# Patient Record
Sex: Male | Born: 1955 | Race: White | Hispanic: No | Marital: Single | Smoking: Never smoker
Health system: Southern US, Community
[De-identification: ages and names within clinical notes are randomized; demographics above are authoritative.]

## PROBLEM LIST (undated history)

## (undated) DIAGNOSIS — I255 Ischemic cardiomyopathy: Secondary | ICD-10-CM

## (undated) DIAGNOSIS — R319 Hematuria, unspecified: Secondary | ICD-10-CM

## (undated) DIAGNOSIS — I5022 Chronic systolic (congestive) heart failure: Secondary | ICD-10-CM

## (undated) DIAGNOSIS — R059 Cough, unspecified: Secondary | ICD-10-CM

## (undated) DIAGNOSIS — C801 Malignant (primary) neoplasm, unspecified: Secondary | ICD-10-CM

## (undated) DIAGNOSIS — I2510 Atherosclerotic heart disease of native coronary artery without angina pectoris: Secondary | ICD-10-CM

## (undated) DIAGNOSIS — E785 Hyperlipidemia, unspecified: Secondary | ICD-10-CM

## (undated) DIAGNOSIS — R05 Cough: Secondary | ICD-10-CM

## (undated) DIAGNOSIS — R911 Solitary pulmonary nodule: Secondary | ICD-10-CM

## (undated) HISTORY — PX: PORTACATH PLACEMENT: SHX2246

## (undated) HISTORY — PX: COLOSTOMY: SHX63

## (undated) HISTORY — PX: COLON SURGERY: SHX602

## (undated) HISTORY — DX: Malignant (primary) neoplasm, unspecified: C80.1

## (undated) HISTORY — DX: Cough: R05

## (undated) HISTORY — DX: Atherosclerotic heart disease of native coronary artery without angina pectoris: I25.10

## (undated) HISTORY — PX: OTHER SURGICAL HISTORY: SHX169

## (undated) HISTORY — DX: Hematuria, unspecified: R31.9

## (undated) HISTORY — PX: PROCTOSCOPY: SHX2266

## (undated) HISTORY — DX: Cough, unspecified: R05.9

## (undated) HISTORY — DX: Hyperlipidemia, unspecified: E78.5

---

## 2005-08-23 ENCOUNTER — Ambulatory Visit: Payer: Self-pay | Admitting: Cardiology

## 2005-09-06 ENCOUNTER — Ambulatory Visit: Payer: Self-pay

## 2005-10-03 ENCOUNTER — Encounter: Payer: Self-pay | Admitting: Cardiology

## 2005-10-03 ENCOUNTER — Ambulatory Visit: Payer: Self-pay

## 2005-10-10 ENCOUNTER — Ambulatory Visit: Payer: Self-pay | Admitting: Cardiology

## 2005-10-17 ENCOUNTER — Ambulatory Visit: Payer: Self-pay | Admitting: Cardiology

## 2010-02-15 ENCOUNTER — Inpatient Hospital Stay (HOSPITAL_COMMUNITY)
Admission: EM | Admit: 2010-02-15 | Discharge: 2010-03-01 | Payer: Self-pay | Source: Home / Self Care | Attending: Internal Medicine | Admitting: Internal Medicine

## 2010-02-15 ENCOUNTER — Ambulatory Visit: Payer: Self-pay | Admitting: Internal Medicine

## 2010-02-15 ENCOUNTER — Ambulatory Visit: Payer: Self-pay | Admitting: Cardiology

## 2010-02-17 ENCOUNTER — Encounter: Payer: Self-pay | Admitting: Internal Medicine

## 2010-02-18 ENCOUNTER — Ambulatory Visit: Payer: Self-pay | Admitting: Hematology & Oncology

## 2010-02-25 ENCOUNTER — Inpatient Hospital Stay (HOSPITAL_COMMUNITY)
Admission: EM | Admit: 2010-02-25 | Discharge: 2010-03-01 | Payer: Self-pay | Source: Home / Self Care | Attending: Internal Medicine | Admitting: Internal Medicine

## 2010-03-07 ENCOUNTER — Ambulatory Visit
Admission: RE | Admit: 2010-03-07 | Discharge: 2010-03-31 | Payer: Self-pay | Source: Home / Self Care | Attending: Radiation Oncology | Admitting: Radiation Oncology

## 2010-03-10 ENCOUNTER — Encounter: Payer: Self-pay | Admitting: Internal Medicine

## 2010-04-11 ENCOUNTER — Ambulatory Visit
Admission: RE | Admit: 2010-04-11 | Discharge: 2010-05-03 | Payer: Self-pay | Source: Home / Self Care | Attending: Radiation Oncology | Admitting: Radiation Oncology

## 2010-04-15 ENCOUNTER — Encounter: Payer: Self-pay | Admitting: Cardiology

## 2010-04-23 ENCOUNTER — Encounter: Payer: Self-pay | Admitting: Hematology & Oncology

## 2010-04-23 ENCOUNTER — Encounter: Payer: Self-pay | Admitting: Internal Medicine

## 2010-05-03 NOTE — Procedures (Signed)
Summary: Colonoscopy  Patient: Adam Henderson Note: All result statuses are Final unless otherwise noted.  Tests: (1) Colonoscopy (COL)   COL Colonoscopy           DONE     [facility]     [address]     [place], [place]  [address]           COLONOSCOPY PROCEDURE REPORT           PATIENT:  Adam Henderson, Adam Henderson  MR#:  [id number]     BIRTHDATE:  [birth year], 54 yrs. old  GENDER:  male     ENDOSCOPIST:  [name]. [name], MD     REF. BY:  [name], M.D.     PROCEDURE DATE:  02/17/2010     PROCEDURE:  Colonoscopy 27253     ASA CLASS:  Class III     INDICATIONS:  Iron deficiency anemia severe Iron deficieny, Hgb     3.0, heme positive stool     MEDICATIONS:   Versed 5 mg, Fentanyl 50 mcg           DESCRIPTION OF PROCEDURE:   After the risks benefits and     alternatives of the procedure were thoroughly explained, informed     consent was obtained.  Digital rectal exam was performed and     revealed rectal mass.   The EC-[id number] ([id number]) endoscope was     introduced through the anus and advanced to the cecum, which was     identified by both the appendix and ileocecal valve, without     limitations.  The quality of the prep was good, using MiraLax.     The instrument was then slowly withdrawn as the colon was fully     examined.     <<PROCEDUREIMAGES>>           FINDINGS:  A mass was found. large ulcerated circumferential mass,     covered with foul smelling exudate, extending from the rectum to     10 cm, no obstruction Multiple biopsies were obtained and sent to     pathology (see image004, image005, image007, image006, and     image008).  This was otherwise a normal examination of the colon.     normal ileocecal valve and colon mucosa Multiple biopsies were     obtained and sent to pathology (see image003, image002, and     image001). random colon biopsies, also biopsy ileocecal valve     Retroflexed views in the rectum revealed no other findings other     than  those already described.    The scope was then withdrawn from     the patient and the procedure completed.           COMPLICATIONS:  None     ENDOSCOPIC IMPRESSION:     1) Mass     2) Otherwise normal examination     3) No other findings other than those already described.     large ulcerated rectal mass c/w carcinoma, no obstruction     RECOMMENDATIONS:     1) Await biopsy results     see chatr for plan of care, CT scan of the abd/pelvis,     Onc/Radiation consult     REPEAT EXAM:  In 1 year(s) for.           ______________________________     [name]. [name], MD           CC:  n.     eSIGNED:   [name]. Brodie at 02/17/2010 06:47 PM           Shearon Balo, [id number]  Note: An exclamation mark (!) indicates a result that was not dispersed into the flowsheet. Document Creation Date: 02/17/2010 6:48 PM _______________________________________________________________________  (1) Order result status: Final Collection or observation date-time: 02/17/2010 18:41 Requested date-time:  Receipt date-time:  Reported date-time:  Referring Physician:   Ordering Physician: Lina Sar [phone]) Specimen Source:  Source: Launa Grill Order Number: [id number] Lab site:

## 2010-05-03 NOTE — Procedures (Signed)
Summary: Upper Endoscopy  Patient: Adam Henderson Note: All result statuses are Final unless otherwise noted.  Tests: (1) Upper Endoscopy (EGD)   EGD Upper Endoscopy       DONE     [facility]     [address]     [place], [place]  [address]           ENDOSCOPY PROCEDURE REPORT           PATIENT:  Adam Henderson, Adam Henderson  MR#:  [id number]     BIRTHDATE:  11-03-1955, 54 yrs. old  GENDER:  male           ENDOSCOPIST:  [name]. [name], MD     Referred by:  [name], M.D.           PROCEDURE DATE:  02/17/2010     PROCEDURE:  EGD with biopsy, 43239     ASA CLASS:  Class III     INDICATIONS:  iron deficiency anemia, hemoccult positive stool Hgb     3.0, low serum Iron,           MEDICATIONS:   Versed 5 mg, Fentanyl 50 mcg     TOPICAL ANESTHETIC:  Cetacaine Spray           DESCRIPTION OF PROCEDURE:   After the risks benefits and     alternatives of the procedure were thoroughly explained, informed     consent was obtained.  The EG-[id number] ([id number]) and EC-3490Li     ([id number]) endoscope was introduced through the mouth and advanced     to the second portion of the duodenum, without limitations.  The     instrument was slowly withdrawn as the mucosa was fully examined.     <<PROCEDUREIMAGES>>           The upper, middle, and distal third of the esophagus were     carefully inspected and no abnormalities were noted. The z-line     was well seen at the GEJ. The endoscope was pushed into the fundus     which was normal including a retroflexed view. The antrum,gastric     body, first and second part of the duodenum were unremarkable (see     image002, image004, image005, and image006). small 1 cm sliding     hiatal hernia duodenal biopsies to r/o sprue  Esophagitis was     found in the distal esophagus (see image001). Candida esophagitis     Retroflexed views revealed no abnormalities.    The scope was then     withdrawn from the patient and the procedure completed.         COMPLICATIONS:  None           ENDOSCOPIC IMPRESSION:     1) Normal EGD     2) Esophagitis in the distal esophagus     Candida esophagitis, s/p small bowl biopsies,     RECOMMENDATIONS:     1) Await biopsy results     colonoscopy           REPEAT EXAM:  In 0 year(s) for.           ______________________________     [name]. [name], MD           CC:           n.     eSIGNED:   [name]. Brodie at 02/17/2010 06:40 PM  Shearon Balo, [id number]  Note: An exclamation mark (!) indicates a result that was not dispersed into the flowsheet. Document Creation Date: 02/17/2010 6:41 PM _______________________________________________________________________  (1) Order result status: Final Collection or observation date-time: 02/17/2010 18:02 Requested date-time:  Receipt date-time:  Reported date-time:  Referring Physician:   Ordering Physician: Lina Sar [phone]) Specimen Source:  Source: Launa Grill Order Number: [id number] Lab site:

## 2010-05-05 NOTE — Letter (Signed)
Summary: [facility]  [facility]   Imported By: Sherian Rein 04/21/2010 10:02:46  _____________________________________________________________________  External Attachment:    Type:   Image     Comment:   External Document

## 2010-05-06 DIAGNOSIS — D509 Iron deficiency anemia, unspecified: Secondary | ICD-10-CM | POA: Insufficient documentation

## 2010-05-06 DIAGNOSIS — E8809 Other disorders of plasma-protein metabolism, not elsewhere classified: Secondary | ICD-10-CM | POA: Insufficient documentation

## 2010-05-06 DIAGNOSIS — K900 Celiac disease: Secondary | ICD-10-CM | POA: Insufficient documentation

## 2010-05-06 DIAGNOSIS — I2510 Atherosclerotic heart disease of native coronary artery without angina pectoris: Secondary | ICD-10-CM | POA: Insufficient documentation

## 2010-05-09 ENCOUNTER — Encounter: Payer: Self-pay | Admitting: Cardiology

## 2010-05-09 ENCOUNTER — Encounter (INDEPENDENT_AMBULATORY_CARE_PROVIDER_SITE_OTHER): Payer: BC Managed Care – PPO | Admitting: Cardiology

## 2010-05-09 DIAGNOSIS — I2510 Atherosclerotic heart disease of native coronary artery without angina pectoris: Secondary | ICD-10-CM

## 2010-05-09 DIAGNOSIS — I428 Other cardiomyopathies: Secondary | ICD-10-CM

## 2010-05-19 NOTE — Assessment & Plan Note (Signed)
Summary: [facility]   Visit Type:  follow-up-from 2007  CC:  palpitations, headaeches, and some swelling in both legs.  History of Present Illness: 55 year old male I initially saw in 2007 for atypical chest pain. An echocardiogram revealed an ejection fraction of 25-35%. A myoview revealed an ejection fraction of 33%. Cardiac catheterization was recommended but the patient refused. The patient was admitted in November of 2011 with severe anemia. He was ultimately diagnosed with  probable stage III rectal cancer. He apparently had chest pain and underwent cardiac catheterization. This revealed a 95% marginal and the patient had a bare-metal stent. There was no other coronary disease noted. His ejection fraction was 20-25%. Echocardiogram in November of 2011 revealed an ejection fraction of 20-25%, grade 2 diastolic dysfunction, mild left atrial enlargement and mild mitral regurgitation. Since discharge, he denies dyspnea on exertion, orthopnea, PND, exertional chest pain or syncope. He occasionally has mild pedal edema. Apparently his systolic blood pressure has been in the 70s and 80s at times. He has hematochezia has resolved after completing radiation therapy. Apparently his aspirin was discontinued as he felt cold a lot.  Current Medications (verified): 1)  Xeloda 500 Mg Tabs (Capecitabine) .... 1500mg  Two Times A Day Mon - Friday 2)  Plavix 75 Mg Tabs (Clopidogrel Bisulfate) .... Take One Tablet By Mouth Daily 3)  Furosemide 20 Mg Tabs (Furosemide) .... Take One Tablet By Mouth Daily. 4)  Hydrocodone-Acetaminophen 5-325 Mg Tabs (Hydrocodone-Acetaminophen) .Marland Kitchen.. 1 Tab Every As Needed For Pain 5)  Lisinopril 2.5 Mg Tabs (Lisinopril) .... Take One Tablet By Mouth Daily 6)  Protonix 40 Mg Tbec (Pantoprazole Sodium) .... Once Daily 7)  Potassium Chloride Crys Cr 20 Meq Cr-Tabs (Potassium Chloride Crys Cr) .... Take One Tablet By Mouth Daily 8)  Vitamin B-12 100 Mcg Tabs (Cyanocobalamin) ....  Once Daily 9)  Prochlorperazine Maleate 10 Mg Tabs (Prochlorperazine Maleate) .... 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  Rectal ADENOCARCINOMA  HYPOALBUMINEMIA  CELIAC SPRUE  ANEMIA, IRON DEFICIENCY   Past Surgical History: No previous surgeries.        Social History: Reviewed history from 05/06/2010 and no changes required.  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  He denied   tobacco, alcohol or drug use.  He is not married and has no children..   Review of Systems       Complains of fatigue and cold intolerance at times but no fevers or chills, productive cough, hemoptysis, dysphasia, odynophagia, melena, hematochezia, dysuria, hematuria, rash, seizure activity, orthopnea, PND,  claudication. Remaining systems are negative.   Vital Signs:  Patient profile:   55 year old male Height:      69.5 inches Weight:      171.75 pounds BMI:     25.09 Pulse rate:   102 / minute BP sitting:   90 / 56  (right arm) Cuff size:   regular  Vitals Entered By: Caralee Ates CMA (May 09, 2010 4:28 PM)  Serial Vital Signs/Assessments:  Time      Position  BP       Pulse  Resp  Temp     By                     88/50  Deliah Goody, RN  Comments: left arm By: Deliah Goody, RN    Physical Exam  General:  Well-developed well-nourished in no acute distress.  Skin is warm and dry.  HEENT is normal.  Neck is supple. No thyromegaly.  Chest is clear to auscultation with normal expansion.  Cardiovascular exam is regular rate and rhythm.  Abdominal exam nontender or distended. No masses palpated. Extremities show no edema. neuro grossly intact    EKG  Procedure date:  05/09/2010  Findings:      Sinus  tachycardia at a rate of 101. Left axis deviation. No significant ST changes.  Impression & Recommendations:  Problem # 1:  OTHER PRIMARY CARDIOMYOPATHIES (ICD-425.4) Pt has a nonischemic cardiomyopathy out of proportion to coronary disease. Etiology is unclear. Previous TSH normal. However cardiomyopathy was documented in 2007 and is chronic. Plan continue Lasix. His blood pressure is low and apparently in the 70s and 80s at times. Discontinue lisinopril. We will add ACE inhibitor and beta blocker in the future if his blood pressure improves. Not a candidate for ICD with stage 3b rectal cancer. His updated medication list for this problem includes:    Furosemide 20 Mg Tabs (Furosemide) .Marland Kitchen... Take one tablet by mouth daily.    Aspirin 81 Mg Tbec (Aspirin) .Marland Kitchen... Take one tablet by mouth daily  Problem # 2:  CAD (ICD-414.00) Patient previously had a bare-metal stent. Plan resume aspirin at 81 mg p.o. daily. In 10 days discontinue Plavix. Add statin. We'll begin Pravachol 40 mg p.o. daily and check lipids and liver in 6 weeks. His updated medication list for this problem includes:    Aspirin 81 Mg Tbec (Aspirin) .Marland Kitchen... Take one tablet by mouth daily  Problem # 3:  ADENOCARCINOMA (ICD-199.1) Management per oncology.  Problem # 4:  CELIAC SPRUE (ICD-579.0) Management per GI.  Patient Instructions: 1)  Your physician recommends that you return for lab work in:6 WEEKS 2)  Your physician has recommended you make the following change in your medication: START ASPIRIN 81MG  ONCE DAILY=AFTER TAKING ASPIRIN FOR 10DAYS MAY STOP PLAVIX 3)  STOP LISINOPRIL 4)  START PRAVASTATIN 40MG  ONCE DAILY AT BEDTIME 5)  Your physician wants you to follow-up in:3 MONTHS   You will receive a reminder letter in the mail two months in advance. If you don't receive a letter, please call our office to schedule the follow-up appointment. Prescriptions: PRAVASTATIN SODIUM 40 MG TABS (PRAVASTATIN SODIUM) Take one tablet by  mouth daily at bedtime  #30 x 12   Entered by:   Deliah Goody, RN   Authorized by:   Ferman Hamming, MD, [facility]   Signed by:   Deliah Goody, RN on 05/09/2010   Method used:   Electronically to        [facility]  [address] (retail)       [address]       [place], [place]  [address]       Ph: [phone]       Fax: [phone]   RxID:   [id number]

## 2010-05-25 NOTE — Consult Note (Signed)
Summary: [facility]: Consultation Report  [facility]: Consultation Report   Imported By: Earl Many 05/20/2010 15:59:21  _____________________________________________________________________  External Attachment:    Type:   Image     Comment:   External Document

## 2010-06-03 DIAGNOSIS — I5022 Chronic systolic (congestive) heart failure: Secondary | ICD-10-CM

## 2010-06-14 LAB — TYPE AND SCREEN
Antibody Screen: NEGATIVE
Unit division: 0

## 2010-06-14 LAB — DIFFERENTIAL
Basophils Absolute: 0.1 10*3/uL (ref 0.0–0.1)
Basophils Relative: 1 % (ref 0–1)
Eosinophils Absolute: 0.1 10*3/uL (ref 0.0–0.7)
Monocytes Absolute: 1.2 10*3/uL — ABNORMAL HIGH (ref 0.1–1.0)
Neutro Abs: 7.6 10*3/uL (ref 1.7–7.7)
Neutrophils Relative %: 72 % (ref 43–77)

## 2010-06-14 LAB — URINALYSIS, ROUTINE W REFLEX MICROSCOPIC
Bilirubin Urine: NEGATIVE
Glucose, UA: NEGATIVE mg/dL
Hgb urine dipstick: NEGATIVE
Ketones, ur: NEGATIVE mg/dL
Nitrite: NEGATIVE
Nitrite: NEGATIVE
Protein, ur: NEGATIVE mg/dL
Specific Gravity, Urine: 1.017 (ref 1.005–1.030)
Urobilinogen, UA: 1 mg/dL (ref 0.0–1.0)
pH: 5.5 (ref 5.0–8.0)

## 2010-06-14 LAB — CBC
HCT: 15.4 % — ABNORMAL LOW (ref 39.0–52.0)
HCT: 24.2 % — ABNORMAL LOW (ref 39.0–52.0)
HCT: 24.3 % — ABNORMAL LOW (ref 39.0–52.0)
HCT: 25.6 % — ABNORMAL LOW (ref 39.0–52.0)
HCT: 27.3 % — ABNORMAL LOW (ref 39.0–52.0)
HCT: 27.4 % — ABNORMAL LOW (ref 39.0–52.0)
HCT: 29.1 % — ABNORMAL LOW (ref 39.0–52.0)
HCT: 30.5 % — ABNORMAL LOW (ref 39.0–52.0)
Hemoglobin: 3.4 g/dL — CL (ref 13.0–17.0)
Hemoglobin: 6.9 g/dL — CL (ref 13.0–17.0)
Hemoglobin: 7.5 g/dL — ABNORMAL LOW (ref 13.0–17.0)
Hemoglobin: 7.7 g/dL — ABNORMAL LOW (ref 13.0–17.0)
Hemoglobin: 8.5 g/dL — ABNORMAL LOW (ref 13.0–17.0)
Hemoglobin: 8.5 g/dL — ABNORMAL LOW (ref 13.0–17.0)
MCH: 12.3 pg — ABNORMAL LOW (ref 26.0–34.0)
MCH: 18.6 pg — ABNORMAL LOW (ref 26.0–34.0)
MCH: 18.9 pg — ABNORMAL LOW (ref 26.0–34.0)
MCH: 20.1 pg — ABNORMAL LOW (ref 26.0–34.0)
MCH: 20.2 pg — ABNORMAL LOW (ref 26.0–34.0)
MCH: 20.3 pg — ABNORMAL LOW (ref 26.0–34.0)
MCH: 23 pg — ABNORMAL LOW (ref 26.0–34.0)
MCH: 23.8 pg — ABNORMAL LOW (ref 26.0–34.0)
MCH: 25 pg — ABNORMAL LOW (ref 26.0–34.0)
MCHC: 27.4 g/dL — ABNORMAL LOW (ref 30.0–36.0)
MCHC: 27.7 g/dL — ABNORMAL LOW (ref 30.0–36.0)
MCHC: 28.7 g/dL — ABNORMAL LOW (ref 30.0–36.0)
MCHC: 31 g/dL (ref 30.0–36.0)
MCHC: 31.8 g/dL (ref 30.0–36.0)
MCV: 55.6 fL — ABNORMAL LOW (ref 78.0–100.0)
MCV: 65.7 fL — ABNORMAL LOW (ref 78.0–100.0)
MCV: 67 fL — ABNORMAL LOW (ref 78.0–100.0)
MCV: 67.5 fL — ABNORMAL LOW (ref 78.0–100.0)
MCV: 70.3 fL — ABNORMAL LOW (ref 78.0–100.0)
MCV: 72.3 fL — ABNORMAL LOW (ref 78.0–100.0)
MCV: 74 fL — ABNORMAL LOW (ref 78.0–100.0)
MCV: 74.2 fL — ABNORMAL LOW (ref 78.0–100.0)
Platelets: 300 10*3/uL (ref 150–400)
Platelets: 334 10*3/uL (ref 150–400)
Platelets: 345 10*3/uL (ref 150–400)
Platelets: 365 10*3/uL (ref 150–400)
Platelets: 366 10*3/uL (ref 150–400)
Platelets: 369 10*3/uL (ref 150–400)
Platelets: 390 10*3/uL (ref 150–400)
Platelets: 413 10*3/uL — ABNORMAL HIGH (ref 150–400)
Platelets: 520 10*3/uL — ABNORMAL HIGH (ref 150–400)
RBC: 2.77 MIL/uL — ABNORMAL LOW (ref 4.22–5.81)
RBC: 3.22 MIL/uL — ABNORMAL LOW (ref 4.22–5.81)
RBC: 3.68 MIL/uL — ABNORMAL LOW (ref 4.22–5.81)
RBC: 3.69 MIL/uL — ABNORMAL LOW (ref 4.22–5.81)
RBC: 3.7 MIL/uL — ABNORMAL LOW (ref 4.22–5.81)
RBC: 3.84 MIL/uL — ABNORMAL LOW (ref 4.22–5.81)
RBC: 3.96 MIL/uL — ABNORMAL LOW (ref 4.22–5.81)
RBC: 4.11 MIL/uL — ABNORMAL LOW (ref 4.22–5.81)
RBC: 4.31 MIL/uL (ref 4.22–5.81)
RDW: 31.7 % — ABNORMAL HIGH (ref 11.5–15.5)
RDW: 33.7 % — ABNORMAL HIGH (ref 11.5–15.5)
WBC: 10.4 10*3/uL (ref 4.0–10.5)
WBC: 10.6 10*3/uL — ABNORMAL HIGH (ref 4.0–10.5)
WBC: 6.7 10*3/uL (ref 4.0–10.5)
WBC: 6.9 10*3/uL (ref 4.0–10.5)
WBC: 7 10*3/uL (ref 4.0–10.5)
WBC: 7 10*3/uL (ref 4.0–10.5)
WBC: 8.1 10*3/uL (ref 4.0–10.5)
WBC: 8.5 10*3/uL (ref 4.0–10.5)

## 2010-06-14 LAB — BASIC METABOLIC PANEL
BUN: 4 mg/dL — ABNORMAL LOW (ref 6–23)
BUN: 8 mg/dL (ref 6–23)
BUN: 9 mg/dL (ref 6–23)
CO2: 26 mEq/L (ref 19–32)
CO2: 27 mEq/L (ref 19–32)
CO2: 29 mEq/L (ref 19–32)
Calcium: 7.7 mg/dL — ABNORMAL LOW (ref 8.4–10.5)
Calcium: 7.8 mg/dL — ABNORMAL LOW (ref 8.4–10.5)
Calcium: 7.8 mg/dL — ABNORMAL LOW (ref 8.4–10.5)
Calcium: 8.1 mg/dL — ABNORMAL LOW (ref 8.4–10.5)
Calcium: 8.4 mg/dL (ref 8.4–10.5)
Calcium: 8.5 mg/dL (ref 8.4–10.5)
Chloride: 104 mEq/L (ref 96–112)
Chloride: 106 mEq/L (ref 96–112)
Chloride: 107 mEq/L (ref 96–112)
Creatinine, Ser: 0.62 mg/dL (ref 0.4–1.5)
Creatinine, Ser: 0.77 mg/dL (ref 0.4–1.5)
Creatinine, Ser: 0.85 mg/dL (ref 0.4–1.5)
Creatinine, Ser: 0.86 mg/dL (ref 0.4–1.5)
GFR calc Af Amer: >60 mL/min (ref >60)
GFR calc Af Amer: >60 mL/min (ref >60)
GFR calc Af Amer: >60 mL/min (ref >60)
GFR calc Af Amer: >60 mL/min (ref >60)
GFR calc Af Amer: >60 mL/min (ref >60)
GFR calc non Af Amer: >60 mL/min (ref >60)
GFR calc non Af Amer: >60 mL/min (ref >60)
GFR calc non Af Amer: >60 mL/min (ref >60)
Glucose, Bld: 111 mg/dL — ABNORMAL HIGH (ref 70–99)
Potassium: 4.1 mEq/L (ref 3.5–5.1)
Potassium: 4.7 mEq/L (ref 3.5–5.1)
Sodium: 138 mEq/L (ref 135–145)
Sodium: 139 mEq/L (ref 135–145)
Sodium: 140 mEq/L (ref 135–145)

## 2010-06-14 LAB — COMPREHENSIVE METABOLIC PANEL
ALT: 10 U/L (ref 0–53)
ALT: 9 U/L (ref 0–53)
AST: 19 U/L (ref 0–37)
AST: 20 U/L (ref 0–37)
Albumin: 2.3 g/dL — ABNORMAL LOW (ref 3.5–5.2)
Albumin: 2.6 g/dL — ABNORMAL LOW (ref 3.5–5.2)
Alkaline Phosphatase: 70 U/L (ref 39–117)
BUN: 17 mg/dL (ref 6–23)
BUN: 9 mg/dL (ref 6–23)
CO2: 24 mEq/L (ref 19–32)
CO2: 25 mEq/L (ref 19–32)
CO2: 28 mEq/L (ref 19–32)
Calcium: 7.9 mg/dL — ABNORMAL LOW (ref 8.4–10.5)
Calcium: 8.1 mg/dL — ABNORMAL LOW (ref 8.4–10.5)
Calcium: 8.3 mg/dL — ABNORMAL LOW (ref 8.4–10.5)
Calcium: 8.4 mg/dL (ref 8.4–10.5)
Chloride: 102 mEq/L (ref 96–112)
Creatinine, Ser: 0.78 mg/dL (ref 0.4–1.5)
Creatinine, Ser: 0.8 mg/dL (ref 0.4–1.5)
Creatinine, Ser: 0.87 mg/dL (ref 0.4–1.5)
GFR calc Af Amer: >60 mL/min (ref >60)
GFR calc Af Amer: >60 mL/min (ref >60)
GFR calc Af Amer: >60 mL/min (ref >60)
GFR calc non Af Amer: >60 mL/min (ref >60)
GFR calc non Af Amer: >60 mL/min (ref >60)
GFR calc non Af Amer: >60 mL/min (ref >60)
Glucose, Bld: 134 mg/dL — ABNORMAL HIGH (ref 70–99)
Glucose, Bld: 95 mg/dL (ref 70–99)
Glucose, Bld: 95 mg/dL (ref 70–99)
Potassium: 5.1 mEq/L (ref 3.5–5.1)
Sodium: 133 mEq/L — ABNORMAL LOW (ref 135–145)
Sodium: 139 mEq/L (ref 135–145)
Total Bilirubin: 0.8 mg/dL (ref 0.3–1.2)
Total Protein: 6 g/dL (ref 6.0–8.3)
Total Protein: 6 g/dL (ref 6.0–8.3)
Total Protein: 6.5 g/dL (ref 6.0–8.3)
Total Protein: 7.2 g/dL (ref 6.0–8.3)

## 2010-06-14 LAB — CROSSMATCH
ABO/RH(D): O POS
ABO/RH(D): O POS
Antibody Screen: NEGATIVE
Unit division: 0
Unit division: 0
Unit division: 0
Unit division: 0

## 2010-06-14 LAB — RENAL FUNCTION PANEL
Albumin: 2.1 g/dL — ABNORMAL LOW (ref 3.5–5.2)
BUN: 5 mg/dL — ABNORMAL LOW (ref 6–23)
Chloride: 103 mEq/L (ref 96–112)
GFR calc non Af Amer: >60 mL/min (ref >60)
Phosphorus: 3.8 mg/dL (ref 2.3–4.6)
Potassium: 3.8 mEq/L (ref 3.5–5.1)
Sodium: 137 mEq/L (ref 135–145)

## 2010-06-14 LAB — HEMOCCULT GUIAC POC 1CARD (OFFICE): Fecal Occult Bld: NEGATIVE

## 2010-06-14 LAB — PROTIME-INR
INR: 1.24 (ref 0.00–1.49)
Prothrombin Time: 15.8 seconds — ABNORMAL HIGH (ref 11.6–15.2)
Prothrombin Time: 17.8 seconds — ABNORMAL HIGH (ref 11.6–15.2)

## 2010-06-14 LAB — ABO/RH: ABO/RH(D): O POS

## 2010-06-14 LAB — CARDIAC PANEL(CRET KIN+CKTOT+MB+TROPI)
CK, MB: 1.2 ng/mL (ref 0.3–4.0)
Relative Index: INVALID (ref 0.0–2.5)
Troponin I: 0.02 ng/mL (ref 0.00–0.06)
Troponin I: 0.05 ng/mL (ref 0.00–0.06)
Troponin I: 0.05 ng/mL (ref 0.00–0.06)

## 2010-06-14 LAB — MAGNESIUM: Magnesium: 2.2 mg/dL (ref 1.5–2.5)

## 2010-06-14 LAB — PREPARE RBC (CROSSMATCH)

## 2010-06-14 LAB — IRON AND TIBC: UIBC: 342 ug/dL

## 2010-06-14 LAB — HEMOGLOBIN AND HEMATOCRIT, BLOOD: Hemoglobin: 5.1 g/dL — CL (ref 13.0–17.0)

## 2010-06-14 LAB — CEA: CEA: 22.4 ng/mL — ABNORMAL HIGH (ref 0.0–5.0)

## 2010-06-14 LAB — RETICULOCYTES
RBC.: 3.15 MIL/uL — ABNORMAL LOW (ref 4.22–5.81)
Retic Ct Pct: 1.1 % (ref 0.4–3.1)

## 2010-06-14 LAB — GLUCOSE, CAPILLARY: Glucose-Capillary: 105 mg/dL — ABNORMAL HIGH (ref 70–99)

## 2010-06-14 LAB — POTASSIUM: Potassium: 3.6 mEq/L (ref 3.5–5.1)

## 2010-06-15 ENCOUNTER — Telehealth: Payer: Self-pay | Admitting: Cardiology

## 2010-06-16 ENCOUNTER — Encounter: Payer: Self-pay | Admitting: Cardiology

## 2010-06-17 ENCOUNTER — Other Ambulatory Visit (HOSPITAL_COMMUNITY): Payer: Self-pay | Admitting: General Surgery

## 2010-06-17 DIAGNOSIS — C20 Malignant neoplasm of rectum: Secondary | ICD-10-CM

## 2010-06-21 NOTE — Progress Notes (Signed)
  Phone Note Outgoing Call   Summary of Call: I received a call from oncology concerning Adam Henderson and his risk of surgery for rectal cancer. Adam Henderson had a bare-metal stent placed on February 23, 2010. He had no chest pain when I saw him last. There was no other disease noted. He did have a cardiomyopathy as well and is being treated medically. I think he can proceed with surgery. Given that we are greater than 3 months status post stent his Plavix could be held. I would like to continue aspirin 81 mg daily if possible. He would also continue his remaining cardiac medications. He would need to be observed closely for volume excess postoperatively. I relayed this message to Dr. Bertha Stakes office today. Ferman Hamming, MD, [facility]  June 15, 2010 8:33 AM      Appended Document:  faxed to cindy at dr ingram's office-[phone].

## 2010-06-22 ENCOUNTER — Other Ambulatory Visit (HOSPITAL_COMMUNITY): Payer: BC Managed Care – PPO

## 2010-06-22 ENCOUNTER — Ambulatory Visit (HOSPITAL_COMMUNITY)
Admission: RE | Admit: 2010-06-22 | Discharge: 2010-06-22 | Disposition: A | Discharge status: Home / Self Care | Payer: BC Managed Care – PPO | Source: Ambulatory Visit | Attending: General Surgery | Admitting: General Surgery

## 2010-06-22 DIAGNOSIS — C20 Malignant neoplasm of rectum: Secondary | ICD-10-CM | POA: Insufficient documentation

## 2010-06-22 DIAGNOSIS — R599 Enlarged lymph nodes, unspecified: Secondary | ICD-10-CM | POA: Insufficient documentation

## 2010-06-22 MED ORDER — GADOBENATE DIMEGLUMINE 529 MG/ML IV SOLN
15.0000 mL | Freq: Once | INTRAVENOUS | Status: AC | PRN
  Administered 2010-06-22: 15 mL via INTRAVENOUS

## 2010-07-07 ENCOUNTER — Other Ambulatory Visit (HOSPITAL_COMMUNITY): Payer: Self-pay | Admitting: General Surgery

## 2010-07-07 ENCOUNTER — Ambulatory Visit (HOSPITAL_COMMUNITY)
Admission: RE | Admit: 2010-07-07 | Discharge: 2010-07-07 | Disposition: A | Discharge status: Home / Self Care | Payer: BC Managed Care – PPO | Source: Ambulatory Visit | Attending: General Surgery | Admitting: General Surgery

## 2010-07-07 ENCOUNTER — Encounter (HOSPITAL_COMMUNITY)
Admission: RE | Admit: 2010-07-07 | Discharge: 2010-07-07 | Disposition: A | Discharge status: Home / Self Care | Payer: BC Managed Care – PPO | Source: Ambulatory Visit | Attending: General Surgery | Admitting: General Surgery

## 2010-07-07 DIAGNOSIS — Z01818 Encounter for other preprocedural examination: Secondary | ICD-10-CM | POA: Insufficient documentation

## 2010-07-07 DIAGNOSIS — Z01810 Encounter for preprocedural cardiovascular examination: Secondary | ICD-10-CM | POA: Insufficient documentation

## 2010-07-07 DIAGNOSIS — C20 Malignant neoplasm of rectum: Secondary | ICD-10-CM

## 2010-07-07 DIAGNOSIS — Z01812 Encounter for preprocedural laboratory examination: Secondary | ICD-10-CM | POA: Insufficient documentation

## 2010-07-07 LAB — URINALYSIS, ROUTINE W REFLEX MICROSCOPIC
Bilirubin Urine: NEGATIVE
Glucose, UA: NEGATIVE mg/dL
Ketones, ur: NEGATIVE mg/dL
Nitrite: POSITIVE — AB
Protein, ur: NEGATIVE mg/dL
Specific Gravity, Urine: 1.011 (ref 1.005–1.030)
Urobilinogen, UA: 0.2 mg/dL (ref 0.0–1.0)
pH: 5.5 (ref 5.0–8.0)

## 2010-07-07 LAB — COMPREHENSIVE METABOLIC PANEL WITH GFR
ALT: 11 U/L (ref 0–53)
AST: 14 U/L (ref 0–37)
Albumin: 3.1 g/dL — ABNORMAL LOW (ref 3.5–5.2)
Alkaline Phosphatase: 73 U/L (ref 39–117)
BUN: 8 mg/dL (ref 6–23)
CO2: 28 meq/L (ref 19–32)
Calcium: 9 mg/dL (ref 8.4–10.5)
Chloride: 99 meq/L (ref 96–112)
Creatinine, Ser: 0.72 mg/dL (ref 0.4–1.5)
GFR calc Af Amer: >60 mL/min (ref >60)
GFR calc non Af Amer: >60 mL/min (ref >60)
Glucose, Bld: 116 mg/dL — ABNORMAL HIGH (ref 70–99)
Potassium: 4.6 meq/L (ref 3.5–5.1)
Sodium: 136 meq/L (ref 135–145)
Total Bilirubin: 0.3 mg/dL (ref 0.3–1.2)
Total Protein: 7.2 g/dL (ref 6.0–8.3)

## 2010-07-07 LAB — DIFFERENTIAL
Eosinophils Absolute: 0.1 10*3/uL (ref 0.0–0.7)
Eosinophils Relative: 2 % (ref 0–5)
Lymphs Abs: 0.7 10*3/uL (ref 0.7–4.0)
Monocytes Absolute: 0.7 10*3/uL (ref 0.1–1.0)
Monocytes Relative: 11 % (ref 3–12)

## 2010-07-07 LAB — SURGICAL PCR SCREEN
MRSA, PCR: NEGATIVE
Staphylococcus aureus: POSITIVE — AB

## 2010-07-07 LAB — CBC
MCH: 29.6 pg (ref 26.0–34.0)
MCV: 91.4 fL (ref 78.0–100.0)
Platelets: 342 10*3/uL (ref 150–400)
RBC: 3.72 MIL/uL — ABNORMAL LOW (ref 4.22–5.81)

## 2010-07-07 LAB — PROTIME-INR
INR: 1.15 (ref 0.00–1.49)
Prothrombin Time: 14.9 s (ref 11.6–15.2)

## 2010-07-07 LAB — URINE MICROSCOPIC-ADD ON

## 2010-07-13 ENCOUNTER — Inpatient Hospital Stay (HOSPITAL_COMMUNITY): Payer: BC Managed Care – PPO

## 2010-07-13 ENCOUNTER — Other Ambulatory Visit: Payer: Self-pay | Admitting: General Surgery

## 2010-07-13 ENCOUNTER — Inpatient Hospital Stay (HOSPITAL_COMMUNITY)
Admission: RE | Admit: 2010-07-13 | Discharge: 2010-07-22 | DRG: 146 | Disposition: A | Discharge status: Home / Self Care | Payer: BC Managed Care – PPO | Source: Ambulatory Visit | Attending: General Surgery | Admitting: General Surgery

## 2010-07-13 DIAGNOSIS — IMO0002 Reserved for concepts with insufficient information to code with codable children: Secondary | ICD-10-CM | POA: Diagnosis not present

## 2010-07-13 DIAGNOSIS — I10 Essential (primary) hypertension: Secondary | ICD-10-CM | POA: Diagnosis present

## 2010-07-13 DIAGNOSIS — I2510 Atherosclerotic heart disease of native coronary artery without angina pectoris: Secondary | ICD-10-CM | POA: Diagnosis present

## 2010-07-13 DIAGNOSIS — E785 Hyperlipidemia, unspecified: Secondary | ICD-10-CM | POA: Diagnosis present

## 2010-07-13 DIAGNOSIS — N400 Enlarged prostate without lower urinary tract symptoms: Secondary | ICD-10-CM | POA: Diagnosis present

## 2010-07-13 DIAGNOSIS — I428 Other cardiomyopathies: Secondary | ICD-10-CM

## 2010-07-13 DIAGNOSIS — C20 Malignant neoplasm of rectum: Principal | ICD-10-CM | POA: Diagnosis present

## 2010-07-13 DIAGNOSIS — K5641 Fecal impaction: Secondary | ICD-10-CM | POA: Diagnosis present

## 2010-07-13 DIAGNOSIS — D62 Acute posthemorrhagic anemia: Secondary | ICD-10-CM | POA: Diagnosis not present

## 2010-07-13 DIAGNOSIS — I252 Old myocardial infarction: Secondary | ICD-10-CM

## 2010-07-13 LAB — BASIC METABOLIC PANEL
Calcium: 8.1 mg/dL — ABNORMAL LOW (ref 8.4–10.5)
Creatinine, Ser: 0.82 mg/dL (ref 0.4–1.5)
GFR calc Af Amer: >60 mL/min (ref >60)
GFR calc non Af Amer: >60 mL/min (ref >60)
Glucose, Bld: 181 mg/dL — ABNORMAL HIGH (ref 70–99)
Sodium: 135 mEq/L (ref 135–145)

## 2010-07-13 LAB — CARDIAC PANEL(CRET KIN+CKTOT+MB+TROPI)
Relative Index: INVALID (ref 0.0–2.5)
Total CK: 82 U/L (ref 7–232)

## 2010-07-13 LAB — CBC
MCHC: 33.4 g/dL (ref 30.0–36.0)
RDW: 14.4 % (ref 11.5–15.5)

## 2010-07-14 LAB — CROSSMATCH
Antibody Screen: NEGATIVE
Unit division: 0

## 2010-07-14 LAB — BASIC METABOLIC PANEL
BUN: 14 mg/dL (ref 6–23)
CO2: 24 mEq/L (ref 19–32)
Calcium: 7.9 mg/dL — ABNORMAL LOW (ref 8.4–10.5)
Creatinine, Ser: 1.36 mg/dL (ref 0.4–1.5)
GFR calc Af Amer: >60 mL/min (ref >60)
Glucose, Bld: 184 mg/dL — ABNORMAL HIGH (ref 70–99)

## 2010-07-14 LAB — BRAIN NATRIURETIC PEPTIDE: Pro B Natriuretic peptide (BNP): 70 pg/mL (ref 0.0–100.0)

## 2010-07-14 LAB — URINE CULTURE: Culture: NO GROWTH

## 2010-07-14 LAB — CBC
Hemoglobin: 11.7 g/dL — ABNORMAL LOW (ref 13.0–17.0)
MCH: 29.5 pg (ref 26.0–34.0)
MCHC: 33.5 g/dL (ref 30.0–36.0)
MCV: 87.9 fL (ref 78.0–100.0)

## 2010-07-15 LAB — BASIC METABOLIC PANEL
CO2: 26 mEq/L (ref 19–32)
Chloride: 101 mEq/L (ref 96–112)
GFR calc Af Amer: >60 mL/min (ref >60)
Potassium: 4.1 mEq/L (ref 3.5–5.1)
Sodium: 132 mEq/L — ABNORMAL LOW (ref 135–145)

## 2010-07-15 LAB — CBC
Hemoglobin: 9.2 g/dL — ABNORMAL LOW (ref 13.0–17.0)
Platelets: 353 10*3/uL (ref 150–400)
RBC: 3.12 MIL/uL — ABNORMAL LOW (ref 4.22–5.81)
WBC: 24.4 10*3/uL — ABNORMAL HIGH (ref 4.0–10.5)

## 2010-07-15 NOTE — Op Note (Signed)
Adam Henderson, Adam Henderson                 ACCOUNT NO.:  [id number]  MEDICAL RECORD NO.:  [id number]           PATIENT TYPE:  I  LOCATION:  2307                         FACILITY:  [facility]  PHYSICIAN:  [name]. [name], M.D.DATE OF BIRTH:  [birth year]  DATE OF PROCEDURE:  07/13/2010 DATE OF DISCHARGE:                              OPERATIVE REPORT   PREOPERATIVE DIAGNOSES: 1. 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 2. Ischemic cardiomyopathy and coronary artery disease.  POSTOPERATIVE DIAGNOSES: 1. 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 2. Ischemic cardiomyopathy and coronary artery disease. 3. Rectosigmoid fecal impaction.  OPERATION PERFORMED: 1. Rigid proctoscopy with disimpaction. 2. Abdominal and perineal resection of rectal cancer. 3. Insertion of bilateral ureteral stents. 4. Closure of urethra (Dr. Brunilda Payor).  SURGEON:  [name]. [name], MD  FIRST ASSISTANT:  Anselm Pancoast. Zachery Dakins, MD  UROLOGY CONSULTLindaann Slough, MD  OPERATIVE INDICATIONS:  This is a 55 year old Caucasian man who apparently has had change in his bowel habits for over a year but denied seeing blood.  He presented to [facility] in November 2011 with a hemoglobin of 3.2 and an acute myocardial infarction.  Following transfusion and cardiac catheterization and coronary artery stenting, he underwent colonoscopy which showed a large ulcerated mass extending from the distal rectum to about 10 cm.  Biopsy showed adenocarcinoma. Recent echocardiogram shows an ejection fraction of 20-25%.  He underwent neoadjuvant chemotherapy and neoadjuvant radiation therapy under the guidance of [name] as well as Dr. Antony Blackbird up in [place].  The patient was referred to me on June 16, 2010, which was about 7 weeks following completion of his radiation therapy.  We completed his cardiac clearance and postop staging with MRI.  The patient is  felt to have a locally advanced tumor being at least stage III, T3, N2b. He is not a candidate for a sphincter sparing procedure due to the low level of the tumor distal margin and the size and stage of the tumor  .He was counseled as an outpatient.  He took a bowel prep at home yesterday and apparently did not completely evacuate everything. He did not have any vomiting.  He is brought to the operating room electively.  OPERATIVE FINDINGS:  First, the patient had a fecal impaction and I had to spend about 15 minutes proctoscopically evacuating all of the of solid stool in his rectum.  During the case, he also had hard stool balls up in his sigmoid colon.  The tumor was large.  The post radiation fibrosis was fairly intense and had obliterated most of the tissue planes.  The tumor appeared to have invaded or was at least very densely adherent to the posterior wall of the prostate.  There were palpably enlarged nodes in the mesorectum.  The liver felt normal.  The proximal small bowel and colon felt normal.  OPERATIVE TECHNIQUE:  Following induction of general endotracheal anesthesia, Dr. Brunilda Payor performed cystoscopy, placement of bilateral ureteral stents, and a Foley catheter.  He noticed that there was edema of the bladder wall  in the trigone.  I then performed a rigid proctoscopy and could see the tumor as was outlined preoperatively, but he had a huge amount of solid stool in the rectum and distal sigmoid which we washed out repeatedly and suctioned out as best as possible. It looked like we got all we could get out and I was able to get the proctoscope up to about 22 cm.  We then prepped the abdomen and perineum in a sterile fashion. Intravenous antibiotics had been given immediately preop.  Surgical time- out was held identifying correct patient and correct procedure.  A lower midline incision was made.  The abdomen was entered and explored with findings as described above.  The  most notable finding was no evidence of any metastatic disease, but the tumor appeared to be densely adherent to the posterior aspect of the bladder and prostate and there was a lot of hard stool in the sigmoid colon.  We milked some of the stool proximally and divided the distal sigmoid with a GIA stapling device.  We took down the mesenteric vessels as well as the superior hemorrhoidal vessels by using the LigaSure.  Larger vessels were suture ligated with 2-0 silk suture ligatures and doubly ligated.  We incised the peritoneum on the right and the left of the rectal mesentery all the way down into the deep pelvis.  We could palpate the ureters on each side.  We entered the presacral space in the avascular plane and lifted the rectum up off the sacrum and dissected all the way down to the level of the coccyx.  Lateral vascular pedicles were divided with the LigaSure device or sometimes electrocautery.  As we got down behind the bladder, the dissection became very difficult. We actually made a hole in the anterior wall of the rectum and there was a lot of stool there that we had to suction out because of the poor prep.  We continued the peritoneal incision up and around the anterior wall, but the dissection became very very difficult and it was very very difficult to see behind the bladder and prostate.  We got as far as we could and I then went below.  Prior to prepping and draping, I had placed a pursestring suture of 0 silk in the anorectal junction.  I made a sagittally oriented elliptical incision in the skin around the anus and rectum.  Dissection was carried down through subcutaneous tissue.  I then went through the superficial and deep layers of the pelvic floor muscles.  This became very difficult laterally and anteriorly.  We slowly took the dissection up and around.  I was able to eventually dissect posteriorly and communicate with the preperitoneal space.  That allowed me  to go on the left side and the right side dividing the pelvic floor.  It still became very difficult anteriorly because of the intense fibrosis.  We took the dissection up anteriorly with Dr. Zachery Dakins feeling above and me feeling below and continued to slowly dissect very carefully, although the tissue planes were very obscure.  As the process of dissecting the anterior rectum, we made entry into the urethra between the prostate and penile urethra.  We could visualize the Foley and the 2 stents this point.  At this point, we called Dr. Su Grand to come back to the operating room.  We discussed the anatomical situation.  We then came back down and dissected the prostate off the anterior wall of the rectum, although there really  was not a good dissection plane.  The tumor had involved the prostate and we had to simply incise the rectum off of the posterior wall of the prostate.  It is possible that we left tumor behind on the posterior wall of the prostate.  We then pulled the proximal rectum down through the perineal wound and completed the dissection and removed the specimen.  At this point, Dr. Brunilda Payor repaired the urethra between the prostate and the penile urethra with Vicryl sutures.  He will dictate that separately.  Hemostasis in the perineal wound was good.  We had lost a moderate amount of blood, but we had good hemostasis.  We irrigated out the wound.  I placed two 19-French Blake drains into the presacral space and brought one out on the right and one on the left gluteal areas and sutured to the skin with nylon sutures and connected to suction bulbs. I closed the perineal wound in about 4 layers, three layers of 0 Vicryl closing the pelvic floor in the subcutaneous tissue and then a few skin sutures of 3-0 nylon.  This completed the perineal dissection.  I then changed my gloves and gown and came back up to the abdominal wound.  There was a large amount of hard stool in the  sigmoid and so I chose to milk all of the hard stool down distally into the distal sigmoid stump and I resected about 6 more inches of sigmoid with a GIA stapling device and removed that from the field.  This left plenty of length proximal sigmoid and descending colon which would easily come up for a colostomy.  We then irrigated the abdomen and pelvis with several liters of saline.  We closed the perineum and the pelvis partially with a running suture of 2-0 Vicryl.  It really would not close all the way. We returned the small bowel and omentum to their anatomic positions.  I selected an appropriate place on the left abdominal wall about at the level or slightly above the umbilicus.  I then cut out a circular button of skin.  I debrided subcutaneous fat.  The anterior rectus sheath was incised in a cruciate fashion.  We had one arterial bleeder in the rectus muscle which was controlled with electrocautery.  We then incised the posterior rectus sheath and dilated the colostomy aperture to where it would easily admit 2 fingers.  I very carefully positioned the proximal sigmoid colon and brought it through the abdominal wound.  I made sure there was no twisting.  I sutured the colon to the peritoneum with 3 interrupted sutures of 3-0 silk.  We irrigated the abdomen and pelvis out one more time.  We checked the positions of the drains and the omentum.  The midline fascia was closed with a running suture of #1 double-stranded PDS and several interrupted sutures of #1 Novafil.  The skin was closed loosely with skin staples. Clean bandages were placed.  A colostomy bag was placed.  The ureteral stents were removed, but the Foley was left in place.  The patient tolerated the procedure well and was taken to recovery room in stable condition.  Estimated blood loss was about 550 mL.  Complications none. Sponge, needle, and instrument counts were correct.     [name]. [name],  M.D.     HMI/MEDQ  D:  07/13/2010  T:  07/14/2010  Job:  [id number]  cc:   Lebron Conners. Darovsky, M.D. Billie Lade, Ph.D., M.D. Ernestina Penna,  M.D. [name]. [name], MD, [facility] [name]. [name], MD  Electronically Signed by Claud Kelp M.D. on 07/15/2010 10:28:09 AM

## 2010-07-15 NOTE — Consult Note (Signed)
NAMEGIULIANO, Henderson                 ACCOUNT NO.:  [id number]  MEDICAL RECORD NO.:  [id number]           PATIENT TYPE:  I  LOCATION:  2307                         FACILITY:  [facility]  PHYSICIAN:  Adam Pick. Eden Emms, MD, FACCDATE OF BIRTH:  [birth year]  DATE OF CONSULTATION: DATE OF DISCHARGE:                                CONSULTATION   Adam Henderson is an unfortunate 55 year old patient with stage IIIB rectal cancer.  We are asked to see in the PACU postoperatively.  He has a history of coronary artery disease with mixed cardiomyopathy.  He had a stent placed to the obtuse marginal branch February 23, 2010, it was a bare-metal stent since he presented to the hospital with a hemoglobin of 3 and a subendocardial MI.  His LV dysfunction is disproportionate to coronary artery disease in the 25% range.  He had adjuvant therapy and a Port-A-Cath placed.  He is status post resection of rectal cancer with insertion of bilateral ureteral catheters.  Apparently, there was some blood loss during the case and the patient received 2 units of blood. His preop hemoglobin had improved to 11.  He has abdominal pain.  He is not complaining of dyspnea.  Chest x-ray is pending.  He has no chest pain.  Hemodynamics are stable.  He has been off his aspirin and Plavix.  Initially, a bare-metal stent was placed in November as this requires only 2-4 weeks of Plavix.  I spoke with Dr. Claud Henderson in the PACU, and so long as he is taking p.o., we can resume 81 mg of aspirin tomorrow.  The patient is currently somewhat sedated from his anesthesia, but clearly is only complaining of abdominal pain.  He has had reasonable urine output.  His 10-point review of systems is otherwise negative.  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  The patient's medications included 1. Lasix 40 mg a day. 2. Potassium. 3. Lisinopril 2.5 mg a day. 4. Aspirin.  He has no known allergies.  He denies tobacco use.  He does not drink alcohol.  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  He was otherwise prior to this sedentary and did not have any antecedent history of chest pain before his MI in November.  FAMILY HISTORY:  Remarkable for breast cancer in his mother.  No history of colon cancer in the family.  No history of premature coronary artery disease.  GENERAL:  His exam is remarkable for pale male with abdominal pain. VITAL SIGNS:  Blood pressure is 123/70, pulse 82 and regular, respiratory rate 14, afebrile. HEENT:  Unremarkable. NECK:  Carotids are normal without bruit.  No lymphadenopathy, thyromegaly.  JVP is currently not elevated. LUNGS:  Poor inspiratory effort due to pain, but no obvious crackles or CHF. CARDIAC:  Heart sounds are distant, S1, S2.  No obvious murmurs.  Status post AP resection with distended abdomen and dressings in place.  EXTREMITIES:  Distal pulses are intact with trace edema. NEURO:  Nonfocal. SKIN:  Warm and dry. MUSCULOSKELETAL:  No muscular weakness.  He does not have any postop lab work yet.  As indicated, his preop potassium was 4.6 on April 5.  His creatinine was normal at 0.72 and his preop hemoglobin on April 5 was 11.  His electrocardiogram last recorded November 23 shows sinus rhythm with no acute changes.  IMPRESSION: 1. Coronary artery disease, bare metal stent.  Discussed this with Dr.     Claud Henderson.  He can start 81 mg of aspirin in the morning.     Given his coronary artery disease, I will start him on low-dose     Coreg 3.125 b.i.d. to try to limit his heart rate and risk of     postop ischemia. 2. Decreased ejection fraction, no overt heart failure.  I have KVO'ed     his fluids which were running too fast  postoperatively,     particularly in light of his 2 units of blood.  He will get 20 mg     of IV Lasix now.  His home dose is 40 mg p.o., but we will write     this on a daily basis with potassium supplementation until we know     if he can take p.o. given his extensive abdominal surgery.  We will     check a BNP and electrolytes in the morning.  Currently, he does     not appear volume overloaded.  We will also try to continue his low-     dose lisinopril so long as his systolic pressure blood pressure     stays above 100. 3. Anemia with blood loss during surgery.  Try to keep hemoglobin 10     or above.  This will be followed closely by the surgeons.  We are happy to follow the patient along in the postop phase.     Adam Pick. Eden Emms, MD, [facility]     PCN/MEDQ  D:  07/13/2010  T:  07/14/2010  Job:  [id number]  Electronically Signed by Charlton Haws MD [facility] on 07/15/2010 03:37:09 PM

## 2010-07-16 LAB — CBC
Hemoglobin: 9 g/dL — ABNORMAL LOW (ref 13.0–17.0)
MCH: 29.4 pg (ref 26.0–34.0)
MCV: 89.2 fL (ref 78.0–100.0)
Platelets: 284 10*3/uL (ref 150–400)
RBC: 3.06 MIL/uL — ABNORMAL LOW (ref 4.22–5.81)
WBC: 17 10*3/uL — ABNORMAL HIGH (ref 4.0–10.5)

## 2010-07-17 LAB — BASIC METABOLIC PANEL
BUN: 15 mg/dL (ref 6–23)
CO2: 28 mEq/L (ref 19–32)
Calcium: 8 mg/dL — ABNORMAL LOW (ref 8.4–10.5)
GFR calc non Af Amer: >60 mL/min (ref >60)
Glucose, Bld: 102 mg/dL — ABNORMAL HIGH (ref 70–99)
Potassium: 3.3 mEq/L — ABNORMAL LOW (ref 3.5–5.1)
Sodium: 136 mEq/L (ref 135–145)

## 2010-07-17 LAB — CBC
HCT: 27.2 % — ABNORMAL LOW (ref 39.0–52.0)
Hemoglobin: 9.1 g/dL — ABNORMAL LOW (ref 13.0–17.0)
MCHC: 33.5 g/dL (ref 30.0–36.0)
MCV: 89.2 fL (ref 78.0–100.0)
RDW: 14.2 % (ref 11.5–15.5)

## 2010-07-18 LAB — BASIC METABOLIC PANEL
BUN: 8 mg/dL (ref 6–23)
Chloride: 101 mEq/L (ref 96–112)
Creatinine, Ser: 0.66 mg/dL (ref 0.4–1.5)
Glucose, Bld: 103 mg/dL — ABNORMAL HIGH (ref 70–99)
Potassium: 3.4 mEq/L — ABNORMAL LOW (ref 3.5–5.1)

## 2010-07-30 NOTE — Op Note (Signed)
  NAMEAXIEL, FJELD                 ACCOUNT NO.:  [id number]  MEDICAL RECORD NO.:  [id number]           PATIENT TYPE:  I  LOCATION:  2307                         FACILITY:  [facility]  PHYSICIAN:  Danae Chen, M.D.  DATE OF BIRTH:  1955/04/23  DATE OF PROCEDURE:  07/13/2010 DATE OF DISCHARGE:                              OPERATIVE REPORT   PREOPERATIVE DIAGNOSIS:  Urethral injury.  POSTOPERATIVE DIAGNOSIS:  Urethral injury.  PROCEDURE DONE:  Repair of urethral injury.  SURGEONS:  Danae Chen, MD, Dr. [name], and Dr. Zachery Dakins.  The patient is a 55 year old male who has undergone an abdominoperineal resection for rectal cancer.  I saw the patient early in the morning for cystoscopy and insertion of ureteral stents.  Dr. [name] called me back because during a difficult abdominoperineal resection, there was an injury to the urinary tract.  There was a transverse laceration of the urethra at the membranous urethra that measures about 2 cm.  The  rectal tumor was still adherent to the prostate.  I identified the prostate gland for Dr. [name] and he was able to remove the tumor, although it was a difficult resection and he may still have some residual tumor attached to the prostate, but Dr. [name] was able to do the resection.  At this point, I then closed the iatrogenic urethral injury with #4-0 Vicryl.  The Foley catheter will then be left in place.  The ureteral catheters can be removed.  The patient was then left in dorsal lithotomy position for completion of the procedure by Dr. [name].     Danae Chen, M.D.     MN/MEDQ  D:  07/13/2010  T:  07/14/2010  Job:  [id number]  Electronically Signed by Lindaann Slough M.D. on 07/30/2010 10:57:57 AM

## 2010-07-30 NOTE — Op Note (Signed)
  NAMEDEBORAH, DONDERO                 ACCOUNT NO.:  [id number]  MEDICAL RECORD NO.:  [id number]           PATIENT TYPE:  I  LOCATION:  2307                         FACILITY:  [facility]  PHYSICIAN:  Danae Chen, M.D.  DATE OF BIRTH:  [birth year]  DATE OF PROCEDURE:  07/13/2010 DATE OF DISCHARGE:                              OPERATIVE REPORT   PREOPERATIVE DIAGNOSIS:  Rectal cancer.  POSTOPERATIVE DIAGNOSIS:  Rectal cancer.  PROCEDURE DONE:  Cystoscopy and insertion of bilateral ureteral catheters.  SURGEON:  Danae Chen, MD  ANESTHESIA:  General.  INDICATIONS:  The patient is a 55 years old male who has rectal cancer and he is scheduled for abdominoperineal resection by Dr. [name]. Dr. [name] has requested that I insert bilateral ureteral catheters before his procedure.  The patient was identified by his wristband and proper time-out was taken.  Under general anesthesia, he was prepped and draped and placed in the dorsal lithotomy position.  A panendoscope was inserted in the bladder. The anterior urethra is normal.  He has trilobar prostatic hypertrophy. The bladder mucosa is reddened.  There is marked edema at the trigone. There is no stone or tumor in the bladder.  It was difficult to visualize the ureteral orifices.  However, I was able to catheterize the left ureteral orifice with a Glidewire and I passed a #6-French ureteral catheter over the Glidewire up into the ureter, all the way up into the renal pelvis.  The same procedure was done on the right side. Then, a #16 Foley catheter was inserted in the bladder.  The Foley catheter and the ureteral catheters were attached to a Goldberg attachment.  The patient was then left in the dorsal lithotomy position for abdominoperineal resection by Dr. [name].     Danae Chen, M.D.     MN/MEDQ  D:  07/13/2010  T:  07/14/2010  Job:  [id number]  Electronically Signed by Lindaann Slough M.D. on 07/30/2010 10:56:40 AM

## 2010-08-05 LAB — POCT I-STAT 4, (NA,K, GLUC, HGB,HCT)
Glucose, Bld: 177 mg/dL — ABNORMAL HIGH (ref 70–99)
HCT: 30 % — ABNORMAL LOW (ref 39.0–52.0)
Hemoglobin: 10.2 g/dL — ABNORMAL LOW (ref 13.0–17.0)
Sodium: 138 mEq/L (ref 135–145)

## 2010-08-08 NOTE — Discharge Summary (Signed)
Adam Henderson, Adam Henderson                 ACCOUNT NO.:  [id number]  MEDICAL RECORD NO.:  [id number]           PATIENT TYPE:  I  LOCATION:  5150                         FACILITY:  [facility]  PHYSICIAN:  [name]. [name], M.D.DATE OF BIRTH:  1956-03-02  DATE OF ADMISSION:  07/13/2010 DATE OF DISCHARGE:  07/22/2010                              DISCHARGE SUMMARY   FINAL DIAGNOSES: 1. Adenocarcinoma of the rectum, pathologic stage ypT3, ypN2a. 2. Ischemic cardiomyopathy with 20-25% ejection fraction. 3. Coronary artery disease with history of myocardial infarction in     November 2011. 4. Status post Port-A-Cath insertion. 5. Obesity, resolved. 6. Hyperlipidemia.  OPERATIONS PERFORMED: 1. Rigid proctoscopy with disimpaction. 2. Abdominal and perineal resection of rectal cancer. 3. Insertion of bilateral ureteral stents. 4. Repair of urethral injury.  Date of surgery November 12, 2010.  HISTORY OF PRESENT ILLNESS:  This is a 55 year old Caucasian gentleman who has intentionally lost a lot of weight over the past 2 years. Historically, he has had changes of bowel habits for over a year, but denies seeing any blood.  He presented to [facility] in November 2011 with a hemoglobin of 3.2 and an acute myocardial infarction.  Following transfusion and cardiac catheterization and coronary artery stenting, he stabilized and then underwent colonoscopy which showed a large ulcerated mass extending from the very distal rectum up to about 10 cm proximally. Biopsy showed adenocarcinoma.  He was found to have a cardiomyopathy out of proportion to his coronary artery disease.  He went home and underwent neoadjuvant chemotherapy and neoadjuvant radiation therapy under the guidance of [name] as well as Dr. Antony Blackbird. This was performed in [place].  The patient was referred to me on June 16, 2010.  He had restaging MRI which showed a large tumor, not obviously invading surrounding structures,  radiographically I felt this was a T3, T2b tumor.  The patient was advised that he is not a candidate for sphincter sparing procedure due to the low level of the tumor.  He states that he took a bowel prep at home the day before surgery and tolerated it fairly well and was brought to the operating room electively.  HOSPITAL COURSE:  On the day of admission, the patient was taken to the operating room.  When we did a proctoscopy on him immediately, prior to the surgery, it was found that he had an extensive fecal impaction with extensive amount of solid and liquid stool up in the rectum.  We evacuated this up to about 22 cm.  Dr. Su Grand then placed bilateral ureteral stents and Foley catheter.  Abdominal and perineal resection was performed.  This was very difficult procedure because of intense fibrosis from the radiation therapy and obliteration of most of the tissue planes.  The tumor appeared to have invaded and was at least very densely adherent to the posterior wall of the prostate.  There were palpably enlarged lymph nodes in the mesorectum which was completely excised.  There was fecal impaction above the tumor as well which was resected and the proximal small bowel and colon felt normal.  There  were no liver masses.  Postoperatively, the patient was followed by [facility] as well as Dr. Su Grand and myself.  He got a little bit dehydrated, so we liberallized  his fluids on the second postop day.  He did well with that. He was seen by the wound and ostomy nurse who evaluated his colostomy stoma and helped him with education throughout the hospital course.  He was followed by [facility] throughout his hospital course as well.  When his final pathology report came back, we discussed this with him. We advised him that he would need to follow up with [name] and he will possibly need some adjuvant chemotherapy as well.  The nurses called on about the third  postop day stating that the colostomy bag had come off and some stool had gotten on the wound.  They wanted me to check this.  The wound really did not look red, but I removed some of the staples and packed the wound open.  The fascia was intact.  This was not really infected.  Over the next several days, the patient was seen by Physical Therapy and Occupational Therapy.  He resumed diet and good colostomy function.  The perineal wounds seemed to be healing okay and ultimately the Al Pimple drains were removed from the pelvis.  He was discharged on July 22, 2010, tolerating a diet.  He is afebrile with good vital signs.  Abdomen was soft.  His abdominal wound was clean.  The perineal wounds were clean.  The colostomy is healthy. There was no hernia.  Arrangements were made for home health nursing and home health physical therapy.  Followup was arranged with [name].  DISCHARGE MEDICATIONS: 1. Plavix 75 mg daily. 2. Prednisone 10 mg as needed. 3. Vicodin. 4. Lasix 20 mg daily. 5. Prochlorperazine 10 mg daily. 6. Vitamin B12 1000 mcg daily. 7. Tamsulosin 0.4 mg daily. 8. Potassium chloride 20 mEq daily. 9. Protonix 40 mg daily. 10.Aspirin 81 mg daily.  The patient was asked to perform daily dressing changes.  He was asked to see me in 1 week and Dr. Su Grand in 1 week.  He was sent home with the Foley in place and Dr. Brunilda Payor plans a cystourethrogram in the office.     [name]. [name], M.D.     HMI/MEDQ  D:  08/01/2010  T:  08/02/2010  Job:  [id number]  cc:   Lebron Conners. Darovsky, M.D. Billie Lade, Ph.D., M.D. Ernestina Penna, M.D. [name] [name], MD, [name]. [name], MD Lindaann Slough, M.D.  Electronically Signed by Claud Kelp M.D. on 08/08/2010 04:54:09 PM

## 2010-08-19 NOTE — Assessment & Plan Note (Signed)
[facility]                              CARDIOLOGY OFFICE NOTE   Adam Henderson, Adam Henderson                          MRN:          [id number]  DATE:10/10/2005                            DOB:          06-11-1955    REFERRING PHYSICIAN:  [name] CRENSHAW   Adam Henderson is a very pleasant 55 year old gentleman that I recently saw on  Aug 23, 2005 for an episode of chest pain and shortness of breath.  At that  time we noted that his symptoms were somewhat atypical but we did schedule  him for a stress Myoview.  This was performed on September 06, 2005.  Patient was  noted to have left ventricular enlargement and an ejection fraction of 33%.  There was inferior septal, septal, and apical akinesis.  The perfusion  revealed minor apical defect felt most likely due to physiologic thinning  with superimposed diaphragmatic attenuation but no other abnormalities were  noted.  There was no ischemia noted.  We therefore scheduled the patient to  have an echocardiogram and his LV function was thought to be in the 25-35%  range and there was mild left ventricular enlargement.  He now returns for  follow-up.  He denies any dyspnea on exertion, orthopnea, PND, pedal edema,  palpitations, presyncope, syncope, or chest pain.   MEDICATIONS:  1.  Metformin HCl 500 mg p.o. b.i.d.  2.  Aspirin 325 mg p.o. daily.   PHYSICAL EXAMINATION:  VITAL SIGNS:  Blood pressure 125/73 and his pulse is  87.  He weighs 279 pounds.  GENERAL:  He is well-developed and somewhat obese.  He is in no acute  distress.  CHEST:  Clear.  CARDIOVASCULAR:  Regular rate and rhythm.  EXTREMITIES:  No edema.   DIAGNOSES:  1.  New diagnosis of cardiomyopathy.  2.  Recently diagnosed diabetes mellitus.   PLAN:  Adam Henderson has been found to have a cardiomyopathy.  His ejection  fraction is in the 25-35% range.  Note, his perfusion appears to be  relatively normal other than apical thinning and this certainly may  be a non-  ischemic cardiomyopathy.  However, I recommended cardiac catheterization for  definitive evaluation.  He is somewhat hesitant and would like to think  about this before committing.  In the meantime, we will add an ACE inhibitor  (lisinopril 10 mg p.o. daily) as well as beta blockade (Coreg 3.125 mg p.o.  b.i.d.).  I will also add Zocor 40 mg p.o. q.h.s. given his newly diagnosed  diabetes mellitus.  We will plan to check laboratories in one week.  I will  check a CMET, CBC, TSH, and ferritin at that time.  He will need lipids and  liver in approximately six weeks.  I will see him back in four weeks for  medication titration and to again discuss cardiac catheterization.  We will  plan to repeat his echocardiogram approximately three months after his  medications have been fully titrated.  He may require ICD in the future if  his LV  function does not improve.  We  will also most likely refer him to the CHF  Clinic for medication titration after I see him in four weeks.                              [name] [name], MD, [facility]    BSC/MedQ  DD:  10/10/2005  DT:  10/10/2005  Job #:  [id number]   cc:   Ernestina Penna, MD

## 2010-10-24 ENCOUNTER — Encounter (INDEPENDENT_AMBULATORY_CARE_PROVIDER_SITE_OTHER): Payer: Self-pay | Admitting: General Surgery

## 2010-10-31 ENCOUNTER — Encounter (INDEPENDENT_AMBULATORY_CARE_PROVIDER_SITE_OTHER): Payer: Self-pay | Admitting: General Surgery

## 2010-10-31 ENCOUNTER — Ambulatory Visit (INDEPENDENT_AMBULATORY_CARE_PROVIDER_SITE_OTHER): Payer: BC Managed Care – PPO | Admitting: General Surgery

## 2010-10-31 VITALS — Wt 194.0 lb

## 2010-10-31 DIAGNOSIS — C20 Malignant neoplasm of rectum: Secondary | ICD-10-CM

## 2010-10-31 NOTE — Progress Notes (Signed)
Subjective:     Patient ID: Adam Henderson, male   DOB: May 06, 1955, 55 y.o.   MRN: [id number]  HPI Patient is status post neoadjuvant chemotherapy and neoadjuvant radiation therapy, subsequent abdominal and perineal resection with repair of urethral laceration; date of surgery was July 13, 2010.  He has basically recovered from the surgery. All of his wounds have healed. He has started back on chemotherapy. One he started chemotherapy he's had a little bit of bleeding the lower end of his midline incision but that has now healed. He has no problems with the colostomy. He has no pain in his incision or his perineum. He is tolerating a diet. The Port-A-Cath is functioning well.  Review of Systems     Objective:   Physical Exam Patient looks well. He is in no distress. One of his sons is here with him.  Abdomen is soft flat nontender. The midline incision is completely healed. There is no hernia. The colostomy is healthy. There is no stomal hernia.  Perineum is well-healed. No signs of infection drainage or protrusion.    Assessment:     Poorly differentiated adenocarcinoma of the rectum, locally advanced, status post neoadjuvant chemotherapy and subsequent abdominal and perineal resection.  Final pathology revealed metastatic cancer in 13/15 lymph nodes; but negative proximal and distal margins.  Clinically I suspect the radial margin was positive.  He has recovered from repair of urethral injury and is voiding uneventfully now. He states that Dr. Brunilda Payor has discharged him.    Plan:     Continue adjuvant chemotherapy under the guidance of [name].Marland Kitchen  Return to see me in April 2013, sooner if there are any problems.

## 2010-10-31 NOTE — Patient Instructions (Signed)
You seem to be doing well. All of your wounds have healed nicely. There is no sign of infection. The colostomy looks healthy. I agree with continuing the chemotherapy. I will see you back in April of 2013, sooner if necessary.

## 2011-04-26 ENCOUNTER — Encounter: Payer: Self-pay | Admitting: Internal Medicine

## 2011-07-31 ENCOUNTER — Ambulatory Visit (INDEPENDENT_AMBULATORY_CARE_PROVIDER_SITE_OTHER): Payer: BC Managed Care – PPO | Admitting: General Surgery

## 2011-07-31 ENCOUNTER — Encounter (INDEPENDENT_AMBULATORY_CARE_PROVIDER_SITE_OTHER): Payer: Self-pay | Admitting: General Surgery

## 2011-07-31 VITALS — BP 96/60 | HR 80 | Temp 98.5°F | Resp 14 | Ht 70.0 in | Wt 222.4 lb

## 2011-07-31 DIAGNOSIS — C20 Malignant neoplasm of rectum: Secondary | ICD-10-CM | POA: Insufficient documentation

## 2011-07-31 NOTE — Patient Instructions (Signed)
All of your surgical wounds appear to have healed well. There are no hernias. Her colostomy site is healthy. There is no evidence of enlarged lymph nodes or cancer that I can detect.  You will be referred back to [name] for a colonoscopy.  Be sure to keep all of her appointments with [name]. He will order lab work and x-rays in the future.  Return to see Dr. [name] in one year.

## 2011-07-31 NOTE — Progress Notes (Signed)
Patient ID: Adam Henderson, male   DOB: 05-08-1955, 56 y.o.   MRN: [id number]  Chief Complaint  Patient presents with  . Follow-up    Colon cancer     HPI Adam Henderson is a 56 y.o. male.  He returns for long-term followup regarding his rectal cancer.  The patient was initially diagnosed with cancer of the distal rectum, locally advanced early in 2012. He had neoadjuvant chemotherapy and neoadjuvant radiation therapy. On July 13, 2010 he underwent abdominal perineal resection, placement of bilateral ureteral stents, and closure of his urethra by Dr. Brunilda Payor. This was a stage III cancer with multiple positive lymph nodes.  He has recovered from surgery and healed all of his wounds. He finished his adjuvant chemotherapy on January 19, 2011. He is followed closely by [name] in [place]..  In December he had CT scans and laboratory and was told that all of these are negative.  One month ago he saw Dr. Brunilda Payor because of gross hematuria. He was scoped and nothing was found. This he was diagnosed with urinary tract infection. He is to followup with Dr. Brunilda Payor in July.  He is feeling good. Appetite excellent. Colostomy working well. Bag has a good seal. He has no problems with his wound and his perineum occasionally has a little bit of pain. He has not had a followup colonoscopy yet. HPI  Past Medical History  Diagnosis Date  . CAD (coronary artery disease)   . Hyperlipidemia   . Heart attack   . Blood in urine   . Cancer     colon    Past Surgical History  Procedure Date  . Proctoscopy   . Colon surgery     abdomeno perineal resection  . Ureteral repair   . Cardiac stents     Family History  Problem Relation Age of Onset  . Cancer Mother     breast  . Diabetes Father     Social History History  Substance Use Topics  . Smoking status: Never Smoker   . Smokeless tobacco: Never Used  . Alcohol Use: No    No Known Allergies  Current Outpatient Prescriptions  Medication Sig  Dispense Refill  . aspirin 81 MG tablet Take 81 mg by mouth daily.        . furosemide (LASIX) 20 MG tablet Take 20 mg by mouth 2 (two) times daily.        . IRON PO Take by mouth daily.        . pantoprazole (PROTONIX) 40 MG tablet Take 40 mg by mouth daily.        . potassium chloride SA (K-DUR,KLOR-CON) 20 MEQ tablet Take by mouth 2 (two) times daily.        . pravastatin (PRAVACHOL) 20 MG tablet Take 20 mg by mouth at bedtime.        . predniSONE (DELTASONE) 10 MG tablet Take 10 mg by mouth as needed.       . prochlorperazine (COMPAZINE) 10 MG tablet Take 10 mg by mouth every 6 (six) hours as needed.        . vitamin B-12 (CYANOCOBALAMIN) 1000 MCG tablet Take 1,000 mcg by mouth daily.          Review of Systems Review of Systems  Constitutional: Negative for fever, chills and unexpected weight change.  HENT: Negative for hearing loss, congestion, sore throat, trouble swallowing and voice change.   Eyes: Negative for visual disturbance.  Respiratory: Negative for cough  and wheezing.   Cardiovascular: Negative for chest pain, palpitations and leg swelling.  Gastrointestinal: Negative for nausea, vomiting, abdominal pain, diarrhea, constipation, blood in stool, abdominal distention, anal bleeding and rectal pain.  Genitourinary: Positive for hematuria. Negative for difficulty urinating.  Musculoskeletal: Negative for arthralgias.  Skin: Negative for rash and wound.  Neurological: Negative for seizures, syncope, weakness and headaches.  Hematological: Negative for adenopathy. Does not bruise/bleed easily.  Psychiatric/Behavioral: Negative for confusion.    Blood pressure 96/60, pulse 80, temperature 98.5 F (36.9 C), temperature source Temporal, resp. rate 14, height 5\' 10"  (1.778 m), weight 222 lb 6 oz (100.869 kg).  Physical Exam Physical Exam  Constitutional: He is oriented to person, place, and time. He appears well-developed and well-nourished. No distress.  HENT:  Head:  Normocephalic and atraumatic.  Pulmonary/Chest: Breath sounds normal. No respiratory distress. He has no wheezes. He has no rales. He exhibits no tenderness.  Abdominal: Soft. Bowel sounds are normal. He exhibits no distension and no mass. There is no tenderness. There is no rebound and no guarding.       Well-healed midline scar. Healthy colostomy. No hernias. No masses. No distention. Liver and spleen not enlarged.  Genitourinary:       Perineal wound well healed. No hernia. No nodules. No tenderness. No inguinal adenopathy.  Musculoskeletal: Normal range of motion. He exhibits no edema and no tenderness.  Neurological: He is alert and oriented to person, place, and time.  Skin: Skin is warm and dry. No rash noted. He is not diaphoretic. No erythema. No pallor.  Psychiatric: He has a normal mood and affect. His behavior is normal. Judgment and thought content normal.    Data Reviewed I have reviewed my old records, Dr. Conni Slipper his records.  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  Final pathology reveals 9 cm tumor, proximal and distal margins negative, 13/15 minutes positive, postop ypT3, ypN2b.  Uneventful recovery and no evidence of recurrence one year postop  Recent evaluation for hematuria, apparently nonneoplastic in nature  Coronary disease  History H. fibrillation, currently in sinus rhythm  Port-A-Cath in place.    Plan    Reevaluation for hematuria with Dr. Brunilda Payor in July  Followup [name]. in June. Most likely will have followup CT scanning and lab work at that time  Visual be referred for [facility] for colonoscopy at this time since he is one year postop  Return to see me in one year.       [name]. [name], M.D., [facility] General and Minimally invasive Surgery Breast and Colorectal  Surgery Office:   [phone] Pager:   [phone]  07/31/2011, 4:48 PM

## 2011-08-01 ENCOUNTER — Telehealth (INDEPENDENT_AMBULATORY_CARE_PROVIDER_SITE_OTHER): Payer: Self-pay

## 2011-08-01 ENCOUNTER — Telehealth: Payer: Self-pay | Admitting: *Deleted

## 2011-08-01 NOTE — Telephone Encounter (Signed)
Pt notified of date of colonoscopy 5-17 with Dr [name].

## 2011-08-01 NOTE — Telephone Encounter (Signed)
Adam Henderson, could you, please, call [name] to schedule a recall colonoscopy? Thanx. He had colon cancer in Nov 2011 and we sent him recall letter in Jan. 2013 bur he has not responded. ----- Message ----- From: Ernestene Mention, MD Sent: 07/31/2011 4:59 PM To: Lewayne Bunting, MD, Hart Carwin, MD, *      Per Appointments in [facility], patient is scheduled for pre visit on 08/04/11 and Colonoscopy on 08/18/11

## 2011-08-09 NOTE — Telephone Encounter (Signed)
Please call pt to tell him, that Dr [name] wants him to have colonoscopy since it has been 1 1/2 years since his colon cancer. May be he wants somebody else to do it but it ought to be done.

## 2011-08-09 NOTE — Telephone Encounter (Signed)
Left a message for patient to call me. 

## 2011-08-09 NOTE — Telephone Encounter (Signed)
Dr. [name], Just wanted to let you know this patient cancelled his appointment for pre visit and colonoscopy.

## 2011-08-15 NOTE — Telephone Encounter (Signed)
Left a message with family for patient to call me. 

## 2011-08-16 ENCOUNTER — Other Ambulatory Visit: Payer: Self-pay | Admitting: *Deleted

## 2011-08-16 DIAGNOSIS — C20 Malignant neoplasm of rectum: Secondary | ICD-10-CM

## 2011-08-16 NOTE — Telephone Encounter (Signed)
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 He states he cannot come until late June. Scheduled patient on 10/11/11 at 2:00PM for colonoscopy and pre visit on 10/04/11 at 4:30 PM.

## 2011-08-17 ENCOUNTER — Encounter: Payer: BC Managed Care – PPO | Admitting: Internal Medicine

## 2011-08-18 ENCOUNTER — Encounter: Payer: BC Managed Care – PPO | Admitting: Internal Medicine

## 2011-08-22 ENCOUNTER — Encounter: Payer: BC Managed Care – PPO | Admitting: Internal Medicine

## 2011-08-22 DIAGNOSIS — C20 Malignant neoplasm of rectum: Secondary | ICD-10-CM

## 2011-08-22 DIAGNOSIS — Z452 Encounter for adjustment and management of vascular access device: Secondary | ICD-10-CM

## 2011-09-28 ENCOUNTER — Encounter: Payer: BC Managed Care – PPO | Admitting: Hematology and Oncology

## 2011-09-28 DIAGNOSIS — Z452 Encounter for adjustment and management of vascular access device: Secondary | ICD-10-CM

## 2011-09-28 DIAGNOSIS — C20 Malignant neoplasm of rectum: Secondary | ICD-10-CM

## 2011-10-04 ENCOUNTER — Ambulatory Visit (AMBULATORY_SURGERY_CENTER): Payer: BC Managed Care – PPO

## 2011-10-04 VITALS — Ht 70.0 in | Wt 211.7 lb

## 2011-10-04 DIAGNOSIS — C20 Malignant neoplasm of rectum: Secondary | ICD-10-CM

## 2011-10-04 MED ORDER — MOVIPREP 100 G PO SOLR: 1.0000 | Freq: Once | ORAL | Status: DC

## 2011-10-09 ENCOUNTER — Encounter: Payer: Self-pay | Admitting: Internal Medicine

## 2011-10-10 ENCOUNTER — Encounter: Payer: BC Managed Care – PPO | Admitting: Internal Medicine

## 2011-10-10 DIAGNOSIS — R319 Hematuria, unspecified: Secondary | ICD-10-CM

## 2011-10-10 DIAGNOSIS — C20 Malignant neoplasm of rectum: Secondary | ICD-10-CM

## 2011-10-11 ENCOUNTER — Ambulatory Visit (AMBULATORY_SURGERY_CENTER): Payer: BC Managed Care – PPO | Admitting: Internal Medicine

## 2011-10-11 ENCOUNTER — Encounter: Payer: Self-pay | Admitting: Internal Medicine

## 2011-10-11 VITALS — BP 97/69 | HR 83 | Temp 97.5°F | Resp 20 | Ht 70.0 in | Wt 211.0 lb

## 2011-10-11 DIAGNOSIS — C20 Malignant neoplasm of rectum: Secondary | ICD-10-CM

## 2011-10-11 DIAGNOSIS — C801 Malignant (primary) neoplasm, unspecified: Secondary | ICD-10-CM

## 2011-10-11 DIAGNOSIS — Z85048 Personal history of other malignant neoplasm of rectum, rectosigmoid junction, and anus: Secondary | ICD-10-CM

## 2011-10-11 MED ORDER — SODIUM CHLORIDE 0.9 % IV SOLN: 500.0000 mL | INTRAVENOUS | Status: DC

## 2011-10-11 NOTE — Patient Instructions (Signed)
YOU HAD AN ENDOSCOPIC PROCEDURE TODAY AT THE [facility]: Refer to the procedure report that was given to you for any specific questions about what was found during the examination.  If the procedure report does not answer your questions, please call your gastroenterologist to clarify.  If you requested that your care partner not be given the details of your procedure findings, then the procedure report has been included in a sealed envelope for you to review at your convenience later.  YOU SHOULD EXPECT: Some feelings of bloating in the abdomen. Passage of more gas than usual.  Walking can help get rid of the air that was put into your GI tract during the procedure and reduce the bloating. If you had a lower endoscopy (such as a colonoscopy or flexible sigmoidoscopy) you may notice spotting of blood in your stool or on the toilet paper. If you underwent a bowel prep for your procedure, then you may not have a normal bowel movement for a few days.  DIET: Your first meal following the procedure should be a light meal and then it is ok to progress to your normal diet.  A half-sandwich or bowl of soup is an example of a good first meal.  Heavy or fried foods are harder to digest and may make you feel nauseous or bloated.  Likewise meals heavy in dairy and vegetables can cause extra gas to form and this can also increase the bloating.  Drink plenty of fluids but you should avoid alcoholic beverages for 24 hours.  ACTIVITY: Your care partner should take you home directly after the procedure.  You should plan to take it easy, moving slowly for the rest of the day.  You can resume normal activity the day after the procedure however you should NOT DRIVE or use heavy machinery for 24 hours (because of the sedation medicines used during the test).    SYMPTOMS TO REPORT IMMEDIATELY: A gastroenterologist can be reached at any hour.  During normal business hours, 8:30 AM to 5:00 PM Monday through Friday,  call [phone].  After hours and on weekends, please call the GI answering service at [phone] who will take a message and have the physician on call contact you.   Following lower endoscopy (colonoscopy or flexible sigmoidoscopy):  Excessive amounts of blood in the stool  Significant tenderness or worsening of abdominal pains  Swelling of the abdomen that is new, acute  Fever of 100F or higher    FOLLOW UP: If any biopsies were taken you will be contacted by phone or by letter within the next 1-3 weeks.  Call your gastroenterologist if you have not heard about the biopsies in 3 weeks.  Our staff will call the home number listed on your records the next business day following your procedure to check on you and address any questions or concerns that you may have at that time regarding the information given to you following your procedure. This is a courtesy call and so if there is no answer at the home number and we have not heard from you through the emergency physician on call, we will assume that you have returned to your regular daily activities without incident.  SIGNATURES/CONFIDENTIALITY: You and/or your care partner have signed paperwork which will be entered into your electronic medical record.  These signatures attest to the fact that that the information above on your After Visit Summary has been reviewed and is understood.  Full responsibility of the confidentiality   of this discharge information lies with you and/or your care-partner.    Information on high fiber diet given to you today as recommended by Dr. [name].

## 2011-10-11 NOTE — Op Note (Addendum)
[facility] [address] [place], [place]  [address]  COLONOSCOPY PROCEDURE REPORT  PATIENT:  Adam Henderson, Adam Henderson  MR#:  [id number] BIRTHDATE:  Nov 02, 1955, 55 yrs. old  GENDER:  male ENDOSCOPIST:  [name]. [name], MD REF. BY:  [name], M.D.,Dr Claud Kelp, Dr Roselind Messier PROCEDURE DATE:  10/11/2011 PROCEDURE:  Colon through ostomy ASA CLASS:  Class II INDICATIONS:  history of colon cancer rectal adenocarcinoma 02/2010, resected, s/p radiation and Chemo Rx, the has a diverting colostomy, this is the first colon since the surgery MEDICATIONS:   MAC sedation, administered by CRNA, propofol (Diprivan) 200 mg  DESCRIPTION OF PROCEDURE:   After the risks and benefits and of the procedure were explained, informed consent was obtained.  No rectal exam performed. The LB CF-[id number] endoscope was introduced through the anus and advanced to the cecum, which was identified by both the appendix and ileocecal valve.  The quality of the prep was good, using MoviPrep.  The instrument was then slowly withdrawn as the colon was fully examined. <<PROCEDUREIMAGES>>  FINDINGS:  A postoperative change was noted (see image1, image2, image3, image4, image5, and image6). normal appearing colostomy LUQ,   Retroflexed views in the rectum revealed not done.    The scope was then withdrawn from the patient and the procedure completed.  COMPLICATIONS:  None ENDOSCOPIC IMPRESSION: 1) Postop change functioning diverting colostomy LUQ, no evidence of recurrent cancer RECOMMENDATIONS: 1) High fiber diet.  REPEAT EXAM:  In 3 year(s) for.  ______________________________ [name]. [name], MD  CC:  n. REVISED:  10/11/2011 02:21 PM eSIGNED:   [name]. Subrina Vecchiarelli at 10/11/2011 02:21 PM  Shearon Balo, [phone]

## 2011-10-11 NOTE — Progress Notes (Signed)
Patient did not have preoperative order for IV antibiotic SSI prophylaxis. (G8918)  Patient did not experience any of the following events: a burn prior to discharge; a fall within the facility; wrong site/side/patient/procedure/implant event; or a hospital transfer or hospital admission upon discharge from the facility. (G8907)  

## 2011-10-12 ENCOUNTER — Telehealth: Payer: Self-pay | Admitting: *Deleted

## 2011-10-12 NOTE — Telephone Encounter (Signed)
  Follow up Call-  Call back number 10/11/2011  Post procedure Call Back phone  # [phone]  Permission to leave phone message Yes     Patient questions:  Message left to call us if necessary.

## 2011-10-25 ENCOUNTER — Encounter: Payer: BC Managed Care – PPO | Admitting: Internal Medicine

## 2011-10-30 ENCOUNTER — Telehealth (INDEPENDENT_AMBULATORY_CARE_PROVIDER_SITE_OTHER): Payer: Self-pay | Admitting: General Surgery

## 2011-10-30 NOTE — Telephone Encounter (Signed)
Pt called to ask for opinion from Dr. [name].  He is seen and scanned in [facility] at [facility].  Dr. Keitha Butte (surgeon) wants to do a procedure [patient does not know what] and pt uncomfortable with another surgeon unless okay with Dr. [name].  Suggested pt should ask Dr. Keitha Butte to call Dr. [name] so they can confer directly about the procedure he needs.  Pt understands.

## 2011-11-01 ENCOUNTER — Inpatient Hospital Stay (HOSPITAL_COMMUNITY)
Admission: EM | Admit: 2011-11-01 | Discharge: 2011-11-06 | DRG: 124 | Disposition: A | Discharge status: Home / Self Care | Payer: BC Managed Care – PPO | Attending: Cardiology | Admitting: Cardiology

## 2011-11-01 ENCOUNTER — Encounter (HOSPITAL_COMMUNITY): Payer: Self-pay | Admitting: Emergency Medicine

## 2011-11-01 ENCOUNTER — Emergency Department (HOSPITAL_COMMUNITY): Payer: BC Managed Care – PPO

## 2011-11-01 DIAGNOSIS — R188 Other ascites: Secondary | ICD-10-CM | POA: Clinically undetermined

## 2011-11-01 DIAGNOSIS — I252 Old myocardial infarction: Secondary | ICD-10-CM

## 2011-11-01 DIAGNOSIS — Z7982 Long term (current) use of aspirin: Secondary | ICD-10-CM

## 2011-11-01 DIAGNOSIS — I2589 Other forms of chronic ischemic heart disease: Secondary | ICD-10-CM | POA: Diagnosis present

## 2011-11-01 DIAGNOSIS — R079 Chest pain, unspecified: Secondary | ICD-10-CM

## 2011-11-01 DIAGNOSIS — I428 Other cardiomyopathies: Secondary | ICD-10-CM | POA: Diagnosis present

## 2011-11-01 DIAGNOSIS — Z85038 Personal history of other malignant neoplasm of large intestine: Secondary | ICD-10-CM

## 2011-11-01 DIAGNOSIS — I2510 Atherosclerotic heart disease of native coronary artery without angina pectoris: Secondary | ICD-10-CM

## 2011-11-01 DIAGNOSIS — C779 Secondary and unspecified malignant neoplasm of lymph node, unspecified: Secondary | ICD-10-CM | POA: Diagnosis present

## 2011-11-01 DIAGNOSIS — I959 Hypotension, unspecified: Secondary | ICD-10-CM | POA: Diagnosis present

## 2011-11-01 DIAGNOSIS — Z9861 Coronary angioplasty status: Secondary | ICD-10-CM

## 2011-11-01 DIAGNOSIS — I5023 Acute on chronic systolic (congestive) heart failure: Principal | ICD-10-CM

## 2011-11-01 DIAGNOSIS — R739 Hyperglycemia, unspecified: Secondary | ICD-10-CM

## 2011-11-01 DIAGNOSIS — C189 Malignant neoplasm of colon, unspecified: Secondary | ICD-10-CM

## 2011-11-01 DIAGNOSIS — I5022 Chronic systolic (congestive) heart failure: Secondary | ICD-10-CM

## 2011-11-01 DIAGNOSIS — I509 Heart failure, unspecified: Secondary | ICD-10-CM

## 2011-11-01 DIAGNOSIS — N390 Urinary tract infection, site not specified: Secondary | ICD-10-CM

## 2011-11-01 DIAGNOSIS — Z79899 Other long term (current) drug therapy: Secondary | ICD-10-CM

## 2011-11-01 DIAGNOSIS — R911 Solitary pulmonary nodule: Secondary | ICD-10-CM | POA: Diagnosis present

## 2011-11-01 DIAGNOSIS — I255 Ischemic cardiomyopathy: Secondary | ICD-10-CM

## 2011-11-01 DIAGNOSIS — D649 Anemia, unspecified: Secondary | ICD-10-CM | POA: Diagnosis present

## 2011-11-01 HISTORY — DX: Chronic systolic (congestive) heart failure: I50.22

## 2011-11-01 HISTORY — DX: Ischemic cardiomyopathy: I25.5

## 2011-11-01 HISTORY — DX: Solitary pulmonary nodule: R91.1

## 2011-11-01 LAB — COMPREHENSIVE METABOLIC PANEL
ALT: 13 U/L (ref 0–53)
AST: 27 U/L (ref 0–37)
Albumin: 3 g/dL — ABNORMAL LOW (ref 3.5–5.2)
Alkaline Phosphatase: 85 U/L (ref 39–117)
Chloride: 103 mEq/L (ref 96–112)
Potassium: 3.9 mEq/L (ref 3.5–5.1)
Sodium: 138 mEq/L (ref 135–145)
Total Bilirubin: 0.5 mg/dL (ref 0.3–1.2)

## 2011-11-01 LAB — URINE MICROSCOPIC-ADD ON

## 2011-11-01 LAB — POCT I-STAT TROPONIN I: Troponin i, poc: 0.01 ng/mL (ref 0.00–0.08)

## 2011-11-01 LAB — URINALYSIS, ROUTINE W REFLEX MICROSCOPIC
Specific Gravity, Urine: 1.02 (ref 1.005–1.030)
Urobilinogen, UA: 0.2 mg/dL (ref 0.0–1.0)

## 2011-11-01 LAB — CBC
Platelets: 199 10*3/uL (ref 150–400)
RDW: 14.1 % (ref 11.5–15.5)
WBC: 10.1 10*3/uL (ref 4.0–10.5)

## 2011-11-01 LAB — CARDIAC PANEL(CRET KIN+CKTOT+MB+TROPI)
Relative Index: INVALID (ref 0.0–2.5)
Troponin I: <0.3 ng/mL (ref <0.30)

## 2011-11-01 LAB — PROTIME-INR: INR: 1.38 (ref 0.00–1.49)

## 2011-11-01 LAB — PRO B NATRIURETIC PEPTIDE: Pro B Natriuretic peptide (BNP): 1658 pg/mL — ABNORMAL HIGH (ref 0–125)

## 2011-11-01 MED ORDER — ONDANSETRON HCL 4 MG/2ML IJ SOLN: 4.0000 mg | Freq: Four times a day (QID) | INTRAMUSCULAR | Status: DC | PRN

## 2011-11-01 MED ORDER — VITAMIN C 500 MG PO TABS
500.0000 mg | ORAL_TABLET | Freq: Every day | ORAL | Status: DC
  Administered 2011-11-01 – 2011-11-06 (×6): 500 mg via ORAL
  Filled 2011-11-01 (×5): qty 1

## 2011-11-01 MED ORDER — ASPIRIN 81 MG PO TABS: 81.0000 mg | ORAL_TABLET | Freq: Every day | ORAL | Status: DC

## 2011-11-01 MED ORDER — NITROGLYCERIN IN D5W 200-5 MCG/ML-% IV SOLN: 5.0000 ug/min | Freq: Once | INTRAVENOUS | Status: DC

## 2011-11-01 MED ORDER — SODIUM CHLORIDE 0.9 % IV SOLN: 250.0000 mL | INTRAVENOUS | Status: DC | PRN

## 2011-11-01 MED ORDER — PANTOPRAZOLE SODIUM 40 MG PO TBEC
40.0000 mg | DELAYED_RELEASE_TABLET | Freq: Every day | ORAL | Status: DC
  Administered 2011-11-01 – 2011-11-06 (×6): 40 mg via ORAL
  Filled 2011-11-01 (×4): qty 1

## 2011-11-01 MED ORDER — PROCHLORPERAZINE MALEATE 10 MG PO TABS
10.0000 mg | ORAL_TABLET | Freq: Four times a day (QID) | ORAL | Status: DC | PRN
  Filled 2011-11-01: qty 1

## 2011-11-01 MED ORDER — FERROUS SULFATE 325 (65 FE) MG PO TABS
325.0000 mg | ORAL_TABLET | Freq: Once | ORAL | Status: AC
  Filled 2011-11-01: qty 1

## 2011-11-01 MED ORDER — VITAMIN B-12 1000 MCG PO TABS
1000.0000 ug | ORAL_TABLET | Freq: Every day | ORAL | Status: DC
  Administered 2011-11-01 – 2011-11-06 (×6): 1000 ug via ORAL
  Filled 2011-11-01 (×5): qty 1

## 2011-11-01 MED ORDER — HEPARIN (PORCINE) IN NACL 100-0.45 UNIT/ML-% IJ SOLN
1600.0000 [IU]/h | INTRAMUSCULAR | Status: DC
  Administered 2011-11-01: 1250 [IU]/h via INTRAVENOUS
  Administered 2011-11-02: 1600 [IU]/h via INTRAVENOUS
  Filled 2011-11-01 (×3): qty 250

## 2011-11-01 MED ORDER — SODIUM CHLORIDE 0.9 % IJ SOLN: 3.0000 mL | INTRAMUSCULAR | Status: DC | PRN

## 2011-11-01 MED ORDER — HEPARIN BOLUS VIA INFUSION
4000.0000 [IU] | Freq: Once | INTRAVENOUS | Status: AC
  Administered 2011-11-01: 4000 [IU] via INTRAVENOUS

## 2011-11-01 MED ORDER — ZOLPIDEM TARTRATE 5 MG PO TABS: 5.0000 mg | ORAL_TABLET | Freq: Every evening | ORAL | Status: DC | PRN

## 2011-11-01 MED ORDER — NITROGLYCERIN 0.4 MG SL SUBL
0.4000 mg | SUBLINGUAL_TABLET | SUBLINGUAL | Status: DC | PRN
  Administered 2011-11-05: 0.4 mg via SUBLINGUAL
  Filled 2011-11-01: qty 25

## 2011-11-01 MED ORDER — SODIUM CHLORIDE 0.9 % IV SOLN
1000.0000 mL | INTRAVENOUS | Status: DC
  Administered 2011-11-01: 1000 mL via INTRAVENOUS
  Administered 2011-11-01: 500 mL via INTRAVENOUS

## 2011-11-01 MED ORDER — SODIUM CHLORIDE 0.9 % IJ SOLN
3.0000 mL | Freq: Two times a day (BID) | INTRAMUSCULAR | Status: DC
  Administered 2011-11-02 – 2011-11-05 (×6): 3 mL via INTRAVENOUS

## 2011-11-01 MED ORDER — ALPRAZOLAM 0.25 MG PO TABS
0.2500 mg | ORAL_TABLET | Freq: Two times a day (BID) | ORAL | Status: DC | PRN
  Administered 2011-11-05 (×2): 0.25 mg via ORAL
  Filled 2011-11-01 (×2): qty 1

## 2011-11-01 MED ORDER — SIMVASTATIN 10 MG PO TABS
10.0000 mg | ORAL_TABLET | Freq: Every day | ORAL | Status: DC
  Filled 2011-11-01: qty 1

## 2011-11-01 MED ORDER — POTASSIUM CHLORIDE CRYS ER 20 MEQ PO TBCR
20.0000 meq | EXTENDED_RELEASE_TABLET | Freq: Two times a day (BID) | ORAL | Status: DC
  Administered 2011-11-01 – 2011-11-05 (×8): 20 meq via ORAL
  Filled 2011-11-01 (×11): qty 1

## 2011-11-01 MED ORDER — DEXTROSE 5 % IV SOLN
1.0000 g | Freq: Once | INTRAVENOUS | Status: AC
  Administered 2011-11-01: 1 g via INTRAVENOUS
  Filled 2011-11-01: qty 10

## 2011-11-01 MED ORDER — FUROSEMIDE 20 MG PO TABS
20.0000 mg | ORAL_TABLET | Freq: Every day | ORAL | Status: DC
  Administered 2011-11-01 – 2011-11-02 (×2): 20 mg via ORAL
  Filled 2011-11-01 (×3): qty 1

## 2011-11-01 MED ORDER — ASPIRIN EC 81 MG PO TBEC
81.0000 mg | DELAYED_RELEASE_TABLET | Freq: Every day | ORAL | Status: DC
  Administered 2011-11-02 – 2011-11-05 (×4): 81 mg via ORAL
  Filled 2011-11-01 (×5): qty 1

## 2011-11-01 MED ORDER — TAMSULOSIN HCL 0.4 MG PO CAPS
0.4000 mg | ORAL_CAPSULE | Freq: Every day | ORAL | Status: DC
  Administered 2011-11-02 – 2011-11-06 (×5): 0.4 mg via ORAL
  Filled 2011-11-01 (×6): qty 1

## 2011-11-01 MED ORDER — ACETAMINOPHEN 325 MG PO TABS
650.0000 mg | ORAL_TABLET | ORAL | Status: DC | PRN
  Administered 2011-11-01 – 2011-11-06 (×6): 650 mg via ORAL
  Filled 2011-11-01 (×6): qty 2

## 2011-11-01 MED ORDER — SODIUM CHLORIDE 0.9 % IV SOLN
Freq: Once | INTRAVENOUS | Status: AC
  Administered 2011-11-01: 12:00:00 via INTRAVENOUS

## 2011-11-01 MED ORDER — SIMVASTATIN 10 MG PO TABS
10.0000 mg | ORAL_TABLET | Freq: Every day | ORAL | Status: DC
  Administered 2011-11-01 – 2011-11-06 (×6): 10 mg via ORAL
  Filled 2011-11-01 (×5): qty 1

## 2011-11-01 MED ORDER — IRON 325 (65 FE) MG PO TABS
325.0000 mg | ORAL_TABLET | Freq: Every day | ORAL | Status: DC
  Administered 2011-11-01: 325 mg via ORAL

## 2011-11-01 MED ORDER — VITAMIN B-12 1000 MCG PO TABS
1000.0000 ug | ORAL_TABLET | Freq: Once | ORAL | Status: AC
  Filled 2011-11-01: qty 1

## 2011-11-01 MED ORDER — ASPIRIN 81 MG PO CHEW: 81.0000 mg | CHEWABLE_TABLET | Freq: Once | ORAL | Status: DC

## 2011-11-01 MED ORDER — FERROUS SULFATE 325 (65 FE) MG PO TABS
325.0000 mg | ORAL_TABLET | Freq: Every day | ORAL | Status: DC
  Administered 2011-11-02 – 2011-11-06 (×5): 325 mg via ORAL
  Filled 2011-11-01 (×5): qty 1

## 2011-11-01 MED ORDER — ASPIRIN 81 MG PO CHEW
324.0000 mg | CHEWABLE_TABLET | Freq: Once | ORAL | Status: AC
  Administered 2011-11-01: 324 mg via ORAL
  Filled 2011-11-01: qty 4

## 2011-11-01 MED ORDER — PROCHLORPERAZINE MALEATE 10 MG PO TABS
10.0000 mg | ORAL_TABLET | Freq: Once | ORAL | Status: AC
  Administered 2011-11-01: 10 mg via ORAL
  Filled 2011-11-01: qty 1

## 2011-11-01 MED ORDER — VITAMIN C 500 MG PO TABS
500.0000 mg | ORAL_TABLET | Freq: Once | ORAL | Status: AC
  Filled 2011-11-01: qty 1

## 2011-11-01 NOTE — ED Notes (Signed)
[facility] HERE TO EVALUATE PATIENT.

## 2011-11-01 NOTE — Consult Note (Cosign Needed Addendum)
CARDIOLOGY CONSULT NOTE   Patient ID: Adam Henderson MRN: [id number] DOB/AGE: 1955-09-22 56 y.o.  Admit date: 11/01/2011  Primary Physician   [name], MD Primary Cardiologist [name] Reason for Consultation  Chest pain and SOB  Adam:WGNFA Lacretia Nicks Henderson is a 56 y.o. male with h/o CAD s/p stenting of second OM in 2011, ICM, colon cancer s/p abdominal perineal resection and bilateral stents in ureter in april 2012. Per Dr. Neita Garnet notes in [phone] his final pathology report showed metastasis in 13/15 lymph nodes. He presents today with midsternal chest pain which woke him up from sleep this morning at 3am.He describes it as constant at 9/10 radiates to back,neck and head. It is worsened with activity and feels like "someone standing on my chest". It was associated with SOB,palpitations,dizziness and right leg pain.He took one mucinex pill and went back to sleep but pain was not relieved . 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 He got a 2nd NTG SL by EMS, pain went down to 7/10 and was transferred to [facility] for further eval.He has had 4 ASA . Currently, he rates his chest pain at 7/10 with deep inspiration. Of note , he states at [facility], they found fluid in his pelvis and is scheduled for removal tomorrow but has to call to postpone since he is now hospitalized.    Past Medical History  Diagnosis Date  . CAD (coronary artery disease)     CATH 02/15/10-thrombectomy and stent in second obtuse marginal   . Hyperlipidemia   . Heart attack   . Blood in urine   . Cancer     colon- colonoscopy 7/10 no evidence of recurrent     Past Surgical History  Procedure Date  . Proctoscopy   . Ureteral repair   . Cardiac stents   . Colon surgery     abdomeno perineal resection  . Colostomy   . Portacath  placement     No Known Allergies  I have reviewed the patient's current medications    . sodium chloride   Intravenous Once  . aspirin  324 mg Oral Once  . cefTRIAXone (ROCEPHIN)  IV  1 g Intravenous Once  . nitroGLYCERIN  5 mcg/min Intravenous Once      . sodium chloride 500 mL (11/01/11 1321)     Prior to Admission medications   Medication Sig Start Date End Date Taking? Authorizing Provider  aspirin 81 MG tablet Take 81 mg by mouth daily.      Historical Provider, MD  furosemide (LASIX) 20 MG tablet Take 20 mg by mouth 2 (two) times daily.      Historical Provider, MD  IRON PO Take by mouth daily.      Historical Provider, MD  MOVIPREP 100 G SOLR Take 1 kit (100 g total) by mouth once. 10/04/11   Hart Carwin, MD  pantoprazole (PROTONIX) 40 MG tablet Take 40 mg by mouth daily.      Historical Provider, MD  potassium chloride SA (K-DUR,KLOR-CON) 20 MEQ tablet Take by mouth 2 (two) times daily.      Historical Provider, MD  pravastatin (PRAVACHOL) 20 MG tablet Take 20 mg by mouth at bedtime.      Historical Provider, MD  predniSONE (DELTASONE) 10 MG tablet Take 10 mg by  mouth as needed.     Historical Provider, MD  prochlorperazine (COMPAZINE) 10 MG tablet Take 10 mg by mouth every 6 (six) hours as needed.      Historical Provider, MD  Tamsulosin HCl (FLOMAX) 0.4 MG CAPS  08/11/11   Historical Provider, MD  vitamin B-12 (CYANOCOBALAMIN) 1000 MCG tablet Take 1,000 mcg by mouth daily.      Historical Provider, MD     History   Social History  . Marital Status: Single    Spouse Name: N/A    Number of Children: N/A  . Years of Education: N/A   Occupational History  . Not on file.   Social History Main Topics  . Smoking status: Never Smoker   . Smokeless tobacco: Never Used  . Alcohol Use: No  . Drug Use: No  . Sexually Active: Not on file   Other Topics Concern  . Not on file   Social History Narrative  . No narrative on file     Family History  Problem Relation  Age of Onset  . Cancer Mother     breast  . Diabetes Father   . Colon cancer Neg Hx      ROS:  +SOB,weakness,dizziness, palpitations,chest pain. Physical Exam: Blood pressure 99/60, pulse 98, temperature 99.7 F (37.6 C), temperature source Oral, resp. rate 32, height 5\' 9"  (1.753 m), weight 209 lb (94.802 kg), SpO2 98.00%.  General: Well developed, well nourished, male in no acute distress Head: Eyes PERRLA, No xanthomas.   Normocephalic and atraumatic, oropharynx without edema or exudate. Some             teeth missing. Chest: (R) porta-cath Lungs: bilateral breath sounds clear. Heart: HRRR S1 S2, no rub/gallop,  pulses are 2+ extrem.   Neck: No carotid bruits. No lymphadenopathy.  No JVD. Abdomen: LLQ colostomy bag. Bowel sounds present, abdomen soft and non-tender without hernias noted. Msk:  + Weakness. No spine or cva tenderness. , No joint deformities or effusions. Extremities: Trace edema. No clubbing or cyanosis.  Neuro: Alert and oriented X 3. No focal deficits noted. Psych:  Good affect, responds appropriately Skin: No rashes or lesions noted.  Labs:   Lab Results  Component Value Date   WBC 10.1 11/01/2011   HGB 10.6* 11/01/2011   HCT 32.4* 11/01/2011   MCV 85.7 11/01/2011   PLT 199 11/01/2011    Basename 11/01/11 1112  INR 1.38    Lab 11/01/11 1112  NA 138  K 3.9  CL 103  CO2 27  BUN 10  CREATININE 0.72  CALCIUM 8.5  PROT 7.7  BILITOT 0.5  ALKPHOS 85  ALT 13  AST 27  GLUCOSE 124*    Basename 11/01/11 1128  TROPIPOC 0.01    Echo: 10/03/2005 SUMMARY - The left ventricle was mildly dilated. Overall left ventricular systolic function was markedly decreased. Left ventricular ejection fraction was estimated , range being 25 % to 35 %. This study was inadequate for the evaluation of left ventricular regional wall motion. IMPRESSIONS - If clinically indicated, transesophageal echocardiography could provide additional information.   ECG:   01-Nov-2011 10:15:28  SINUS RHYTHM ~ normal P axis, V-rate 50- 99 BORDERLINE INTRAVENTRICULAR CONDUCTION DELAY ~ QRSd >164mS BORDERLINE T ABNORMALITIES, DIFFUSE LEADS ~ T flat/neg Standard 12 Lead Report ~ Unconfirmed Interpretation Borderline ECG 71mm/s 6mm/mV 150Hz  8.0.1 12SL 235 CID: [id number] Referred by: Unconfirmed Vent. rate 97 BPM PR interval 164 ms QRS duration 108 ms QT/QTc 340/432 ms P-R-T axes 63 50 -  81  Radiology:  Dg Chest Portable 1 View 11/01/2011  *RADIOLOGY REPORT*  Clinical Data: Chest pain, cough, weakness  PORTABLE CHEST - 1 VIEW  Comparison: Most recent chest x-ray 07/13/2010, CT scan of the abdomen and pelvis including the lung bases 10/09/2011  Findings: Right IJ approach portacatheter remains in unchanged position.  The catheter tip projects over the superior cavoatrial junction.  Progression of enlargement of the cardiac silhouette compared to 07/13/2010.  There is pulmonary vascular congestion, and indistinct pulmonary markings consistent with interstitial pulmonary edema. No definite pleural effusion.  No acute osseous finding.  IMPRESSION:  1. Cardiomegaly, pulmonary vascular congestion and mild interstitial edema consistent with CHF.  Of note, the degree of cardiomegaly has progressed compared to 07/13/2010.  2.  Right IJ approach portacatheter remains in unchanged and satisfactory position.  Original Report Authenticated By: HEATH    ASSESSMENT AND PLAN:     1. [place]/CAD - Pt presents with midsternal chest pain which he currently describes as sharp/pressure at  7/10 and worse with deep inspiration. He has received 4 baby ASA. His first trop is normal. ECG non-acute. Will admit to telemetry,cycle CE,check lipids,CBCs,ECGs,BMET daily.  2. Colon cancer- he is s/p chemotherapy/radiation and follows up with Dr. [name]. Per his notes in April 2012, pt's pathology report showed metastatic cancer in 13/15 lymph nodes. He has had chemotherapy and there was no evidence of  recurrence at 1 year. I am unsure whether his cancer has metastasized further and contributing to his chest pain with radiation to neck,back and head. Will order CT/MRI. 3. CHF exacerbation-  EF 25-35% per echo (10/2005). CXR shows mild interstitial edema. He  has mild edema legs and mild SOB with activity. MD advise on Lasix 40 IV and follow weights, I/Os, then resume home dose Lasix when his volume status improves. 4. Hypotension- improving after fluid bolus. 5. ?UTI- urine positive for blood/leucocytes and few bacteria. He currently has low grade fever. Antibiotics per ER MD. Consider culture.       6    Fever- this is low-grade.- This could be due to his UTI above and ? fluid which he states is in his pelvis,which                                                           is due for removal tomorrow at [facility] Tylenol PRN        7. Anemia- this is chronic and he is at his baseline. Initiate home meds   Signed: Theodore Demark 11/01/2011, 1:43 PM Co-Sign MD Patient seen and examined and history reviewed. Agree with above findings and plan. Patient presents with refractory chest pain. He has atypical pain that is worse with deep inspiration. Some relief but incomplete with Ntg. Known history of CAD. History of metastatic colon CA. Recent fluid collection in pelvis scheduled for drainage procedure tomorrow. Exam reveals some chest wall pain to palpation. Lungs are fairly clear after 1.5 liters of IV fluid. CXR shows cardiomegaly. I am not impressed that he has much edema. Ecg does show new ST-T depression in the inferolateral leads. Will cycle cardiac enzymes and Ecgs. Check BNP and d-dimer. Echo. If d-dimer is significantly elevated consider CT of chest to rule out PE. May need further ischemic workup depending on results of above studies  tonight.  Theron Arista [facility] 11/01/2011 3:31 PM

## 2011-11-01 NOTE — ED Notes (Signed)
Pt sent from [facility] for further eval of Substernal chest pain onset 0330. Pt reports woke up at 0330 with chest pressure. Skin cool, pale. Pt given 1 Nitro at Dr office and 1 Nitro by EMS.

## 2011-11-01 NOTE — Consult Note (Signed)
ANTICOAGULATION CONSULT NOTE - Initial Consult  Pharmacy Consult for Heparin Indication: chest pain/ACS  No Known Allergies  Patient Measurements: Height: 5\' 9"  (175.3 cm) Weight: 209 lb (94.802 kg) IBW/kg (Calculated) : 70.7  Heparin Dosing Weight: 89kg  Vital Signs: Temp: 99.7 F (37.6 C) (07/31 1322) Temp src: Oral (07/31 1322) BP: 100/61 mmHg (07/31 1500) Pulse Rate: 95  (07/31 1500)  Labs:  Basename 11/01/11 1112  HGB 10.6*  HCT 32.4*  PLT 199  APTT 32  LABPROT 17.2*  INR 1.38  HEPARINUNFRC --  CREATININE 0.72  CKTOTAL --  CKMB --  TROPONINI --    Estimated Creatinine Clearance: 119.9 ml/min (by C-G formula based on Cr of 0.72).   Medical History: Past Medical History  Diagnosis Date  . CAD (coronary artery disease)     CATH 02/15/10-thrombectomy and stent in second obtuse marginal   . Hyperlipidemia   . Heart attack   . Blood in urine   . Cancer     colon- colonoscopy 7/10 no evidence of recurrent    Medications:  No anticoagulants pta  Assessment: 56yom with hx CAD s/p stenting in 2011 presents to ED with CP. He will begin IV heparin. Troponin negative thus far. Hgb/Hct are a little low but he does have a hx of chronic anemia and this is baseline. Platelets and renal function wnl. INR slightly elevated.  Goal of Therapy:  Heparin level 0.3-0.7 units/ml Monitor platelets by anticoagulation protocol: Yes   Plan:  1) Heparin bolus 4000 units x 1 2) Heparin drip at 1250 units/hr 3) 6h heparin level 4) Daily heparin level and CBC  Fredrik Rigger 11/01/2011,5:19 PM

## 2011-11-01 NOTE — ED Provider Notes (Signed)
History     CSN: [id number]  Arrival date & time 11/01/11  1015   First MD Initiated Contact with Patient 11/01/11 1020      Chief Complaint  Patient presents with  . Chest Pain    (Consider location/radiation/quality/duration/timing/severity/associated sxs/prior treatment) HPI Comments: Pt is a 56 year old man who had onset of chest pain in the center of the chest around 3 A.M.  He took Mucinex.  He has a prior history of coronary artery disease and is followed for this by [facility].  He has had prior coronary artery stenting.  He was seen at [facility], where he was given nitroglycerin, with some relief, and was sent to [facility] ED by EMS.  He has a prior history of rectal cancer, and has had abdominoperineal resection for that. He has had some fluid in the pelvis, and was scheduled to have the fluid removed tomorrow.  Patient is a 56 y.o. male presenting with chest pain. The history is provided by the patient and medical records. No language interpreter was used.  Chest Pain The chest pain began 6 - 12 hours ago. Chest pain occurs constantly. The chest pain is unchanged. The pain is associated with breathing. At its most intense, the pain is at 8/10. The pain is currently at 8/10. The quality of the pain is described as pressure-like. The pain radiates to the left neck, right neck and upper back. Exacerbated by: Nothing. Primary symptoms include shortness of breath. He tried nitroglycerin, aspirin and oxygen for the symptoms. Risk factors: Known coronary artery disease.  His past medical history is significant for CAD and cancer.  His family medical history is significant for CAD in family and diabetes in family.  Procedure history is positive for cardiac catheterization.     Past Medical History  Diagnosis Date  . CAD (coronary artery disease)   . Hyperlipidemia   . Heart attack   . Blood in urine   . Cancer     colon    Past Surgical History    Procedure Date  . Proctoscopy   . Colon surgery     abdomeno perineal resection  . Ureteral repair   . Cardiac stents     Family History  Problem Relation Age of Onset  . Cancer Mother     breast  . Diabetes Father   . Colon cancer Neg Hx     History  Substance Use Topics  . Smoking status: Never Smoker   . Smokeless tobacco: Never Used  . Alcohol Use: No      Review of Systems  HENT: Negative.   Eyes: Negative.   Respiratory: Positive for shortness of breath.   Cardiovascular: Positive for chest pain.  Gastrointestinal: Negative.   Genitourinary: Negative.   Musculoskeletal: Negative.   Neurological: Negative.   Psychiatric/Behavioral: Negative.     Allergies  Review of patient's allergies indicates no known allergies.  Home Medications   Current Outpatient Rx  Name Route Sig Dispense Refill  . ASPIRIN 81 MG PO TABS Oral Take 81 mg by mouth daily.      . FUROSEMIDE 20 MG PO TABS Oral Take 20 mg by mouth 2 (two) times daily.      . IRON PO Oral Take by mouth daily.      Marland Kitchen MOVIPREP 100 G PO SOLR Oral Take 1 kit (100 g total) by mouth once. 1 kit 0    Dispense as written.  Marland Kitchen PANTOPRAZOLE SODIUM  40 MG PO TBEC Oral Take 40 mg by mouth daily.      Marland Kitchen POTASSIUM CHLORIDE CRYS ER 20 MEQ PO TBCR Oral Take by mouth 2 (two) times daily.      Marland Kitchen PRAVASTATIN SODIUM 20 MG PO TABS Oral Take 20 mg by mouth at bedtime.      Marland Kitchen PREDNISONE 10 MG PO TABS Oral Take 10 mg by mouth as needed.     Marland Kitchen PROCHLORPERAZINE MALEATE 10 MG PO TABS Oral Take 10 mg by mouth every 6 (six) hours as needed.      Marland Kitchen TAMSULOSIN HCL 0.4 MG PO CAPS      . VITAMIN B-12 1000 MCG PO TABS Oral Take 1,000 mcg by mouth daily.        BP 105/61  Pulse 98  Temp 99 F (37.2 C) (Oral)  Resp 21  SpO2 100%  Physical Exam  Nursing note and vitals reviewed. Constitutional: He is oriented to person, place, and time. He appears well-developed and well-nourished.       In moderate distress with chest pressure,  like an elephant on the chest.  HENT:  Head: Normocephalic and atraumatic.  Right Ear: External ear normal.  Left Ear: External ear normal.  Nose: Nose normal.  Mouth/Throat: Oropharynx is clear and moist.  Eyes: Conjunctivae and EOM are normal. Pupils are equal, round, and reactive to light.  Neck: Normal range of motion. Neck supple.  Cardiovascular: Normal rate, regular rhythm and normal heart sounds.   Pulmonary/Chest: Effort normal and breath sounds normal.  Abdominal: Soft.  Musculoskeletal: Normal range of motion. He exhibits no edema and no tenderness.  Neurological: He is alert and oriented to person, place, and time.       No sensory or motor deficit.  Skin: Skin is warm and dry.  Psychiatric: He has a normal mood and affect. His behavior is normal.    ED Course  Procedures (including critical care time)  10:20 AM  Date: 11/01/2011  Rate: 97  Rhythm: normal sinus rhythm  QRS Axis: normal  Intervals: normal  ST/T Wave abnormalities: nonspecific T wave changes  Conduction Disutrbances:nonspecific intraventricular conduction delay  Narrative Interpretation: Borderline EKG  Old EKG Reviewed: unchanged  12:35 PM Results for orders placed during the hospital encounter of 11/01/11  CBC      Component Value Range   WBC 10.1  4.0 - 10.5 K/uL   RBC 3.78 (*) 4.22 - 5.81 MIL/uL   Hemoglobin 10.6 (*) 13.0 - 17.0 g/dL   HCT 45.4 (*) 09.8 - 11.9 %   MCV 85.7  78.0 - 100.0 fL   MCH 28.0  26.0 - 34.0 pg   MCHC 32.7  30.0 - 36.0 g/dL   RDW 14.7  82.9 - 56.2 %   Platelets 199  150 - 400 K/uL  COMPREHENSIVE METABOLIC PANEL      Component Value Range   Sodium 138  135 - 145 mEq/L   Potassium 3.9  3.5 - 5.1 mEq/L   Chloride 103  96 - 112 mEq/L   CO2 27  19 - 32 mEq/L   Glucose, Bld 124 (*) 70 - 99 mg/dL   BUN 10  6 - 23 mg/dL   Creatinine, Ser 1.30  0.50 - 1.35 mg/dL   Calcium 8.5  8.4 - 86.5 mg/dL   Total Protein 7.7  6.0 - 8.3 g/dL   Albumin 3.0 (*) 3.5 - 5.2 g/dL    AST 27  0 - 37 U/L  ALT 13  0 - 53 U/L   Alkaline Phosphatase 85  39 - 117 U/L   Total Bilirubin 0.5  0.3 - 1.2 mg/dL   GFR calc non Af Amer >90  >90 mL/min   GFR calc Af Amer >90  >90 mL/min  PROTIME-INR      Component Value Range   Prothrombin Time 17.2 (*) 11.6 - 15.2 seconds   INR 1.38  0.00 - 1.49  APTT      Component Value Range   aPTT 32  24 - 37 seconds  URINALYSIS, ROUTINE W REFLEX MICROSCOPIC      Component Value Range   Color, Urine YELLOW  YELLOW   APPearance CLOUDY (*) CLEAR   Specific Gravity, Urine 1.020  1.005 - 1.030   pH 6.0  5.0 - 8.0   Glucose, UA NEGATIVE  NEGATIVE mg/dL   Hgb urine dipstick MODERATE (*) NEGATIVE   Bilirubin Urine NEGATIVE  NEGATIVE   Ketones, ur NEGATIVE  NEGATIVE mg/dL   Protein, ur NEGATIVE  NEGATIVE mg/dL   Urobilinogen, UA 0.2  0.0 - 1.0 mg/dL   Nitrite NEGATIVE  NEGATIVE   Leukocytes, UA MODERATE (*) NEGATIVE  URINE MICROSCOPIC-ADD ON      Component Value Range   Squamous Epithelial / LPF RARE  RARE   WBC, UA 21-50  <3 WBC/hpf   RBC / HPF 21-50  <3 RBC/hpf   Bacteria, UA FEW (*) RARE  POCT I-STAT TROPONIN I      Component Value Range   Troponin i, poc 0.01  0.00 - 0.08 ng/mL   Comment 3            Dg Chest Portable 1 View  11/01/2011  *RADIOLOGY REPORT*  Clinical Data: Chest pain, cough, weakness  PORTABLE CHEST - 1 VIEW  Comparison: Most recent chest x-ray 07/13/2010, CT scan of the abdomen and pelvis including the lung bases 10/09/2011  Findings: Right IJ approach portacatheter remains in unchanged position.  The catheter tip projects over the superior cavoatrial junction.  Progression of enlargement of the cardiac silhouette compared to 07/13/2010.  There is pulmonary vascular congestion, and indistinct pulmonary markings consistent with interstitial pulmonary edema. No definite pleural effusion.  No acute osseous finding.  IMPRESSION:  1. Cardiomegaly, pulmonary vascular congestion and mild interstitial edema consistent with CHF.   Of note, the degree of cardiomegaly has progressed compared to 07/13/2010.  2.  Right IJ approach portacatheter remains in unchanged and satisfactory position.  Original Report Authenticated By: HEATH    Lab workup showed no elevated troponin.  His chest x-ray showed CHF.  His urine showed a UTI.  He has had BP drop to 78 systolic since arrival, so IV fluid challenge of 500 ml normal saline was ordered.  IV Rocephin ordered for UTI.  [facility] called to see pt.      1. Chest pain   2. Congestive heart failure   3. UTI (lower urinary tract infection)     7:50 PM Pt was seen by [facility], who are admitting him for refractory chest pain.          Carleene Cooper III, MD 11/01/11 [phone]

## 2011-11-01 NOTE — ED Notes (Signed)
Dr. Ignacia Palma informed of pt Low BP and MD agreed Nitro is not to be started

## 2011-11-02 ENCOUNTER — Inpatient Hospital Stay (HOSPITAL_COMMUNITY): Payer: BC Managed Care – PPO

## 2011-11-02 ENCOUNTER — Encounter (HOSPITAL_COMMUNITY): Payer: Self-pay | Admitting: Radiology

## 2011-11-02 DIAGNOSIS — I059 Rheumatic mitral valve disease, unspecified: Secondary | ICD-10-CM

## 2011-11-02 DIAGNOSIS — I509 Heart failure, unspecified: Secondary | ICD-10-CM

## 2011-11-02 LAB — COMPREHENSIVE METABOLIC PANEL
AST: 11 U/L (ref 0–37)
Albumin: 2.7 g/dL — ABNORMAL LOW (ref 3.5–5.2)
Alkaline Phosphatase: 74 U/L (ref 39–117)
Chloride: 103 mEq/L (ref 96–112)
Potassium: 3.5 mEq/L (ref 3.5–5.1)
Total Bilirubin: 0.3 mg/dL (ref 0.3–1.2)
Total Protein: 7.6 g/dL (ref 6.0–8.3)

## 2011-11-02 LAB — CBC
MCHC: 32.3 g/dL (ref 30.0–36.0)
Platelets: 209 10*3/uL (ref 150–400)
RDW: 14.4 % (ref 11.5–15.5)
WBC: 9 10*3/uL (ref 4.0–10.5)

## 2011-11-02 LAB — CARDIAC PANEL(CRET KIN+CKTOT+MB+TROPI)
CK, MB: 1.6 ng/mL (ref 0.3–4.0)
Total CK: 71 U/L (ref 7–232)
Troponin I: <0.3 ng/mL (ref <0.30)

## 2011-11-02 LAB — D-DIMER, QUANTITATIVE: D-Dimer, Quant: 1.08 ug/mL-FEU — ABNORMAL HIGH (ref 0.00–0.48)

## 2011-11-02 LAB — HEMOGLOBIN A1C
Hgb A1c MFr Bld: 6.4 % — ABNORMAL HIGH (ref <5.7)
Mean Plasma Glucose: 137 mg/dL — ABNORMAL HIGH (ref <117)

## 2011-11-02 MED ORDER — MORPHINE SULFATE 2 MG/ML IJ SOLN
INTRAMUSCULAR | Status: AC
  Administered 2011-11-02: 2 mg via INTRAVENOUS
  Filled 2011-11-02: qty 1

## 2011-11-02 MED ORDER — IOHEXOL 350 MG/ML SOLN
100.0000 mL | Freq: Once | INTRAVENOUS | Status: AC | PRN
  Administered 2011-11-02: 100 mL via INTRAVENOUS

## 2011-11-02 MED ORDER — ENOXAPARIN SODIUM 40 MG/0.4ML ~~LOC~~ SOLN
40.0000 mg | SUBCUTANEOUS | Status: DC
  Administered 2011-11-02 – 2011-11-05 (×4): 40 mg via SUBCUTANEOUS
  Filled 2011-11-02 (×6): qty 0.4

## 2011-11-02 MED ORDER — HEPARIN BOLUS VIA INFUSION
2500.0000 [IU] | Freq: Once | INTRAVENOUS | Status: DC
Start: 2011-11-02 — End: 2011-11-02
  Filled 2011-11-02: qty 2500

## 2011-11-02 MED ORDER — HEPARIN BOLUS VIA INFUSION
2500.0000 [IU] | Freq: Once | INTRAVENOUS | Status: AC
  Administered 2011-11-02: 2500 [IU] via INTRAVENOUS
  Filled 2011-11-02: qty 2500

## 2011-11-02 MED ORDER — MORPHINE SULFATE 2 MG/ML IJ SOLN
2.0000 mg | Freq: Once | INTRAMUSCULAR | Status: AC
  Administered 2011-11-02: 2 mg via INTRAVENOUS

## 2011-11-02 MED ORDER — HEPARIN (PORCINE) IN NACL 100-0.45 UNIT/ML-% IJ SOLN
1900.0000 [IU]/h | INTRAMUSCULAR | Status: DC
  Filled 2011-11-02: qty 250

## 2011-11-02 NOTE — Progress Notes (Signed)
  Echocardiogram 2D Echocardiogram has been performed.  Adam Henderson 11/02/2011, 11:28 AM

## 2011-11-02 NOTE — Progress Notes (Signed)
PROGRESS NOTE  Subjective:   Adam Henderson is a 56 y.o. male with h/o CAD s/p stenting of second OM in 2011, ICM, colon cancer s/p abdominal perineal resection and bilateral stents in ureter in april 2012. Per Dr. Neita Garnet notes in [phone] his final pathology report showed metastasis in 13/15 lymph nodes. He presents today with midsternal chest pain which woke him up from sleep this morning at 3am.He describes it as constant at 9/10 radiates to back,neck and head. It is worsened with activity and feels like "someone standing on my chest". It was associated with SOB,palpitations,dizziness and right leg pain.He took one mucinex pill and went back to sleep but pain was not relieved . 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 He got a 2nd NTG SL by EMS, pain went down to 7/10 and was transferred to [facility] for further eval.He has had 4 ASA . Currently, he rates his chest pain at 7/10 with deep inspiration. Of note , he states at [facility], they found fluid in his pelvis and is scheduled for removal tomorrow but has to call to postpone since he is now hospitalized.  He had significant pleuritic CP yesterday - this is better this am after being on Heparin.  Objective:    Vital Signs:   Temp:  [98.5 F (36.9 C)-99.7 F (37.6 C)] 98.6 F (37 C) (08/01 0500) Pulse Rate:  [52-99] 78  (08/01 0500) Resp:  [5-32] 18  (08/01 0500) BP: (78-113)/(45-72) 107/72 mmHg (08/01 0500) SpO2:  [96 %-100 %] 99 % (08/01 0500) Weight:  [209 lb (94.802 kg)-215 lb 12.8 oz (97.886 kg)] 215 lb 12.8 oz (97.886 kg) (08/01 0500)      24-hour weight change: Weight change:   Weight trends: Filed Weights   11/01/11 1225 11/01/11 2047 11/02/11 0500  Weight: 209 lb (94.802 kg) 209 lb (94.802 kg) 215 lb 12.8 oz (97.886 kg)     Intake/Output:        Physical Exam: BP 107/72  Pulse 78  Temp 98.6 F (37 C) (Oral)  Resp 18  Ht 5' 9.5" (1.765 m)  Wt 215 lb 12.8 oz (97.886 kg)  BMI 31.41 kg/m2  SpO2 99%  General: Vital signs reviewed and noted. Well-developed, well-nourished, in no acute distress; alert, appropriate and cooperative .  Head: Normocephalic, atraumatic.  Eyes: conjunctivae/corneas clear.  EOM's intact.   Throat: normal  Neck: Supple. Normal carotids. No JVD  Lungs:  Clear to auscultation  Heart: Regular rate,  With normal  S1 S2. No murmurs, gallops or rubs  Abdomen:  Soft, non-tender, non-distended with normoactive bowel sounds. No hepatomegaly. No rebound/guarding. No abdominal masses.  Extremities: Distal pedal pulses are 2+ .  No edema.    Neurologic: A&O X3, CN II - XII are grossly intact. Motor strength is 5/5 in the all 4 extremities.  Psych: Responds to questions appropriately with normal affect.    Labs: BMET:  Basename 11/02/11 0605 11/01/11 1112  NA 138 138  K 3.5 3.9  CL 103 103  CO2 24 27  GLUCOSE 140* 124*  BUN 9 10  CREATININE 0.63 0.72  CALCIUM 8.7 8.5  MG -- --  PHOS -- --    Liver function tests:  [facility] 11/02/11 0605 11/01/11 1112  AST 11 27  ALT 10 13  ALKPHOS 74 85  BILITOT 0.3 0.5  PROT 7.6 7.7  ALBUMIN 2.7* 3.0*   No results found for this basename: LIPASE:2,AMYLASE:2 in the last 72 hours  CBC:  Basename 11/02/11 0605 11/01/11 1112  WBC 9.0 10.1  NEUTROABS -- --  HGB 10.5* 10.6*  HCT 32.5* 32.4*  MCV 86.9 85.7  PLT 209 199    Cardiac Enzymes:  Basename 11/02/11 0605 11/02/11 0035 11/01/11 1838  CKTOTAL 72 71 61  CKMB 1.9 1.6 1.4  TROPONINI <0.30 <0.30 <0.30    Coagulation Studies:  Basename 11/01/11 1112  LABPROT 17.2*  INR 1.38    Other: No components found with this basename: POCBNP:3  Basename 11/02/11 0035  DDIMER 1.08*    Basename 11/01/11 1838  HGBA1C 6.4*   No results found for this basename:  CHOL,HDL,LDLCALC,TRIG,CHOLHDL in the last 72 hours  Basename 11/01/11 1838  TSH 0.675  T4TOTAL --  T3FREE --  THYROIDAB --   No results found for this basename: VITAMINB12,FOLATE,FERRITIN,TIBC,IRON,RETICCTPCT in the last 72 hours    Tele:  NSR  Medications:    Infusions:    . heparin 1,600 Units/hr (11/02/11 0721)  . DISCONTD: sodium chloride 500 mL (11/01/11 1321)    Scheduled Medications:    . sodium chloride   Intravenous Once  . aspirin  324 mg Oral Once  . aspirin  81 mg Oral Once  . aspirin EC  81 mg Oral Daily  . cefTRIAXone (ROCEPHIN)  IV  1 g Intravenous Once  . ferrous sulfate  325 mg Oral Once  . ferrous sulfate  325 mg Oral Daily  . furosemide  20 mg Oral Daily  . heparin  2,500 Units Intravenous Once  . heparin  4,000 Units Intravenous Once  . nitroGLYCERIN  5 mcg/min Intravenous Once  . pantoprazole  40 mg Oral Daily  . potassium chloride SA  20 mEq Oral BID  . prochlorperazine  10 mg Oral Once  . simvastatin  10 mg Oral q1800  . sodium chloride  3 mL Intravenous Q12H  . Tamsulosin HCl  0.4 mg Oral QPC supper  . vitamin B-12  1,000 mcg Oral Daily  . vitamin B-12  1,000 mcg Oral Once  . vitamin C  500 mg Oral Daily  . vitamin C  500 mg Oral Once  . DISCONTD: aspirin  81 mg Oral Daily  . DISCONTD: Iron  325 mg Oral Daily  . DISCONTD: simvastatin  10 mg Oral q1800    Assessment/ Plan:    1. CAD:  Has ruled out for MI.   May need further eval if CT angio is negative.  Chest pain, central (11/01/2011)  D-dimer is elevated.  Presented with pleuritic cp.  Will get a CT angio of the chest to look for PE.   Colon cancer (11/01/2011)  for MRI of abdomen at some point.   Disposition: CT angio today. Length of Stay: 1  Vesta Mixer, Montez Hageman., MD, [facility] 11/02/2011, 8:34 AM Office [phone] Pager [phone]

## 2011-11-02 NOTE — Consult Note (Signed)
ANTICOAGULATION CONSULT NOTE - Follow-up Consult  Pharmacy Consult for Heparin Indication: chest pain/ACS  No Known Allergies  Patient Measurements: Height: 5' 9.5" (176.5 cm) Weight: 215 lb 12.8 oz (97.886 kg) IBW/kg (Calculated) : 71.85  Heparin Dosing Weight: 89kg  Vital Signs: Temp: 98.6 F (37 C) (08/01 0500) Temp src: Oral (08/01 0500) BP: 107/72 mmHg (08/01 0500) Pulse Rate: 78  (08/01 0500)  Labs:  Basename 11/02/11 0948 11/02/11 0605 11/02/11 0035 11/01/11 1838 11/01/11 1112  HGB -- 10.5* -- -- 10.6*  HCT -- 32.5* -- -- 32.4*  PLT -- 209 -- -- 199  APTT -- -- -- -- 32  LABPROT -- -- -- -- 17.2*  INR -- -- -- -- 1.38  HEPARINUNFRC 0.13* -- <0.10* -- --  CREATININE -- 0.63 -- -- 0.72  CKTOTAL -- 72 71 61 --  CKMB -- 1.9 1.6 1.4 --  TROPONINI -- <0.30 <0.30 <0.30 --    Estimated Creatinine Clearance: 122.9 ml/min (by C-G formula based on Cr of 0.63).   Medical History: Past Medical History  Diagnosis Date  . CAD (coronary artery disease)     CATH 02/15/10-thrombectomy and stent in second obtuse marginal   . Hyperlipidemia   . Heart attack   . Blood in urine   . Cancer     colon- colonoscopy 7/10 no evidence of recurrent       Assessment: 56yom with hx CAD s/p stenting in 2011 presents to ED with CP, Trop neg x 3. D-dimer is elevated at 1.08.  Cardiology has seen and ruled out ACS.  Plan to check CTA to r/o PE. Heparin level recheck = 0.13 after increasing rate this am to 1600 units/hr. Hgb and platelets are stable. No signs of bleeding.   Goal of Therapy:  Heparin level 0.3-0.7 units/ml Monitor platelets by anticoagulation protocol: Yes   Plan:  rebolus 2500 units and increase to 1900 units/hr  Recheck HL in 6 hours vs f/u CTA results and need to continue heparin gtt.   Dannielle Huh 11/02/2011,10:42 AM

## 2011-11-02 NOTE — H&P (Cosign Needed)
CARDIOLOGY History and Physical   Patient ID: Adam Henderson MRN: [id number] DOB/AGE: 54-Apr-1957 35 y.o.  Admit date: 11/01/2011  Primary Physician   [name], MD Primary Cardiologist [name] Reason for Consultation  Chest pain and SOB  WUJ:Adam Henderson is a 56 y.o. male with h/o CAD s/p stenting of second OM in 2011, ICM, colon cancer s/p abdominal perineal resection and bilateral stents in ureter in april 2012. Per Dr. Neita Garnet notes in [phone] his final pathology report showed metastasis in 13/15 lymph nodes. He presents today with midsternal chest pain which woke him up from sleep this morning at 3am.He describes it as constant at 9/10 radiates to back,neck and head. It is worsened with activity and feels like "someone standing on my chest". It was associated with SOB,palpitations,dizziness and right leg pain.He took one mucinex pill and went back to sleep but pain was not relieved . 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 He got a 2nd NTG SL by EMS, pain went down to 7/10 and was transferred to [facility] for further eval.He has had 4 ASA . Currently, he rates his chest pain at 7/10 with deep inspiration. Of note , he states at [facility], they found fluid in his pelvis and is scheduled for removal tomorrow but has to call to postpone since he is now hospitalized.    Past Medical History  Diagnosis Date  . CAD (coronary artery disease)     CATH 02/15/10-thrombectomy and stent in second obtuse marginal   . Hyperlipidemia   . Heart attack   . Blood in urine   . Cancer     colon- colonoscopy 7/10 no evidence of recurrent     Past Surgical History  Procedure Date  . Proctoscopy   . Ureteral repair   . Cardiac stents   . Colon surgery     abdomeno perineal resection  . Colostomy   .  Portacath placement     No Known Allergies  I have reviewed the patient's current medications    . sodium chloride   Intravenous Once  . aspirin  324 mg Oral Once  . cefTRIAXone (ROCEPHIN)  IV  1 g Intravenous Once  . nitroGLYCERIN  5 mcg/min Intravenous Once      . sodium chloride 500 mL (11/01/11 1321)     Prior to Admission medications   Medication Sig Start Date End Date Taking? Authorizing Provider  aspirin 81 MG tablet Take 81 mg by mouth daily.      Historical Provider, MD  furosemide (LASIX) 20 MG tablet Take 20 mg by mouth 2 (two) times daily.      Historical Provider, MD  IRON PO Take by mouth daily.      Historical Provider, MD  MOVIPREP 100 G SOLR Take 1 kit (100 g total) by mouth once. 10/04/11   Hart Carwin, MD  pantoprazole (PROTONIX) 40 MG tablet Take 40 mg by mouth daily.      Historical Provider, MD  potassium chloride SA (K-DUR,KLOR-CON) 20 MEQ tablet Take by mouth 2 (two) times daily.      Historical Provider, MD  pravastatin (PRAVACHOL) 20 MG tablet Take 20 mg by mouth at bedtime.      Historical Provider, MD  predniSONE (DELTASONE) 10 MG tablet Take 10 mg  by mouth as needed.     Historical Provider, MD  prochlorperazine (COMPAZINE) 10 MG tablet Take 10 mg by mouth every 6 (six) hours as needed.      Historical Provider, MD  Tamsulosin HCl (FLOMAX) 0.4 MG CAPS  08/11/11   Historical Provider, MD  vitamin B-12 (CYANOCOBALAMIN) 1000 MCG tablet Take 1,000 mcg by mouth daily.      Historical Provider, MD     History   Social History  . Marital Status: Single    Spouse Name: N/A    Number of Children: N/A  . Years of Education: N/A   Occupational History  . Not on file.   Social History Main Topics  . Smoking status: Never Smoker   . Smokeless tobacco: Never Used  . Alcohol Use: No  . Drug Use: No  . Sexually Active: Not on file   Other Topics Concern  . Not on file   Social History Narrative  . No narrative on file     Family History    Problem Relation Age of Onset  . Cancer Mother     breast  . Diabetes Father   . Colon cancer Neg Hx      ROS:  +SOB,weakness,dizziness, palpitations,chest pain. Physical Exam: Blood pressure 99/60, pulse 98, temperature 99.7 F (37.6 C), temperature source Oral, resp. rate 32, height 5\' 9"  (1.753 m), weight 209 lb (94.802 kg), SpO2 98.00%.  General: Well developed, well nourished, male in no acute distress Head: Eyes PERRLA, No xanthomas.   Normocephalic and atraumatic, oropharynx without edema or exudate. Some             teeth missing. Chest: (R) porta-cath Lungs: bilateral breath sounds clear. Heart: HRRR S1 S2, no rub/gallop,  pulses are 2+ extrem.   Neck: No carotid bruits. No lymphadenopathy.  No JVD. Abdomen: LLQ colostomy bag. Bowel sounds present, abdomen soft and non-tender without hernias noted. Msk:  + Weakness. No spine or cva tenderness. , No joint deformities or effusions. Extremities: Trace edema. No clubbing or cyanosis.  Neuro: Alert and oriented X 3. No focal deficits noted. Psych:  Good affect, responds appropriately Skin: No rashes or lesions noted.  Labs:   Lab Results  Component Value Date   WBC 10.1 11/01/2011   HGB 10.6* 11/01/2011   HCT 32.4* 11/01/2011   MCV 85.7 11/01/2011   PLT 199 11/01/2011    Basename 11/01/11 1112  INR 1.38    Lab 11/01/11 1112  NA 138  K 3.9  CL 103  CO2 27  BUN 10  CREATININE 0.72  CALCIUM 8.5  PROT 7.7  BILITOT 0.5  ALKPHOS 85  ALT 13  AST 27  GLUCOSE 124*    Basename 11/01/11 1128  TROPIPOC 0.01    Echo: 10/03/2005 SUMMARY - The left ventricle was mildly dilated. Overall left ventricular systolic function was markedly decreased. Left ventricular ejection fraction was estimated , range being 25 % to 35 %. This study was inadequate for the evaluation of left ventricular regional wall motion. IMPRESSIONS - If clinically indicated, transesophageal echocardiography could provide additional  information.   ECG:  01-Nov-2011 10:15:28  SINUS RHYTHM ~ normal P axis, V-rate 50- 99 BORDERLINE INTRAVENTRICULAR CONDUCTION DELAY ~ QRSd >130mS BORDERLINE T ABNORMALITIES, DIFFUSE LEADS ~ T flat/neg Standard 12 Lead Report ~ Unconfirmed Interpretation Borderline ECG 62mm/s 74mm/mV 150Hz  8.0.1 12SL 235 CID: [id number] Referred by: Unconfirmed Vent. rate 97 BPM PR interval 164 ms QRS duration 108 ms QT/QTc 340/432 ms P-R-T axes  63 50 -81  Radiology:  Dg Chest Portable 1 View 11/01/2011  *RADIOLOGY REPORT*  Clinical Data: Chest pain, cough, weakness  PORTABLE CHEST - 1 VIEW  Comparison: Most recent chest x-ray 07/13/2010, CT scan of the abdomen and pelvis including the lung bases 10/09/2011  Findings: Right IJ approach portacatheter remains in unchanged position.  The catheter tip projects over the superior cavoatrial junction.  Progression of enlargement of the cardiac silhouette compared to 07/13/2010.  There is pulmonary vascular congestion, and indistinct pulmonary markings consistent with interstitial pulmonary edema. No definite pleural effusion.  No acute osseous finding.  IMPRESSION:  1. Cardiomegaly, pulmonary vascular congestion and mild interstitial edema consistent with CHF.  Of note, the degree of cardiomegaly has progressed compared to 07/13/2010.  2.  Right IJ approach portacatheter remains in unchanged and satisfactory position.  Original Report Authenticated By: HEATH    ASSESSMENT AND PLAN:     1. [place]/CAD - Pt presents with midsternal chest pain which he currently describes as sharp/pressure at  7/10 and worse with deep inspiration. He has received 4 baby ASA. His first trop is normal. ECG non-acute. Will admit to telemetry,cycle CE,check lipids,CBCs,ECGs,BMET daily.  2. Colon cancer- he is s/p chemotherapy/radiation and follows up with Dr. [name]. Per his notes in April 2012, pt's pathology report showed metastatic cancer in 13/15 lymph nodes. He has had chemotherapy and there was  no evidence of recurrence at 1 year. I am unsure whether his cancer has metastasized further and contributing to his chest pain with radiation to neck,back and head. Will order CT/MRI. 3. CHF exacerbation-  EF 25-35% per echo (10/2005). CXR shows mild interstitial edema. He  has mild edema legs and mild SOB with activity. MD advise on Lasix 40 IV and follow weights, I/Os, then resume home dose Lasix when his volume status improves. 4. Hypotension- improving after fluid bolus. 5. ?UTI- urine positive for blood/leucocytes and few bacteria. He currently has low grade fever. Antibiotics per ER MD. Consider culture.       6    Fever- this is low-grade.- This could be due to his UTI above and ? fluid which he states is in his pelvis,which                                                           is due for removal tomorrow at [facility]. Tylenol PRN        7. Anemia- this is chronic and he is at his baseline. Initiate home meds   Signed: Theodore Demark 11/01/2011, 1:43 PM Co-Sign MD Patient seen and examined and history reviewed. Agree with above findings and plan. Patient presents with refractory chest pain. He has atypical pain that is worse with deep inspiration. Some relief but incomplete with Ntg. Known history of CAD. History of metastatic colon CA. Recent fluid collection in pelvis scheduled for drainage procedure tomorrow. Exam reveals some chest wall pain to palpation. Lungs are fairly clear after 1.5 liters of IV fluid. CXR shows cardiomegaly. I am not impressed that he has much edema. Ecg does show new ST-T depression in the inferolateral leads. Will cycle cardiac enzymes and Ecgs. Check BNP and d-dimer. Echo. If d-dimer is significantly elevated consider CT of chest to rule out PE. May need further ischemic workup depending on results of  above studies tonight.  Theron Arista [facility] 11/01/2011 3:31 PM

## 2011-11-02 NOTE — Progress Notes (Signed)
UR Completed Arlyn Bumpus Graves-Bigelow, RN,BSN [phone]  

## 2011-11-02 NOTE — Consult Note (Signed)
ANTICOAGULATION CONSULT NOTE - Initial Consult  Pharmacy Consult for Heparin Indication: chest pain/ACS  No Known Allergies  Patient Measurements: Height: 5' 9.5" (176.5 cm) Weight: 209 lb (94.802 kg) IBW/kg (Calculated) : 71.85  Heparin Dosing Weight: 89kg  Vital Signs: Temp: 99.1 F (37.3 C) (07/31 2047) Temp src: Oral (07/31 2047) BP: 102/65 mmHg (07/31 2047) Pulse Rate: 99  (07/31 2047)  Labs:  Basename 11/02/11 0035 11/01/11 1838 11/01/11 1112  HGB -- -- 10.6*  HCT -- -- 32.4*  PLT -- -- 199  APTT -- -- 32  LABPROT -- -- 17.2*  INR -- -- 1.38  HEPARINUNFRC <0.10* -- --  CREATININE -- -- 0.72  CKTOTAL 71 61 --  CKMB 1.6 1.4 --  TROPONINI <0.30 <0.30 --    Estimated Creatinine Clearance: 121.1 ml/min (by C-G formula based on Cr of 0.72).   Medical History: Past Medical History  Diagnosis Date  . CAD (coronary artery disease)     CATH 02/15/10-thrombectomy and stent in second obtuse marginal   . Hyperlipidemia   . Heart attack   . Blood in urine   . Cancer     colon- colonoscopy 7/10 no evidence of recurrent    Medications:  No anticoagulants pta      Assessment: HL is <0.1 at 1250 units/hr with no bleeding or infusion complications Goal of Therapy:  Heparin level 0.3-0.7 units/ml Monitor platelets by anticoagulation protocol: Yes   Plan:  rebolus 2500 units and increase to 1600 units/hr  Recheck HL in 6 hours.  Janice Coffin 11/02/2011,4:01 AM

## 2011-11-02 NOTE — Care Management Note (Unsigned)
    Page 1 of 1   11/02/2011     4:51:34 PM   CARE MANAGEMENT NOTE 11/02/2011  Patient:  Adam Henderson, Adam Henderson   Account Number:  [id number]  Date Initiated:  11/02/2011  Documentation initiated by:  GRAVES-BIGELOW,Fuquan Wilson  Subjective/Objective Assessment:   Pt admitted with cp and SOB. Low Bp in Ed and had to receive a 1.5 liter fluid bolus.     Action/Plan:   Pt is from home. CM will continue to monitor for disposition needs. Pt may benefit from [facility] RN services.   Anticipated DC Date:  11/06/2011   Anticipated DC Plan:  HOME W HOME HEALTH SERVICES         Choice offered to / List presented to:             Status of service:  In process, will continue to follow Medicare Important Message given?   (If response is "NO", the following Medicare IM given date fields will be blank) Date Medicare IM given:   Date Additional Medicare IM given:    Discharge Disposition:    Per UR Regulation:  Reviewed for med. necessity/level of care/duration of stay  If discussed at Long Length of Stay Meetings, dates discussed:    Comments:

## 2011-11-02 NOTE — Progress Notes (Signed)
Pt c/o chest pressure 10/10. EKG unchanged. Ward Givens NP notified morphine 2mg  ordered. 1905 Morphine given pt stated pressure relieved. Continue to monitor pt.

## 2011-11-03 ENCOUNTER — Encounter (HOSPITAL_COMMUNITY): Payer: Self-pay | Admitting: Nurse Practitioner

## 2011-11-03 DIAGNOSIS — R188 Other ascites: Secondary | ICD-10-CM | POA: Clinically undetermined

## 2011-11-03 DIAGNOSIS — I428 Other cardiomyopathies: Secondary | ICD-10-CM | POA: Diagnosis present

## 2011-11-03 DIAGNOSIS — R911 Solitary pulmonary nodule: Secondary | ICD-10-CM | POA: Diagnosis present

## 2011-11-03 DIAGNOSIS — I5022 Chronic systolic (congestive) heart failure: Secondary | ICD-10-CM

## 2011-11-03 LAB — CBC
HCT: 34.1 % — ABNORMAL LOW (ref 39.0–52.0)
Hemoglobin: 11.1 g/dL — ABNORMAL LOW (ref 13.0–17.0)
MCV: 86.5 fL (ref 78.0–100.0)
RDW: 14.5 % (ref 11.5–15.5)
WBC: 11.3 10*3/uL — ABNORMAL HIGH (ref 4.0–10.5)

## 2011-11-03 MED ORDER — MORPHINE SULFATE 2 MG/ML IJ SOLN
2.0000 mg | INTRAMUSCULAR | Status: DC | PRN
  Administered 2011-11-03 – 2011-11-05 (×5): 2 mg via INTRAVENOUS
  Filled 2011-11-03 (×4): qty 1

## 2011-11-03 MED ORDER — CARVEDILOL 3.125 MG PO TABS
3.1250 mg | ORAL_TABLET | Freq: Two times a day (BID) | ORAL | Status: DC
  Administered 2011-11-03 – 2011-11-06 (×7): 3.125 mg via ORAL
  Filled 2011-11-03 (×9): qty 1

## 2011-11-03 MED ORDER — FUROSEMIDE 10 MG/ML IJ SOLN
40.0000 mg | Freq: Two times a day (BID) | INTRAMUSCULAR | Status: DC
  Administered 2011-11-03 – 2011-11-04 (×3): 40 mg via INTRAVENOUS
  Filled 2011-11-03 (×4): qty 4

## 2011-11-03 MED ORDER — MORPHINE SULFATE 2 MG/ML IJ SOLN
INTRAMUSCULAR | Status: AC
  Administered 2011-11-03: 2 mg via INTRAVENOUS
  Filled 2011-11-03: qty 1

## 2011-11-03 NOTE — Progress Notes (Signed)
Patient Name: Adam Henderson Date of Encounter: 11/03/2011     Principal Problem:  *Chest pain, central Active Problems:  CAD  Ischemic cardiomyopathy  Pelvic fluid collection  Chronic systolic CHF (congestive heart failure)  Colon cancer  LUL Pulm Nodule  SUBJECTIVE  Episode of focal left chest, posterior right shoulder, and left back pain last night.  Worse with palpation and deep breathing, somewhat better by walking around.  Completely relieved with morphine after about 2 hrs.  No chest pain this AM.  CURRENT MEDS    . aspirin  81 mg Oral Once  . aspirin EC  81 mg Oral Daily  . enoxaparin (LOVENOX) injection  40 mg Subcutaneous Q24H  . ferrous sulfate  325 mg Oral Daily  . furosemide  20 mg Oral Daily  .  morphine injection  2 mg Intravenous Once  . nitroGLYCERIN  5 mcg/min Intravenous Once  . pantoprazole  40 mg Oral Daily  . potassium chloride SA  20 mEq Oral BID  . simvastatin  10 mg Oral q1800  . sodium chloride  3 mL Intravenous Q12H  . Tamsulosin HCl  0.4 mg Oral QPC supper  . vitamin B-12  1,000 mcg Oral Daily  . vitamin C  500 mg Oral Daily  . DISCONTD: heparin  2,500 Units Intravenous Once    OBJECTIVE  Filed Vitals:   11/02/11 1336 11/02/11 1850 11/02/11 2100 11/03/11 0300  BP: 103/68 133/77 135/82 102/71  Pulse: 84 91 96 83  Temp: 97.6 F (36.4 C)  98.9 F (37.2 C) 98.4 F (36.9 C)  TempSrc: Oral  Oral Oral  Resp: 21     Height:      Weight:    214 lb (97.07 kg)  SpO2: 99% 99% 98% 100%    Intake/Output Summary (Last 24 hours) at 11/03/11 0753 Last data filed at 11/02/11 1300  Gross per 24 hour  Intake    600 ml  Output      0 ml  Net    600 ml   Filed Weights   11/01/11 2047 11/02/11 0500 11/03/11 0300  Weight: 209 lb (94.802 kg) 215 lb 12.8 oz (97.886 kg) 214 lb (97.07 kg)   PHYSICAL EXAM  General: Pleasant, NAD. Neuro: Alert and oriented X 3. Moves all extremities spontaneously. Psych: Normal affect. HEENT:  Normal  Neck:  Supple without bruits.  Mild elevation of neck veins. Lungs:  Resp regular and unlabored.  Diminished breath sounds @ bases. Heart: RRR no s3.  + s4.  No murmurs. Abdomen: Soft, non-tender, non-distended, BS + x 4.  Extremities: No clubbing, cyanosis or edema. DP/PT/Radials 2+ and equal bilaterally.  Accessory Clinical Findings  CBC  Basename 11/03/11 0600 11/02/11 0605  WBC 11.3* 9.0  NEUTROABS -- --  HGB 11.1* 10.5*  HCT 34.1* 32.5*  MCV 86.5 86.9  PLT 253 209   Basic Metabolic Panel  Basename 11/02/11 0605 11/01/11 1112  NA 138 138  K 3.5 3.9  CL 103 103  CO2 24 27  GLUCOSE 140* 124*  BUN 9 10  CREATININE 0.63 0.72  CALCIUM 8.7 8.5  MG -- --  PHOS -- --   Liver Function Tests  Basename 11/02/11 0605 11/01/11 1112  AST 11 27  ALT 10 13  ALKPHOS 74 85  BILITOT 0.3 0.5  PROT 7.6 7.7  ALBUMIN 2.7* 3.0*   Cardiac Enzymes  Basename 11/02/11 0605 11/02/11 0035 11/01/11 1838  CKTOTAL 72 71 61  CKMB 1.9 1.6 1.4  CKMBINDEX -- -- --  TROPONINI <0.30 <0.30 <0.30   D-Dimer  Basename 11/02/11 0035  DDIMER 1.08*   Hemoglobin A1C  Basename 11/01/11 1838  HGBA1C 6.4*   Thyroid Function Tests  Basename 11/01/11 1838  TSH 0.675  T4TOTAL --  T3FREE --  THYROIDAB --   TELE  Rsr, occas pvc's.  ECG  Sinus, 86, t flattening I, twi aVL.  ST changes in II, III, aVF, V5,V6 which were present on admission ecg, no longer present.  Radiology/Studies  X-ray Chest Pa And Lateral  11/01/2011  *RADIOLOGY REPORT*  Clinical Data: Chest pain.  Chest pain, shortness of breath and fever.  CHEST - 2 VIEW  Comparison: Chest x-ray 11/01/2011.  Findings: A right internal jugular single lumen power Port-A-Cath is in position with tip terminating in the distal superior vena cava.  Lung volumes are normal. There is cephalization of the pulmonary vasculature and slight indistinctness of the interstitial markings suggestive of mild pulmonary edema.  No focal airspace consolidation.   No pleural effusions.  Mild cardiomegaly is unchanged.  Mediastinal contours are unremarkable.  IMPRESSION: 1.  Appearance of the chest is again most compatible with mild congestive heart failure.  Original Report Authenticated By: Florencia Reasons, M.D.   Ct Angio Chest Pe W/cm &/or Wo Cm  11/02/2011  *RADIOLOGY REPORT*  Clinical Data: Chest pain and pressure, history of colon cancer  CT ANGIOGRAPHY CHEST  Technique:  Multidetector CT imaging of the chest using the standard protocol during bolus administration of intravenous contrast. Multiplanar reconstructed images including MIPs were obtained and reviewed to evaluate the vascular anatomy.  Contrast: OMNIPAQUE IOHEXOL 350 MG/ML SOLN  Comparison: None.  Findings: There is mild, basilar predominant, smooth thickening of the interlobular septa and intralobular interstitia, as well as small bilateral pleural effusions, consistent with mild pulmonary edema.  Moderate cardiomegaly is also present.  There are no filling defects within either pulmonary arterial system to suggest a segmental or subsegmental pulmonary embolus. The thoracic aorta has a normal caliber and appearance.  Coronary artery atherosclerotic calcification is noted.  There is no axillary, mediastinal, or hilar adenopathy.  There is a 6 mm solitary pulmonary nodule in the anterior left upper lobe on image 32 ( If the patient is at high risk for bronchogenic carcinoma, follow-up chest CT at 6-12 months is recommended.  If the patient is at low risk for bronchogenic carcinoma, follow-up chest CT at 12 months is recommended.  This recommendation follows the consensus statement: Guidelines for Management of Small Pulmonary Nodules Detected on CT Scans: A Statement from the Fleischner Society as published in Radiology 2005; 237:395-400. ) The osseous structures and visualized upper abdomen are unremarkable.  IMPRESSION: Cardiomegaly and mild interstitial edema with small pleural effusions.  There  is no evidence of pulmonary embolus.  Solitary 6 mm left upper lobe pulmonary nodule.  Please see above recommendations.  Original Report Authenticated By: Brandon Melnick, M.D.   Dg Chest Portable 1 View  11/01/2011  *RADIOLOGY REPORT*  Clinical Data: Chest pain, cough, weakness  PORTABLE CHEST - 1 VIEW  Comparison: Most recent chest x-ray 07/13/2010, CT scan of the abdomen and pelvis including the lung bases 10/09/2011  Findings: Right IJ approach portacatheter remains in unchanged position.  The catheter tip projects over the superior cavoatrial junction.  Progression of enlargement of the cardiac silhouette compared to 07/13/2010.  There is pulmonary vascular congestion, and indistinct pulmonary markings consistent with interstitial pulmonary edema. No definite pleural effusion.  No  acute osseous finding.  IMPRESSION:  1. Cardiomegaly, pulmonary vascular congestion and mild interstitial edema consistent with CHF.  Of note, the degree of cardiomegaly has progressed compared to 07/13/2010.  2.  Right IJ approach portacatheter remains in unchanged and satisfactory position.  Original Report Authenticated By: HEATH   2D Echocardiogram 11/02/2011  Study Conclusions  - Left ventricle: Poor acoustic windows limit study. LVEF is   apprxoimately 30 to 35% with severe hypokinesis of the   inferior, septlal and posterior walls. The cavity size was   severely dilated. - Mitral valve: Mild regurgitation. - Left atrium: The atrium was moderately dilated. _____________  ASSESSMENT AND PLAN  1.  Pleuritic chest pain with chest wall tenderness/CAD:  Pt continues to have intermittent chest pain which is not responsive to nitrates.  Despite intermittent Ss, enzymes have been negative.  ECG during pain last night actually looked better than admission ecg.  CTA yesterday did not show PE.  With history of CAD and ongoing pain, will need to consider ischemic evaluation while he is hospitalized.  Will d/w Dr.  Elease Hashimoto.  2.  Acute on chronic systolic CHF/ICM:  EF by echo yesterday 30-35%, slightly improved from previous reading in 2007.  CXR 7/31 and CT 8/1 both show evidence of CHF.  If accurate, weights have been rising.  He has diminished breath sounds and mild JVD.  Will switch to IV lasix today (so far he has only been on 20mg  po daily).  Pt not currently on bb, acei/arb.  BP marginal.  Diurese today with eye toward initiating bb tomorrow and if bp tolerates and creat stable with diuresis, acei afterwards.  He hasn't followed up in a few years based on EPIC notes.  We will need to maximize his meds as an outpt, repeat echo in a few months on max therapy, and then consider candidacy for ICD.  3.  Colon CA:  S/p prior surgery.  Per his report, he has pelvic fluid pending aspiration in [place] (Dr. Uvaldo Bristle).  Signed, Nicolasa Ducking NP  Attending Note:   The patient was seen and examined.  Agree with assessment and plan as noted above.  The CT angio yesterday was negative for PE.  She has continued to have the episodes of severe CP- elephant on his chest with radiation to his back.   Will order a stress myoview for tomorrow.    2. CHF: EF is 30-35% .  Will start low dose Coreg - 3.125 BID Getting lasix.     Vesta Mixer, Montez Hageman., MD, [facility] 11/03/2011, 12:53 PM

## 2011-11-03 NOTE — Progress Notes (Signed)
Patient complaining of chest pain radiating to the back as a sharp sensation an 8/10. Patient appears to be SOB. Vital signs stable. Patient given 2 mg of morphine IV, and placed on 2L N/C for comfort. Dr. On call notified. Pain relieved by morphine, patient resting comfortably. Will continue to monitor paitent.

## 2011-11-04 ENCOUNTER — Inpatient Hospital Stay (HOSPITAL_COMMUNITY): Payer: BC Managed Care – PPO

## 2011-11-04 DIAGNOSIS — I2510 Atherosclerotic heart disease of native coronary artery without angina pectoris: Secondary | ICD-10-CM

## 2011-11-04 LAB — CBC
HCT: 38.3 % — ABNORMAL LOW (ref 39.0–52.0)
Hemoglobin: 12.2 g/dL — ABNORMAL LOW (ref 13.0–17.0)
MCV: 86.1 fL (ref 78.0–100.0)
RDW: 14.2 % (ref 11.5–15.5)
WBC: 8.8 10*3/uL (ref 4.0–10.5)

## 2011-11-04 LAB — BASIC METABOLIC PANEL
BUN: 11 mg/dL (ref 6–23)
CO2: 28 mEq/L (ref 19–32)
Chloride: 99 mEq/L (ref 96–112)
Creatinine, Ser: 0.75 mg/dL (ref 0.50–1.35)
Glucose, Bld: 122 mg/dL — ABNORMAL HIGH (ref 70–99)

## 2011-11-04 MED ORDER — FUROSEMIDE 20 MG PO TABS
20.0000 mg | ORAL_TABLET | Freq: Every day | ORAL | Status: DC
  Administered 2011-11-05: 20 mg via ORAL
  Filled 2011-11-04: qty 1

## 2011-11-04 MED ORDER — TECHNETIUM TC 99M TETROFOSMIN IV KIT
30.0000 | PACK | Freq: Once | INTRAVENOUS | Status: AC | PRN
  Administered 2011-11-04: 30 via INTRAVENOUS

## 2011-11-04 MED ORDER — TECHNETIUM TC 99M TETROFOSMIN IV KIT
10.0000 | PACK | Freq: Once | INTRAVENOUS | Status: AC | PRN
  Administered 2011-11-04: 10 via INTRAVENOUS

## 2011-11-04 MED ORDER — REGADENOSON 0.4 MG/5ML IV SOLN
0.4000 mg | Freq: Once | INTRAVENOUS | Status: AC
  Administered 2011-11-04: 0.4 mg via INTRAVENOUS
  Filled 2011-11-04: qty 5

## 2011-11-04 NOTE — Progress Notes (Signed)
Patient Name: Adam Henderson Date of Encounter: 11/04/2011     Principal Problem:  *Chest pain, central Active Problems:  CAD  Colon cancer  Pelvic fluid collection  Ischemic cardiomyopathy  Chronic systolic CHF (congestive heart failure)  Pulmonary nodule, left  LUL Pulm Nodule  SUBJECTIVE  No chest pain or dyspnea  CURRENT MEDS    . aspirin  81 mg Oral Once  . aspirin EC  81 mg Oral Daily  . carvedilol  3.125 mg Oral BID WC  . enoxaparin (LOVENOX) injection  40 mg Subcutaneous Q24H  . ferrous sulfate  325 mg Oral Daily  . furosemide  40 mg Intravenous BID  . pantoprazole  40 mg Oral Daily  . potassium chloride SA  20 mEq Oral BID  . regadenoson  0.4 mg Intravenous Once  . simvastatin  10 mg Oral q1800  . sodium chloride  3 mL Intravenous Q12H  . Tamsulosin HCl  0.4 mg Oral QPC supper  . vitamin B-12  1,000 mcg Oral Daily  . vitamin C  500 mg Oral Daily    OBJECTIVE  Filed Vitals:   11/04/11 0915 11/04/11 1007 11/04/11 1009 11/04/11 1011  BP: 92/55 102/64 102/66 108/64  Pulse: 82 87 93 90  Temp:      TempSrc:      Resp:      Height:      Weight:      SpO2:        Intake/Output Summary (Last 24 hours) at 11/04/11 1157 Last data filed at 11/04/11 0700  Gross per 24 hour  Intake      0 ml  Output   2625 ml  Net  -2625 ml   Filed Weights   11/01/11 2047 11/02/11 0500 11/03/11 0300  Weight: 209 lb (94.802 kg) 215 lb 12.8 oz (97.886 kg) 214 lb (97.07 kg)   PHYSICAL EXAM  General: Pleasant, NAD. Neuro: Alert and oriented X 3. No gross abnormalities Psych: Normal affect. HEENT:  Normal  Neck: Supple  Lungs:  CTA Heart: RRR   No murmurs. Abdomen: Soft, non-tender, non-distended Extremities: No edema  Accessory Clinical Findings  CBC  Basename 11/04/11 0545 11/03/11 0600  WBC 8.8 11.3*  NEUTROABS -- --  HGB 12.2* 11.1*  HCT 38.3* 34.1*  MCV 86.1 86.5  PLT 266 253   Basic Metabolic Panel  Basename 11/04/11 0545 11/02/11 0605  NA 139  138  K 4.0 3.5  CL 99 103  CO2 28 24  GLUCOSE 122* 140*  BUN 11 9  CREATININE 0.75 0.63  CALCIUM 9.2 8.7  MG -- --  PHOS -- --   Liver Function Tests  Basename 11/02/11 0605  AST 11  ALT 10  ALKPHOS 74  BILITOT 0.3  PROT 7.6  ALBUMIN 2.7*   Cardiac Enzymes  Basename 11/02/11 0605 11/02/11 0035 11/01/11 1838  CKTOTAL 72 71 61  CKMB 1.9 1.6 1.4  CKMBINDEX -- -- --  TROPONINI <0.30 <0.30 <0.30   D-Dimer  Basename 11/02/11 0035  DDIMER 1.08*   Hemoglobin A1C  Basename 11/01/11 1838  HGBA1C 6.4*   Thyroid Function Tests  Basename 11/01/11 1838  TSH 0.675  T4TOTAL --  T3FREE --  THYROIDAB --   TELE  Rsr, occas pvc's.   Radiology/Studies  X-ray Chest Pa And Lateral  11/01/2011  *RADIOLOGY REPORT*  Clinical Data: Chest pain.  Chest pain, shortness of breath and fever.  CHEST - 2 VIEW  Comparison: Chest x-ray 11/01/2011.  Findings: A  right internal jugular single lumen power Port-A-Cath is in position with tip terminating in the distal superior vena cava.  Lung volumes are normal. There is cephalization of the pulmonary vasculature and slight indistinctness of the interstitial markings suggestive of mild pulmonary edema.  No focal airspace consolidation.  No pleural effusions.  Mild cardiomegaly is unchanged.  Mediastinal contours are unremarkable.  IMPRESSION: 1.  Appearance of the chest is again most compatible with mild congestive heart failure.  Original Report Authenticated By: Florencia Reasons, M.D.   Ct Angio Chest Pe W/cm &/or Wo Cm  11/02/2011  *RADIOLOGY REPORT*  Clinical Data: Chest pain and pressure, history of colon cancer  CT ANGIOGRAPHY CHEST  Technique:  Multidetector CT imaging of the chest using the standard protocol during bolus administration of intravenous contrast. Multiplanar reconstructed images including MIPs were obtained and reviewed to evaluate the vascular anatomy.  Contrast: OMNIPAQUE IOHEXOL 350 MG/ML SOLN  Comparison: None.   Findings: There is mild, basilar predominant, smooth thickening of the interlobular septa and intralobular interstitia, as well as small bilateral pleural effusions, consistent with mild pulmonary edema.  Moderate cardiomegaly is also present.  There are no filling defects within either pulmonary arterial system to suggest a segmental or subsegmental pulmonary embolus. The thoracic aorta has a normal caliber and appearance.  Coronary artery atherosclerotic calcification is noted.  There is no axillary, mediastinal, or hilar adenopathy.  There is a 6 mm solitary pulmonary nodule in the anterior left upper lobe on image 32 ( If the patient is at high risk for bronchogenic carcinoma, follow-up chest CT at 6-12 months is recommended.  If the patient is at low risk for bronchogenic carcinoma, follow-up chest CT at 12 months is recommended.  This recommendation follows the consensus statement: Guidelines for Management of Small Pulmonary Nodules Detected on CT Scans: A Statement from the Fleischner Society as published in Radiology 2005; 237:395-400. ) The osseous structures and visualized upper abdomen are unremarkable.  IMPRESSION: Cardiomegaly and mild interstitial edema with small pleural effusions.  There is no evidence of pulmonary embolus.  Solitary 6 mm left upper lobe pulmonary nodule.  Please see above recommendations.  Original Report Authenticated By: Brandon Melnick, M.D.   Dg Chest Portable 1 View  11/01/2011  *RADIOLOGY REPORT*  Clinical Data: Chest pain, cough, weakness  PORTABLE CHEST - 1 VIEW  Comparison: Most recent chest x-ray 07/13/2010, CT scan of the abdomen and pelvis including the lung bases 10/09/2011  Findings: Right IJ approach portacatheter remains in unchanged position.  The catheter tip projects over the superior cavoatrial junction.  Progression of enlargement of the cardiac silhouette compared to 07/13/2010.  There is pulmonary vascular congestion, and indistinct pulmonary markings  consistent with interstitial pulmonary edema. No definite pleural effusion.  No acute osseous finding.  IMPRESSION:  1. Cardiomegaly, pulmonary vascular congestion and mild interstitial edema consistent with CHF.  Of note, the degree of cardiomegaly has progressed compared to 07/13/2010.  2.  Right IJ approach portacatheter remains in unchanged and satisfactory position.  Original Report Authenticated By: HEATH   2D Echocardiogram 11/02/2011  Study Conclusions  - Left ventricle: Poor acoustic windows limit study. LVEF is   apprxoimately 30 to 35% with severe hypokinesis of the   inferior, septlal and posterior walls. The cavity size was   severely dilated. - Mitral valve: Mild regurgitation. - Left atrium: The atrium was moderately dilated. _____________  ASSESSMENT AND PLAN  1.  Pleuritic chest pain with chest wall tenderness/CAD:  CTA  yesterday did not show PE.  Await myoview; if no ischemia, DC and FU with me in 2-4 weeks.  2.  Acute on chronic systolic CHF/ICM:  EF by echo 16-10%, slightly improved from previous reading in 2007. Patient euvolemic today; change lasix to home dose. Continue coreg. Add ACEI as outpatient if BP allows  3.  Colon CA:  S/p prior surgery.  Per his report, he has pelvic fluid pending aspiration in [place] (Dr. Uvaldo Bristle).  4.  Lung nodule - repeat CT scan 6 months  5. Leukocytes in urine-no dysuria and no fever; repeat UA with his primary care as outpatient. >30 min PA and physician time D2 Signed, Olga Millers MD

## 2011-11-04 NOTE — Progress Notes (Signed)
Lexiscan Myoview complete. Tolerated well, VSS. Await images. Due to patient complexity, will need to perform full rounding note when patient is back up to floor. Dayna Dunn PA-C

## 2011-11-04 NOTE — Progress Notes (Addendum)
Nuc results reviewed: - IMPRESSION: 1. There is a medium sized area of mild reversibility involving the left apex. 2. Left ventricular ejection fraction equals 16%. 3. Markedly diminished left ventricular systolic function.  Discussed results with patient. Per preliminary discussion with Dr. [name], may need to proceed with cath on Monday to rule out obstructive disease. Adam Henderson was understandably upset about having to stay. I answered all his questions to the best of my ability. We may also need to clarify his overall prognosis given report of metastasis in July 2012. Consider oncology/gen surg eval in AM to discuss prognosis +/- intervention of pelvic fluid collection. [name] is his oncologist and Dr. [name] is his surgeon.   Scotty Weigelt PA-C

## 2011-11-05 LAB — CBC
HCT: 35.3 % — ABNORMAL LOW (ref 39.0–52.0)
Hemoglobin: 11.6 g/dL — ABNORMAL LOW (ref 13.0–17.0)
MCH: 28.1 pg (ref 26.0–34.0)
MCHC: 32.9 g/dL (ref 30.0–36.0)
MCV: 85.5 fL (ref 78.0–100.0)
Platelets: 320 10*3/uL (ref 150–400)
RBC: 4.13 MIL/uL — ABNORMAL LOW (ref 4.22–5.81)
RDW: 14.1 % (ref 11.5–15.5)
WBC: 13.6 10*3/uL — ABNORMAL HIGH (ref 4.0–10.5)

## 2011-11-05 MED ORDER — SODIUM CHLORIDE 0.9 % IJ SOLN: 3.0000 mL | INTRAMUSCULAR | Status: DC | PRN

## 2011-11-05 MED ORDER — ASPIRIN 81 MG PO CHEW: 324.0000 mg | CHEWABLE_TABLET | ORAL | Status: DC

## 2011-11-05 MED ORDER — DIAZEPAM 2 MG PO TABS
2.0000 mg | ORAL_TABLET | ORAL | Status: AC
  Administered 2011-11-06: 2 mg via ORAL
  Filled 2011-11-05: qty 1

## 2011-11-05 MED ORDER — SODIUM CHLORIDE 0.9 % IV SOLN
INTRAVENOUS | Status: DC
  Administered 2011-11-06: 06:00:00 via INTRAVENOUS

## 2011-11-05 MED ORDER — SODIUM CHLORIDE 0.9 % IV SOLN: INTRAVENOUS | Status: DC

## 2011-11-05 MED ORDER — ASPIRIN 81 MG PO CHEW
324.0000 mg | CHEWABLE_TABLET | ORAL | Status: AC
  Administered 2011-11-06: 324 mg via ORAL
  Filled 2011-11-05: qty 4

## 2011-11-05 MED ORDER — SODIUM CHLORIDE 0.9 % IJ SOLN: 3.0000 mL | Freq: Two times a day (BID) | INTRAMUSCULAR | Status: DC

## 2011-11-05 MED ORDER — SODIUM CHLORIDE 0.9 % IV SOLN: 250.0000 mL | INTRAVENOUS | Status: DC | PRN

## 2011-11-05 NOTE — Progress Notes (Signed)
Pharmacist Heart Failure Core Measure Documentation  Assessment: Adam Henderson has an EF documented as 16% on 11/04/2011 by nuclear med study.  Rationale: Heart failure patients with left ventricular systolic dysfunction (LVSD) and an EF < 40% should be prescribed an angiotensin converting enzyme inhibitor (ACEI) or angiotensin receptor blocker (ARB) at discharge unless a contraindication is documented in the medical record.  This patient is not currently on an ACEI or ARB for HF.  This note is being placed in the record in order to provide documentation that a contraindication to the use of these agents is present for this encounter.  ACE Inhibitor or Angiotensin Receptor Blocker is contraindicated (specify all that apply)  []   ACEI allergy AND ARB allergy []   Angioedema []   Moderate or severe aortic stenosis []   Hyperkalemia [x]   Hypotension []   Renal artery stenosis []   Worsening renal function, preexisting renal disease or dysfunction   Mickeal Skinner 11/05/2011 7:26 AM

## 2011-11-05 NOTE — Progress Notes (Signed)
At 0310 pt c/o lt sided chest pressure that worsens with movement and deep inspiration. BP 97/62 hr 84 with sats 98% on RA. O2 applied at 2L [place] and 12 lead EKG done without acute changes noted. Pt given 2mg  of Morphine IV. Will cont to monitor.

## 2011-11-05 NOTE — Progress Notes (Signed)
Patient Name: Adam Henderson Date of Encounter: 11/05/2011     Principal Problem:  *Chest pain, central Active Problems:  CAD  Colon cancer  Pelvic fluid collection  Ischemic cardiomyopathy  Chronic systolic CHF (congestive heart failure)  Pulmonary nodule, left  LUL Pulm Nodule  SUBJECTIVE  Chest pain and cough last PM; no chest pain this AM  CURRENT MEDS    . aspirin  324 mg Oral Pre-Cath  . aspirin EC  81 mg Oral Daily  . carvedilol  3.125 mg Oral BID WC  . enoxaparin (LOVENOX) injection  40 mg Subcutaneous Q24H  . ferrous sulfate  325 mg Oral Daily  . furosemide  20 mg Oral Daily  . pantoprazole  40 mg Oral Daily  . potassium chloride SA  20 mEq Oral BID  . simvastatin  10 mg Oral q1800  . sodium chloride  3 mL Intravenous Q12H  . Tamsulosin HCl  0.4 mg Oral QPC supper  . vitamin B-12  1,000 mcg Oral Daily  . vitamin C  500 mg Oral Daily  . DISCONTD: aspirin  81 mg Oral Once  . DISCONTD: furosemide  40 mg Intravenous BID    OBJECTIVE  Filed Vitals:   11/04/11 1443 11/04/11 2100 11/05/11 0500 11/05/11 0525  BP: 101/66 103/65 98/63 113/71  Pulse: 85 75 83 88  Temp: 98.4 F (36.9 C) 98.3 F (36.8 C) 98.8 F (37.1 C) 98.9 F (37.2 C)  TempSrc: Oral  Oral   Resp: 20 18 20 20   Height: 5\' 9"  (1.753 m)     Weight:   214 lb 11.7 oz (97.4 kg)   SpO2: 97% 98% 98% 98%    Intake/Output Summary (Last 24 hours) at 11/05/11 1125 Last data filed at 11/05/11 0914  Gross per 24 hour  Intake    960 ml  Output    650 ml  Net    310 ml   Filed Weights   11/02/11 0500 11/03/11 0300 11/05/11 0500  Weight: 215 lb 12.8 oz (97.886 kg) 214 lb (97.07 kg) 214 lb 11.7 oz (97.4 kg)   PHYSICAL EXAM  General: Pleasant, NAD. Neuro: Alert and oriented X 3. No gross abnormalities Psych: Normal affect. HEENT:  Normal  Neck: Supple  Lungs:  CTA Heart: RRR   No murmurs. Abdomen: Soft, non-tender, non-distended Extremities: No edema  Accessory Clinical  Findings  CBC  Basename 11/05/11 0628 11/04/11 0545  WBC 13.6* 8.8  NEUTROABS -- --  HGB 11.6* 12.2*  HCT 35.3* 38.3*  MCV 85.5 86.1  PLT 320 266   Basic Metabolic Panel  Basename 11/04/11 0545  NA 139  K 4.0  CL 99  CO2 28  GLUCOSE 122*  BUN 11  CREATININE 0.75  CALCIUM 9.2  MG --  PHOS --     Radiology/Studies  X-ray Chest Pa And Lateral  11/01/2011  *RADIOLOGY REPORT*  Clinical Data: Chest pain.  Chest pain, shortness of breath and fever.  CHEST - 2 VIEW  Comparison: Chest x-ray 11/01/2011.  Findings: A right internal jugular single lumen power Port-A-Cath is in position with tip terminating in the distal superior vena cava.  Lung volumes are normal. There is cephalization of the pulmonary vasculature and slight indistinctness of the interstitial markings suggestive of mild pulmonary edema.  No focal airspace consolidation.  No pleural effusions.  Mild cardiomegaly is unchanged.  Mediastinal contours are unremarkable.  IMPRESSION: 1.  Appearance of the chest is again most compatible with mild congestive heart failure.  Original Report Authenticated By: Florencia Reasons, M.D.   Ct Angio Chest Pe W/cm &/or Wo Cm  11/02/2011  *RADIOLOGY REPORT*  Clinical Data: Chest pain and pressure, history of colon cancer  CT ANGIOGRAPHY CHEST  Technique:  Multidetector CT imaging of the chest using the standard protocol during bolus administration of intravenous contrast. Multiplanar reconstructed images including MIPs were obtained and reviewed to evaluate the vascular anatomy.  Contrast: OMNIPAQUE IOHEXOL 350 MG/ML SOLN  Comparison: None.  Findings: There is mild, basilar predominant, smooth thickening of the interlobular septa and intralobular interstitia, as well as small bilateral pleural effusions, consistent with mild pulmonary edema.  Moderate cardiomegaly is also present.  There are no filling defects within either pulmonary arterial system to suggest a segmental or subsegmental  pulmonary embolus. The thoracic aorta has a normal caliber and appearance.  Coronary artery atherosclerotic calcification is noted.  There is no axillary, mediastinal, or hilar adenopathy.  There is a 6 mm solitary pulmonary nodule in the anterior left upper lobe on image 32 ( If the patient is at high risk for bronchogenic carcinoma, follow-up chest CT at 6-12 months is recommended.  If the patient is at low risk for bronchogenic carcinoma, follow-up chest CT at 12 months is recommended.  This recommendation follows the consensus statement: Guidelines for Management of Small Pulmonary Nodules Detected on CT Scans: A Statement from the Fleischner Society as published in Radiology 2005; 237:395-400. ) The osseous structures and visualized upper abdomen are unremarkable.  IMPRESSION: Cardiomegaly and mild interstitial edema with small pleural effusions.  There is no evidence of pulmonary embolus.  Solitary 6 mm left upper lobe pulmonary nodule.  Please see above recommendations.  Original Report Authenticated By: Brandon Melnick, M.D.   Dg Chest Portable 1 View  11/01/2011  *RADIOLOGY REPORT*  Clinical Data: Chest pain, cough, weakness  PORTABLE CHEST - 1 VIEW  Comparison: Most recent chest x-ray 07/13/2010, CT scan of the abdomen and pelvis including the lung bases 10/09/2011  Findings: Right IJ approach portacatheter remains in unchanged position.  The catheter tip projects over the superior cavoatrial junction.  Progression of enlargement of the cardiac silhouette compared to 07/13/2010.  There is pulmonary vascular congestion, and indistinct pulmonary markings consistent with interstitial pulmonary edema. No definite pleural effusion.  No acute osseous finding.  IMPRESSION:  1. Cardiomegaly, pulmonary vascular congestion and mild interstitial edema consistent with CHF.  Of note, the degree of cardiomegaly has progressed compared to 07/13/2010.  2.  Right IJ approach portacatheter remains in unchanged and  satisfactory position.  Original Report Authenticated By: HEATH   2D Echocardiogram 11/02/2011  Study Conclusions  - Left ventricle: Poor acoustic windows limit study. LVEF is   apprxoimately 30 to 35% with severe hypokinesis of the   inferior, septlal and posterior walls. The cavity size was   severely dilated. - Mitral valve: Mild regurgitation. - Left atrium: The atrium was moderately dilated. _____________  ASSESSMENT AND PLAN  1.  Chest pain:  CTA yesterday did not show PE.  Myoview shows mild apical ischemia and patient with recurrent symptoms. Plan cath in AM; risks and benefits discussed and patient agrees to proceed.  2.  Acute on chronic systolic CHF/ICM:  EF by echo 41-32%, slightly improved from previous reading in 2007. Patient euvolemic today; change lasix to home dose. Continue coreg. Add ACEI as outpatient if BP allows. Hold lasix prior to cath.  3.  Colon CA:  S/p prior surgery.  Per his report, he  has pelvic fluid pending aspiration in [place] (Dr. Uvaldo Bristle).  4.  Lung nodule - repeat CT scan 6 months  5. Leukocytes in urine-repeat UA  Signed, Olga Millers MD

## 2011-11-05 NOTE — Progress Notes (Signed)
At 0525 patient c/o lt sided chest pain radiating into back and also having some sweating. BP 113/71 hr 88. Pain seems to move with patients position changes. Also increases with deep breaths. One SL NTG given with a little relief to lt chest but not back. Pt turned to side and with rubbing area on back pain moved more into lt side. At 0535 Pts BP 99/57 and pt given 650mg  of Tylenol PO. Will cont to monitor.

## 2011-11-06 ENCOUNTER — Encounter (HOSPITAL_COMMUNITY)
Admission: EM | Disposition: A | Discharge status: Home / Self Care | Payer: Self-pay | Source: Home / Self Care | Attending: Cardiology

## 2011-11-06 DIAGNOSIS — I5023 Acute on chronic systolic (congestive) heart failure: Secondary | ICD-10-CM | POA: Diagnosis present

## 2011-11-06 DIAGNOSIS — I2510 Atherosclerotic heart disease of native coronary artery without angina pectoris: Secondary | ICD-10-CM

## 2011-11-06 HISTORY — PX: LEFT HEART CATHETERIZATION WITH CORONARY ANGIOGRAM: SHX5451

## 2011-11-06 LAB — CBC
Platelets: 260 10*3/uL (ref 150–400)
RDW: 14.3 % (ref 11.5–15.5)
WBC: 12.3 10*3/uL — ABNORMAL HIGH (ref 4.0–10.5)

## 2011-11-06 LAB — BASIC METABOLIC PANEL
Calcium: 8.9 mg/dL (ref 8.4–10.5)
GFR calc non Af Amer: >90 mL/min (ref >90)
Glucose, Bld: 156 mg/dL — ABNORMAL HIGH (ref 70–99)
Sodium: 132 mEq/L — ABNORMAL LOW (ref 135–145)

## 2011-11-06 LAB — PROTIME-INR
INR: 1.42 (ref 0.00–1.49)
Prothrombin Time: 17.6 seconds — ABNORMAL HIGH (ref 11.6–15.2)

## 2011-11-06 SURGERY — LEFT HEART CATHETERIZATION WITH CORONARY ANGIOGRAM
Anesthesia: LOCAL

## 2011-11-06 MED ORDER — VERAPAMIL HCL 2.5 MG/ML IV SOLN
INTRAVENOUS | Status: AC
  Filled 2011-11-06: qty 2

## 2011-11-06 MED ORDER — LIDOCAINE HCL (PF) 1 % IJ SOLN
INTRAMUSCULAR | Status: AC
  Filled 2011-11-06: qty 30

## 2011-11-06 MED ORDER — HEPARIN SOD (PORK) LOCK FLUSH 100 UNIT/ML IV SOLN
500.0000 [IU] | INTRAVENOUS | Status: AC | PRN
  Administered 2011-11-06: 19:00:00 500 [IU]

## 2011-11-06 MED ORDER — MIDAZOLAM HCL 2 MG/2ML IJ SOLN
INTRAMUSCULAR | Status: AC
  Filled 2011-11-06: qty 2

## 2011-11-06 MED ORDER — FENTANYL CITRATE 0.05 MG/ML IJ SOLN
INTRAMUSCULAR | Status: AC
  Filled 2011-11-06: qty 2

## 2011-11-06 MED ORDER — NITROGLYCERIN 0.2 MG/ML ON CALL CATH LAB
INTRAVENOUS | Status: AC
  Filled 2011-11-06: qty 1

## 2011-11-06 MED ORDER — SODIUM CHLORIDE 0.9 % IV SOLN
INTRAVENOUS | Status: AC
  Administered 2011-11-06: 16:00:00 via INTRAVENOUS

## 2011-11-06 MED ORDER — CARVEDILOL 3.125 MG PO TABS: 3.1250 mg | ORAL_TABLET | Freq: Two times a day (BID) | ORAL | Status: DC

## 2011-11-06 MED ORDER — HEPARIN SODIUM (PORCINE) 1000 UNIT/ML IJ SOLN
INTRAMUSCULAR | Status: AC
  Filled 2011-11-06: qty 1

## 2011-11-06 MED ORDER — HEPARIN (PORCINE) IN NACL 2-0.9 UNIT/ML-% IJ SOLN
INTRAMUSCULAR | Status: AC
  Filled 2011-11-06: qty 2000

## 2011-11-06 MED FILL — Ferrous Sulfate Tab 325 MG (65 MG Elemental Fe): ORAL | Qty: 1 | Status: AC

## 2011-11-06 NOTE — Discharge Summary (Signed)
Pt is post cath. F/U Dr. [name]. cdm

## 2011-11-06 NOTE — Discharge Summary (Signed)
CARDIOLOGY DISCHARGE SUMMARY   Patient ID: Adam Henderson MRN: [id number] DOB/AGE: 1955-09-24 56 y.o.  Admit date: 11/01/2011 Discharge date: 11/06/2011  Primary Discharge Diagnosis:  Acute on chronic systolic CHF  Secondary Discharge Diagnosis:  Past Medical History  Diagnosis Date    hyperglycemia, hemoglobin A1c 6.4      chest pain  11/01/2011  . CAD (coronary artery disease)     a. s/p MI- CATH 02/15/10-thrombectomy and stent in second obtuse marginal   . Hyperlipidemia   . Blood in urine   . Cancer     a. colon- colonoscopy 7/10 no evidence of recurrent  . Ischemic cardiomyopathy     a. 11/02/2011 Echo: EF 30-35%, sev HK of inf, sept, post walls.  Sev dilated LV, mild MR, mod dil LA.  Marland Kitchen Chronic systolic CHF (congestive heart failure)   . Pulmonary nodule, left     a. CT 11/01/2011 6mm solitary pulmonary nodule ant LUL - f/u CT 12 months (10/2012)   Procedures: Left Heart Catheterization; Selective Coronary Angiography; Left ventricular pressures; 2-D echocardiogram  Hospital Course: Adam Henderson is a 56 year old male with known coronary artery disease and colon cancer with metastases. He had chest pain and shortness of breath. He went to [facility] where he was evaluated and treated with aspirin and nitroglycerin. EMS was called and he was transferred to [facility] for further evaluation and treatment.  Prior to admission, he had received sublingual nitroglycerin and was hypotensive. He was fluid resuscitated but developed volume overload and required diuresis. An echocardiogram was performed and showed left ventricular dysfunction, slightly improved from previous measurements. His blood pressure was low and he tolerated a low dose of a beta blocker but no ACE inhibitor was added because of hypotension. By discharge, his respiratory status was at baseline and his O2 saturation was 98% on room air. His weight at discharge was 212 pounds. He is to follow up in the  office.   His cardiac enzymes remained negative for MI. A d-dimer was elevated so he had a CT angiogram of the chest. This showed a slightly enlarged lung nodule, but no PE. His pain was atypical so a stress test was performed. This was abnormal (results listed below) so he was scheduled for cardiac catheterization on 11/06/2011. The cardiac catheterization results are in detail below, but he had nonobstructive disease and medical therapy was recommended.   A hemoglobin A1c was checked and was slightly elevated at 6.4. He is encouraged to adopt a carbohydrate-modified diet and followup with primary care.  On 11/06/2011, post-cath, Adam Henderson was ambulating without chest pain or shortness of breath and is considered stable for discharge, to followup as an outpatient.  Labs:  Lab Results  Component Value Date   WBC 12.3* 11/06/2011   HGB 11.1* 11/06/2011   HCT 33.8* 11/06/2011   MCV 85.4 11/06/2011   PLT 260 11/06/2011    Lab 11/06/11 0500 11/02/11 0605  NA 132* --  K 3.6 --  CL 95* --  CO2 29 --  BUN 12 --  CREATININE 0.68 --  CALCIUM 8.9 --  PROT -- 7.6  BILITOT -- 0.3  ALKPHOS -- 74  ALT -- 10  AST -- 11  GLUCOSE 156* --   Lab Results  Component Value Date   CKTOTAL 72 11/02/2011   CKMB 1.9 11/02/2011   TROPONINI <0.30 11/02/2011    Pro B Natriuretic peptide (BNP)  Date/Time Value Range Status  11/01/2011  3:32 PM 1658.0*  0 - 125 pg/mL Final  07/14/2010  7:30 AM 70.0  0.0 - 100.0 pg/mL Final    Basename 11/06/11 0500  INR 1.42   Lab Results  Component Value Date   HGBA1C 6.4* 11/01/2011   Radiology: X-ray Chest Pa And Lateral 11/01/2011  *RADIOLOGY REPORT*  Clinical Data: Chest pain.  Chest pain, shortness of breath and fever.  CHEST - 2 VIEW  Comparison: Chest x-ray 11/01/2011.  Findings: A right internal jugular single lumen power Port-A-Cath is in position with tip terminating in the distal superior vena cava.  Lung volumes are normal. There is cephalization of the pulmonary  vasculature and slight indistinctness of the interstitial markings suggestive of mild pulmonary edema.  No focal airspace consolidation.  No pleural effusions.  Mild cardiomegaly is unchanged.  Mediastinal contours are unremarkable.  IMPRESSION: 1.  Appearance of the chest is again most compatible with mild congestive heart failure.  Original Report Authenticated By: Florencia Reasons, M.D.   Ct Angio Chest Pe W/cm &/or Wo Cm 11/02/2011  *RADIOLOGY REPORT*  Clinical Data: Chest pain and pressure, history of colon cancer  CT ANGIOGRAPHY CHEST  Technique:  Multidetector CT imaging of the chest using the standard protocol during bolus administration of intravenous contrast. Multiplanar reconstructed images including MIPs were obtained and reviewed to evaluate the vascular anatomy.  Contrast: OMNIPAQUE IOHEXOL 350 MG/ML SOLN  Comparison: None.  Findings: There is mild, basilar predominant, smooth thickening of the interlobular septa and intralobular interstitia, as well as small bilateral pleural effusions, consistent with mild pulmonary edema.  Moderate cardiomegaly is also present.  There are no filling defects within either pulmonary arterial system to suggest a segmental or subsegmental pulmonary embolus. The thoracic aorta has a normal caliber and appearance.  Coronary artery atherosclerotic calcification is noted.  There is no axillary, mediastinal, or hilar adenopathy.  There is a 6 mm solitary pulmonary nodule in the anterior left upper lobe on image 32 ( If the patient is at high risk for bronchogenic carcinoma, follow-up chest CT at 6-12 months is recommended.  If the patient is at low risk for bronchogenic carcinoma, follow-up chest CT at 12 months is recommended.  This recommendation follows the consensus statement: Guidelines for Management of Small Pulmonary Nodules Detected on CT Scans: A Statement from the Fleischner Society as published in Radiology 2005; 237:395-400. ) The osseous structures  and visualized upper abdomen are unremarkable.  IMPRESSION: Cardiomegaly and mild interstitial edema with small pleural effusions.  There is no evidence of pulmonary embolus.  Solitary 6 mm left upper lobe pulmonary nodule.  Please see above recommendations.  Original Report Authenticated By: Brandon Melnick, M.D.   Nm Myocar Multi W/spect W/wall Motion / Ef 11/04/2011  *RADIOLOGY REPORT*  Clinical Data:  Coronary artery disease  Technique:  Standard myocardial SPECT imaging performed after resting intravenous injection of 10 mCi Tc-55m tetrofosmin. Subsequently, intravenous infusion of Lexiscan performed under the supervision of the Cardiology staff.  At peak effect of the drug, 30 mCi Tc-23mtetrofosmin injected intravenously and standard myocardial SPECT imaging performed.  Quantitative gated imaging also performed to evaluate left ventricular wall motion and estimate left ventricular ejection fraction.  Comparison:  None  MYOCARDIAL IMAGING WITH SPECT (REST AND PHARMACOLOGIC-STRESS)  Findings:  Marked left ventricular dilatation. Left ventricular dilatation diminishes exam detail.  Within the left apex there is a medium sized focus of mild reversibility.  GATED LEFT VENTRICULAR WALL MOTION STUDY  Findings:  Review of the gated images demonstrates markedly diminished  wall motion and thickening.  LEFT VENTRICULAR EJECTION FRACTION  Findings:  QGS ejection fraction measures 16% , with an end- diastolic volume of 298 ml and an end-systolic volume of 252. ml.  IMPRESSION:  1.  There is a medium sized area of mild reversibility involving the left apex. 2.  Left ventricular ejection fraction equals 16%. 3.  Markedly diminished left ventricular systolic function.  These results will be called to the ordering clinician or representative by the Radiologist Assistant, and communication documented in the PACS Dashboard.  Original Report Authenticated By: Rosealee Albee, M.D.   Dg Chest Portable 1 View 11/01/2011   *RADIOLOGY REPORT*  Clinical Data: Chest pain, cough, weakness  PORTABLE CHEST - 1 VIEW  Comparison: Most recent chest x-ray 07/13/2010, CT scan of the abdomen and pelvis including the lung bases 10/09/2011  Findings: Right IJ approach portacatheter remains in unchanged position.  The catheter tip projects over the superior cavoatrial junction.  Progression of enlargement of the cardiac silhouette compared to 07/13/2010.  There is pulmonary vascular congestion, and indistinct pulmonary markings consistent with interstitial pulmonary edema. No definite pleural effusion.  No acute osseous finding.  IMPRESSION:  1. Cardiomegaly, pulmonary vascular congestion and mild interstitial edema consistent with CHF.  Of note, the degree of cardiomegaly has progressed compared to 07/13/2010.  2.  Right IJ approach portacatheter remains in unchanged and satisfactory position.  Original Report Authenticated By: HEATH    Cardiac Cath: 11/06/2011 Left main: No obstructive disease noted.  Left Anterior Descending Artery: Large caliber vessel that courses to the apex. The proximal and mid vessel has mild luminal irregularities. The first diagonal is large in caliber and has mild plaque. The second diagonal is small in caliber and has mild plaque disease.  Circumflex Artery: Large caliber vessel with mild plaque in the proximal vessel. The first OM is small and has a 30% stenosis. The second OM is moderate sized and has a patent stent. There is 20% restenosis within the proximal portion of the stent.  Right Coronary Artery: Large, dominant vessel with mild luminal irregularities.   EKG:05-Nov-2011 03:10:38   Normal sinus rhythm Incomplete left bundle branch block Nonspecific T wave abnormality Abnormal ECG No significant change since last tracing 67mm/s 61mm/mV 100Hz  8.0.1 12SL 241 CID: 1 Referred by: Confirmed By: Lewayne Bunting MD Vent. rate 85 BPM PR interval 148 ms QRS duration 108 ms QT/QTc 378/449 ms P-R-T axes 62  -7 68   Echo: 11/02/2011 Study Conclusions - Left ventricle: Poor acoustic windows limit study. LVEF is apprxoimately 30 to 35% with severe hypokinesis of the inferior, septlal and posterior walls. The cavity size was severely dilated. - Mitral valve: Mild regurgitation. - Left atrium: The atrium was moderately dilated.  FOLLOW UP PLANS AND APPOINTMENTS No Known Allergies Medication List  As of 11/06/2011  5:12 PM   TAKE these medications         aspirin 81 MG tablet   Take 81 mg by mouth daily.      carvedilol 3.125 MG tablet   Commonly known as: COREG   Take 1 tablet (3.125 mg total) by mouth 2 (two) times daily with a meal.      furosemide 20 MG tablet   Commonly known as: LASIX   Take 20 mg by mouth daily.      IRON PO   Take by mouth daily.      pantoprazole 40 MG tablet   Commonly known as: PROTONIX   Take 40 mg by  mouth daily.      potassium chloride SA 20 MEQ tablet   Commonly known as: K-DUR,KLOR-CON   Take by mouth 2 (two) times daily.      pravastatin 20 MG tablet   Commonly known as: PRAVACHOL   Take 20 mg by mouth at bedtime.      prochlorperazine 10 MG tablet   Commonly known as: COMPAZINE   Take 10 mg by mouth every 6 (six) hours as needed.      Tamsulosin HCl 0.4 MG Caps   Commonly known as: FLOMAX      vitamin B-12 1000 MCG tablet   Commonly known as: CYANOCOBALAMIN   Take 1,000 mcg by mouth daily.      vitamin C 500 MG tablet   Commonly known as: ASCORBIC ACID   Take 500 mg by mouth daily.           Discharge Orders    Future Appointments: Provider: Department: Dept Phone: Center:   11/29/2011 9:30 AM Ok Anis, NP [facility] [phone] [id number]     Follow-up Information    Follow up with Nicolasa Ducking, NP. (See for MD on August 28th at 9:30 am.)    Contact information:   [address] [place] [address] [phone]          BRING ALL MEDICATIONS WITH YOU TO FOLLOW UP  APPOINTMENTS  Time spent with patient to include physician time: 42 min Signed: Theodore Demark 11/06/2011, 4:17 PM Co-Sign MD

## 2011-11-06 NOTE — CV Procedure (Signed)
    Cardiac Catheterization Operative Report  Adam Henderson [id number] 8/5/20132:41 PM [name], MD  Procedure Performed:  1. Left Heart Catheterization 2. Selective Coronary Angiography 3. Left ventricular pressures  Operator: Verne Carrow, MD  Arterial access site:  Right radial artery.   Indication:  Chest pain, known CAD, possible ischemia on stress test.                                  Procedure Details: The risks, benefits, complications, treatment options, and expected outcomes were discussed with the patient. The patient and/or family concurred with the proposed plan, giving informed consent. The patient was brought to the cath lab after IV hydration was begun and oral premedication was given. The patient was further sedated with Versed and Fentanyl. The right wrist was assessed with an Allens test which was positive. The right wrist was prepped and draped in a sterile fashion. 1% lidocaine was used for local anesthesia. Using the modified Seldinger access technique, a 5 French sheath was placed in the right radial artery. 3 mg Verapamil was given through the sheath. 4000 units IV heparin was given. Standard diagnostic catheters were used to perform selective coronary angiography. A pigtail catheter was used to measure left ventricular pressures. The sheath was removed from the right radial artery and a Terumo hemostasis band was applied at the arteriotomy site on the right wrist.   There were no immediate complications. The patient was taken to the recovery area in stable condition.   Hemodynamic Findings: Central aortic pressure: 94/61 Left ventricular pressure: 95/13/22  Angiographic Findings:  Left main: No obstructive disease noted.   Left Anterior Descending Artery: Large caliber vessel that courses to the apex. The proximal and mid vessel has mild luminal irregularities. The first diagonal is large in caliber and has mild plaque. The second diagonal is  small in caliber and has mild plaque disease.   Circumflex Artery: Large caliber vessel with mild plaque in the proximal vessel. The first OM is small and has a 30% stenosis. The second OM is moderate sized and has a patent stent. There is 20% restenosis within the proximal portion of the stent.   Right Coronary Artery: Large, dominant vessel with mild luminal irregularities.   Left Ventricular Angiogram: Deferred. (LVEF of 30-35% by echo)  Impression: 1. Single vessel CAD with patent stent in the first OM branch of the Circumflex.  2. Ischemic cardiomyopathy  3. Non-cardiac chest pain.   Recommendations: Continue medical management.        Complications:  None. The patient tolerated the procedure well.

## 2011-11-06 NOTE — H&P (View-Only) (Signed)
 Patient Name: Adam Henderson Date of Encounter: 11/06/2011     Principal Problem:  *Chest pain, central Active Problems:  CAD  Colon cancer  Pelvic fluid collection  Ischemic cardiomyopathy  Chronic systolic CHF (congestive heart failure)  Pulmonary nodule, left  LUL Pulm Nodule  SUBJECTIVE  No chest pain or dyspnea; no dysuria  CURRENT MEDS    . aspirin  324 mg Oral Pre-Cath  . aspirin EC  81 mg Oral Daily  . carvedilol  3.125 mg Oral BID WC  . diazepam  2 mg Oral On Call  . enoxaparin (LOVENOX) injection  40 mg Subcutaneous Q24H  . ferrous sulfate  325 mg Oral Daily  . pantoprazole  40 mg Oral Daily  . simvastatin  10 mg Oral q1800  . sodium chloride  3 mL Intravenous Q12H  . Tamsulosin HCl  0.4 mg Oral QPC supper  . vitamin B-12  1,000 mcg Oral Daily  . vitamin C  500 mg Oral Daily  . DISCONTD: aspirin  324 mg Oral Pre-Cath  . DISCONTD: furosemide  20 mg Oral Daily  . DISCONTD: potassium chloride SA  20 mEq Oral BID  . DISCONTD: sodium chloride  3 mL Intravenous Q12H    OBJECTIVE  Filed Vitals:   11/05/11 0525 11/05/11 2100 11/06/11 0500 11/06/11 0734  BP: 113/71 99/65 93/60 102/62  Pulse: 88 84 75 72  Temp: 98.9 F (37.2 C) 98.6 F (37 C) 98.6 F (37 C)   TempSrc:      Resp: 20 20 18   Height:      Weight:   212 lb 11.9 oz (96.5 kg)   SpO2: 98% 95% 98%     Intake/Output Summary (Last 24 hours) at 11/06/11 0933 Last data filed at 11/06/11 0855  Gross per 24 hour  Intake    120 ml  Output    100 ml  Net     20 ml   Filed Weights   11/03/11 0300 11/05/11 0500 11/06/11 0500  Weight: 214 lb (97.07 kg) 214 lb 11.7 oz (97.4 kg) 212 lb 11.9 oz (96.5 kg)   PHYSICAL EXAM  General: Pleasant, NAD. Neuro: Alert and oriented X 3. No gross abnormalities Psych: Normal affect. HEENT:  Normal  Neck: Supple  Lungs:  CTA Heart: RRR   No murmurs. Abdomen: Soft, non-tender, non-distended Extremities: No edema  Accessory Clinical  Findings  CBC  Basename 11/06/11 0500 11/05/11 0628  WBC 12.3* 13.6*  NEUTROABS -- --  HGB 11.1* 11.6*  HCT 33.8* 35.3*  MCV 85.4 85.5  PLT 260 320   Basic Metabolic Panel  Basename 11/06/11 0500 11/04/11 0545  NA 132* 139  K 3.6 4.0  CL 95* 99  CO2 29 28  GLUCOSE 156* 122*  BUN 12 11  CREATININE 0.68 0.75  CALCIUM 8.9 9.2  MG -- --  PHOS -- --     Radiology/Studies  X-ray Chest Pa And Lateral  11/01/2011  *RADIOLOGY REPORT*  Clinical Data: Chest pain.  Chest pain, shortness of breath and fever.  CHEST - 2 VIEW  Comparison: Chest x-ray 11/01/2011.  Findings: A right internal jugular single lumen power Port-A-Cath is in position with tip terminating in the distal superior vena cava.  Lung volumes are normal. There is cephalization of the pulmonary vasculature and slight indistinctness of the interstitial markings suggestive of mild pulmonary edema.  No focal airspace consolidation.  No pleural effusions.  Mild cardiomegaly is unchanged.  Mediastinal contours are unremarkable.  IMPRESSION:   1.  Appearance of the chest is again most compatible with mild congestive heart failure.  Original Report Authenticated By: DANIEL W. ENTRIKIN, M.D.   Ct Angio Chest Pe W/cm &/or Wo Cm  11/02/2011  *RADIOLOGY REPORT*  Clinical Data: Chest pain and pressure, history of colon cancer  CT ANGIOGRAPHY CHEST  Technique:  Multidetector CT imaging of the chest using the standard protocol during bolus administration of intravenous contrast. Multiplanar reconstructed images including MIPs were obtained and reviewed to evaluate the vascular anatomy.  Contrast: 100mL OMNIPAQUE IOHEXOL 350 MG/ML SOLN  Comparison: None.  Findings: There is mild, basilar predominant, smooth thickening of the interlobular septa and intralobular interstitia, as well as small bilateral pleural effusions, consistent with mild pulmonary edema.  Moderate cardiomegaly is also present.  There are no filling defects within either pulmonary  arterial system to suggest a segmental or subsegmental pulmonary embolus. The thoracic aorta has a normal caliber and appearance.  Coronary artery atherosclerotic calcification is noted.  There is no axillary, mediastinal, or hilar adenopathy.  There is a 6 mm solitary pulmonary nodule in the anterior left upper lobe on image 32 ( If the patient is at high risk for bronchogenic carcinoma, follow-up chest CT at 6-12 months is recommended.  If the patient is at low risk for bronchogenic carcinoma, follow-up chest CT at 12 months is recommended.  This recommendation follows the consensus statement: Guidelines for Management of Small Pulmonary Nodules Detected on CT Scans: A Statement from the Fleischner Society as published in Radiology 2005; 237:395-400. ) The osseous structures and visualized upper abdomen are unremarkable.  IMPRESSION: Cardiomegaly and mild interstitial edema with small pleural effusions.  There is no evidence of pulmonary embolus.  Solitary 6 mm left upper lobe pulmonary nodule.  Please see above recommendations.  Original Report Authenticated By: CARON B. DOVER, M.D.   Dg Chest Portable 1 View  11/01/2011  *RADIOLOGY REPORT*  Clinical Data: Chest pain, cough, weakness  PORTABLE CHEST - 1 VIEW  Comparison: Most recent chest x-ray 07/13/2010, CT scan of the abdomen and pelvis including the lung bases 10/09/2011  Findings: Right IJ approach portacatheter remains in unchanged position.  The catheter tip projects over the superior cavoatrial junction.  Progression of enlargement of the cardiac silhouette compared to 07/13/2010.  There is pulmonary vascular congestion, and indistinct pulmonary markings consistent with interstitial pulmonary edema. No definite pleural effusion.  No acute osseous finding.  IMPRESSION:  1. Cardiomegaly, pulmonary vascular congestion and mild interstitial edema consistent with CHF.  Of note, the degree of cardiomegaly has progressed compared to 07/13/2010.  2.  Right IJ  approach portacatheter remains in unchanged and satisfactory position.  Original Report Authenticated By: HEATH   2D Echocardiogram 11/02/2011  Study Conclusions  - Left ventricle: Poor acoustic windows limit study. LVEF is   apprxoimately 30 to 35% with severe hypokinesis of the   inferior, septlal and posterior walls. The cavity size was   severely dilated. - Mitral valve: Mild regurgitation. - Left atrium: The atrium was moderately dilated. _____________  ASSESSMENT AND PLAN  1.  Chest pain:  CTA yesterday did not show PE.  Myoview shows mild apical ischemia and patient with recurrent symptoms. Plan cath today; risks and benefits discussed and patient agrees to proceed.  2.  Acute on chronic systolic CHF/ICM:  EF by echo 30-35%, slightly improved from previous reading in 2007. Patient euvolemic today; resume lasix 20 mg po daily at DC. Continue coreg. Add ACEI as outpatient if BP allows.     3.  Colon CA:  S/p prior surgery.  Per his report, he has pelvic fluid pending aspiration in [place] (Dr. Darovsy).  4.  Lung nodule - repeat CT scan 6 months  5. Leukocytes in urine-repeat UA pending   Signed, Delanna Blacketer MD    

## 2011-11-06 NOTE — Progress Notes (Signed)
Patient Name: Adam Henderson Date of Encounter: 11/06/2011     Principal Problem:  *Chest pain, central Active Problems:  CAD  Colon cancer  Pelvic fluid collection  Ischemic cardiomyopathy  Chronic systolic CHF (congestive heart failure)  Pulmonary nodule, left  LUL Pulm Nodule  SUBJECTIVE  No chest pain or dyspnea; no dysuria  CURRENT MEDS    . aspirin  324 mg Oral Pre-Cath  . aspirin EC  81 mg Oral Daily  . carvedilol  3.125 mg Oral BID WC  . diazepam  2 mg Oral On Call  . enoxaparin (LOVENOX) injection  40 mg Subcutaneous Q24H  . ferrous sulfate  325 mg Oral Daily  . pantoprazole  40 mg Oral Daily  . simvastatin  10 mg Oral q1800  . sodium chloride  3 mL Intravenous Q12H  . Tamsulosin HCl  0.4 mg Oral QPC supper  . vitamin B-12  1,000 mcg Oral Daily  . vitamin C  500 mg Oral Daily  . DISCONTD: aspirin  324 mg Oral Pre-Cath  . DISCONTD: furosemide  20 mg Oral Daily  . DISCONTD: potassium chloride SA  20 mEq Oral BID  . DISCONTD: sodium chloride  3 mL Intravenous Q12H    OBJECTIVE  Filed Vitals:   11/05/11 0525 11/05/11 2100 11/06/11 0500 11/06/11 0734  BP: 113/71 99/65 93/60  102/62  Pulse: 88 84 75 72  Temp: 98.9 F (37.2 C) 98.6 F (37 C) 98.6 F (37 C)   TempSrc:      Resp: 20 20 18    Height:      Weight:   212 lb 11.9 oz (96.5 kg)   SpO2: 98% 95% 98%     Intake/Output Summary (Last 24 hours) at 11/06/11 0933 Last data filed at 11/06/11 0855  Gross per 24 hour  Intake    120 ml  Output    100 ml  Net     20 ml   Filed Weights   11/03/11 0300 11/05/11 0500 11/06/11 0500  Weight: 214 lb (97.07 kg) 214 lb 11.7 oz (97.4 kg) 212 lb 11.9 oz (96.5 kg)   PHYSICAL EXAM  General: Pleasant, NAD. Neuro: Alert and oriented X 3. No gross abnormalities Psych: Normal affect. HEENT:  Normal  Neck: Supple  Lungs:  CTA Heart: RRR   No murmurs. Abdomen: Soft, non-tender, non-distended Extremities: No edema  Accessory Clinical  Findings  CBC  Basename 11/06/11 0500 11/05/11 0628  WBC 12.3* 13.6*  NEUTROABS -- --  HGB 11.1* 11.6*  HCT 33.8* 35.3*  MCV 85.4 85.5  PLT 260 320   Basic Metabolic Panel  Basename 11/06/11 0500 11/04/11 0545  NA 132* 139  K 3.6 4.0  CL 95* 99  CO2 29 28  GLUCOSE 156* 122*  BUN 12 11  CREATININE 0.68 0.75  CALCIUM 8.9 9.2  MG -- --  PHOS -- --     Radiology/Studies  X-ray Chest Pa And Lateral  11/01/2011  *RADIOLOGY REPORT*  Clinical Data: Chest pain.  Chest pain, shortness of breath and fever.  CHEST - 2 VIEW  Comparison: Chest x-ray 11/01/2011.  Findings: A right internal jugular single lumen power Port-A-Cath is in position with tip terminating in the distal superior vena cava.  Lung volumes are normal. There is cephalization of the pulmonary vasculature and slight indistinctness of the interstitial markings suggestive of mild pulmonary edema.  No focal airspace consolidation.  No pleural effusions.  Mild cardiomegaly is unchanged.  Mediastinal contours are unremarkable.  IMPRESSION:  1.  Appearance of the chest is again most compatible with mild congestive heart failure.  Original Report Authenticated By: Florencia Reasons, M.D.   Ct Angio Chest Pe W/cm &/or Wo Cm  11/02/2011  *RADIOLOGY REPORT*  Clinical Data: Chest pain and pressure, history of colon cancer  CT ANGIOGRAPHY CHEST  Technique:  Multidetector CT imaging of the chest using the standard protocol during bolus administration of intravenous contrast. Multiplanar reconstructed images including MIPs were obtained and reviewed to evaluate the vascular anatomy.  Contrast: OMNIPAQUE IOHEXOL 350 MG/ML SOLN  Comparison: None.  Findings: There is mild, basilar predominant, smooth thickening of the interlobular septa and intralobular interstitia, as well as small bilateral pleural effusions, consistent with mild pulmonary edema.  Moderate cardiomegaly is also present.  There are no filling defects within either pulmonary  arterial system to suggest a segmental or subsegmental pulmonary embolus. The thoracic aorta has a normal caliber and appearance.  Coronary artery atherosclerotic calcification is noted.  There is no axillary, mediastinal, or hilar adenopathy.  There is a 6 mm solitary pulmonary nodule in the anterior left upper lobe on image 32 ( If the patient is at high risk for bronchogenic carcinoma, follow-up chest CT at 6-12 months is recommended.  If the patient is at low risk for bronchogenic carcinoma, follow-up chest CT at 12 months is recommended.  This recommendation follows the consensus statement: Guidelines for Management of Small Pulmonary Nodules Detected on CT Scans: A Statement from the Fleischner Society as published in Radiology 2005; 237:395-400. ) The osseous structures and visualized upper abdomen are unremarkable.  IMPRESSION: Cardiomegaly and mild interstitial edema with small pleural effusions.  There is no evidence of pulmonary embolus.  Solitary 6 mm left upper lobe pulmonary nodule.  Please see above recommendations.  Original Report Authenticated By: Brandon Melnick, M.D.   Dg Chest Portable 1 View  11/01/2011  *RADIOLOGY REPORT*  Clinical Data: Chest pain, cough, weakness  PORTABLE CHEST - 1 VIEW  Comparison: Most recent chest x-ray 07/13/2010, CT scan of the abdomen and pelvis including the lung bases 10/09/2011  Findings: Right IJ approach portacatheter remains in unchanged position.  The catheter tip projects over the superior cavoatrial junction.  Progression of enlargement of the cardiac silhouette compared to 07/13/2010.  There is pulmonary vascular congestion, and indistinct pulmonary markings consistent with interstitial pulmonary edema. No definite pleural effusion.  No acute osseous finding.  IMPRESSION:  1. Cardiomegaly, pulmonary vascular congestion and mild interstitial edema consistent with CHF.  Of note, the degree of cardiomegaly has progressed compared to 07/13/2010.  2.  Right IJ  approach portacatheter remains in unchanged and satisfactory position.  Original Report Authenticated By: HEATH   2D Echocardiogram 11/02/2011  Study Conclusions  - Left ventricle: Poor acoustic windows limit study. LVEF is   apprxoimately 30 to 35% with severe hypokinesis of the   inferior, septlal and posterior walls. The cavity size was   severely dilated. - Mitral valve: Mild regurgitation. - Left atrium: The atrium was moderately dilated. _____________  ASSESSMENT AND PLAN  1.  Chest pain:  CTA yesterday did not show PE.  Myoview shows mild apical ischemia and patient with recurrent symptoms. Plan cath today; risks and benefits discussed and patient agrees to proceed.  2.  Acute on chronic systolic CHF/ICM:  EF by echo 40-98%, slightly improved from previous reading in 2007. Patient euvolemic today; resume lasix 20 mg po daily at DC. Continue coreg. Add ACEI as outpatient if BP allows.  3.  Colon CA:  S/p prior surgery.  Per his report, he has pelvic fluid pending aspiration in [place] (Dr. Uvaldo Bristle).  4.  Lung nodule - repeat CT scan 6 months  5. Leukocytes in urine-repeat UA pending   Signed, Olga Millers MD

## 2011-11-06 NOTE — Plan of Care (Signed)
Problem: Phase II Progression Outcomes Goal: Barriers To Progression Addressed/Resolved Outcome: Completed/Met Date Met:  11/06/11 See chest pain, R/O MI clinical path for outcomes  Comments:  Pt and wife given discharge instructions via teach back method and both verbalize understanding of instructions. Pt had no complaints of pain and vital signs within normal limits.  Pt escorted out of building via wheelchair to private vehicle.

## 2011-11-06 NOTE — Interval H&P Note (Signed)
History and Physical Interval Note:  11/06/2011 2:05 PM  Adam Henderson  has presented today for surgery, with the diagnosis of chest pain  The various methods of treatment have been discussed with the patient and family. After consideration of risks, benefits and other options for treatment, the patient has consented to  Procedure(s) (LRB): LEFT HEART CATHETERIZATION WITH CORONARY ANGIOGRAM (N/A) as a surgical intervention .  The patient's history has been reviewed, patient examined, no change in status, stable for surgery.  I have reviewed the patient's chart and labs.  Questions were answered to the patient's satisfaction.     Kavir Savoca

## 2011-11-06 NOTE — Progress Notes (Signed)
TR BAND REMOVAL  LOCATION:    right radial  DEFLATED PER PROTOCOL:    yes  TIME BAND OFF / DRESSING APPLIED:    1730  SITE UPON ARRIVAL:    Level 0  SITE AFTER BAND REMOVAL:    Level 0  REVERSE ALLEN'S TEST:     positive  CIRCULATION SENSATION AND MOVEMENT:    Within Normal Limits   yes  COMMENTS:   TOLERATED PROCEDURE WELL

## 2011-11-16 ENCOUNTER — Ambulatory Visit (INDEPENDENT_AMBULATORY_CARE_PROVIDER_SITE_OTHER): Payer: BC Managed Care – PPO | Admitting: Physician Assistant

## 2011-11-16 ENCOUNTER — Encounter: Payer: Self-pay | Admitting: Physician Assistant

## 2011-11-16 VITALS — BP 86/54 | HR 79 | Ht 70.0 in | Wt 212.0 lb

## 2011-11-16 DIAGNOSIS — I428 Other cardiomyopathies: Secondary | ICD-10-CM

## 2011-11-16 DIAGNOSIS — I2510 Atherosclerotic heart disease of native coronary artery without angina pectoris: Secondary | ICD-10-CM

## 2011-11-16 DIAGNOSIS — I5022 Chronic systolic (congestive) heart failure: Secondary | ICD-10-CM

## 2011-11-16 DIAGNOSIS — R911 Solitary pulmonary nodule: Secondary | ICD-10-CM

## 2011-11-16 DIAGNOSIS — R0602 Shortness of breath: Secondary | ICD-10-CM

## 2011-11-16 DIAGNOSIS — C801 Malignant (primary) neoplasm, unspecified: Secondary | ICD-10-CM

## 2011-11-16 DIAGNOSIS — I959 Hypotension, unspecified: Secondary | ICD-10-CM

## 2011-11-16 LAB — BASIC METABOLIC PANEL
BUN: 8 mg/dL (ref 6–23)
CO2: 31 mEq/L (ref 19–32)
Calcium: 8.7 mg/dL (ref 8.4–10.5)
Creatinine, Ser: 0.6 mg/dL (ref 0.4–1.5)

## 2011-11-16 MED ORDER — FUROSEMIDE 20 MG PO TABS: 20.0000 mg | ORAL_TABLET | Freq: Every day | ORAL | Status: DC

## 2011-11-16 NOTE — Patient Instructions (Addendum)
DO NOT TAKE COREG TONIGHT 11/16/11  TAKE COREG TOMORROW 8/16 NIGHT BEFORE BED; THEN STOP   Your physician recommends that you return for lab work in: TODAY BMET, BNP   KEEP APPT WITH CHRIS BERG 11/29/11  DECREASE LASIX TO 20 MG DAILY

## 2011-11-16 NOTE — Progress Notes (Signed)
[address] [place], [place]  [address] Phone: [phone] Fax:  [phone]  Date:  11/16/2011   Name:  Adam Henderson   DOB:  June 25, 1955   MRN:  [id number]  PCP:  [name], MD  Primary Cardiologist:  Dr. Olga Millers  Primary Electrophysiologist:  None    History of Present Illness: Adam Henderson is a 56 y.o. male who returns for wrist pain.  He has a history of CAD, non-ischemic cardiomyopathy (cardiomyopathy out of proportion to CAD), systolic CHF, HL, metastatic rectal carcinoma. Last seen by Dr. [name] 2/12. He underwent thrombectomy and BMS to the OM2 11/11. He was admitted 7/31-8/5 with chest pain. He developed hypotension with nitroglycerin. He was resuscitated with IV fluids and developed volume overload requiring diuresis. He ruled out for myocardial infarction. D-dimer was elevated. Chest CT 11/02/11: Mild interstitial edema with small pleural effusions, no pulmonary embolus, solitary 6 mm left upper lobe nodule. Myoview 11/04/11: Medium sized area of mild reversibility involving left apex, EF 16%. Echo 11/02/11: EF 30-35%, severe HK at the inferior, septal and posterior walls, mild MR, moderate LAE. LHC via right radial artery 11/06/11: OM1 30%, OM2 patent stent with 20% ISR proximal, RCA luminal irregularities. Chest discomfort was felt to be noncardiac.  He has not felt well. He describes pain in his wrist as well as his legs and his back. He feels fatigued. He denies syncope. He has some vague chest discomfort. He denies orthopnea or PND. He has some LE edema. His weight had gone up 4 pounds and he increased his Lasix to 20 mg twice a day. This is improved.  Wt Readings from Last 3 Encounters:  11/16/11 212 lb (96.163 kg)  11/06/11 212 lb 11.9 oz (96.5 kg)  11/06/11 212 lb 11.9 oz (96.5 kg)     Past Medical History  Diagnosis Date  . CAD (coronary artery disease)     a. s/p MI- CATH 02/15/10-thrombectomy and stent in second obtuse marginal   .  Hyperlipidemia   . Blood in urine   . Cancer     a. colon- colonoscopy 7/10 no evidence of recurrent  . Ischemic cardiomyopathy     a. 11/02/2011 Echo: EF 30-35%, sev HK of inf, sept, post walls.  Sev dilated LV, mild MR, mod dil LA.  Marland Kitchen Chronic systolic CHF (congestive heart failure)   . Pulmonary nodule, left     a. CT 11/01/2011 6mm solitary pulmonary nodule ant LUL - f/u CT 12 months (10/2012)    Current Outpatient Prescriptions  Medication Sig Dispense Refill  . aspirin 81 MG tablet Take 81 mg by mouth daily.        . carvedilol (COREG) 3.125 MG tablet Take 1 tablet (3.125 mg total) by mouth 2 (two) times daily with a meal.  60 tablet  11  . furosemide (LASIX) 20 MG tablet Take 20 mg by mouth 2 (two) times daily.       . IRON PO Take by mouth daily.        . pantoprazole (PROTONIX) 40 MG tablet Take 40 mg by mouth daily.        . potassium chloride SA (K-DUR,KLOR-CON) 20 MEQ tablet Take 20 mEq by mouth daily.       . pravastatin (PRAVACHOL) 20 MG tablet Take 20 mg by mouth at bedtime.        . prochlorperazine (COMPAZINE) 10 MG tablet Take 10 mg by mouth every 6 (six) hours as needed.        Marland Kitchen  Tamsulosin HCl (FLOMAX) 0.4 MG CAPS       . vitamin B-12 (CYANOCOBALAMIN) 1000 MCG tablet Take 1,000 mcg by mouth daily.        . vitamin C (ASCORBIC ACID) 500 MG tablet Take 500 mg by mouth daily.        Allergies: No Known Allergies  History  Substance Use Topics  . Smoking status: Never Smoker   . Smokeless tobacco: Never Used  . Alcohol Use: No     ROS:  Please see the history of present illness.   He has had some hematuria. This is followed by urology. He also notes night sweats. He was recently found to have an enlarging collection of pelvic fluid that is to be drained at some point in the near future.  All other systems reviewed and negative.   PHYSICAL EXAM: VS:  BP 86/54  Pulse 79  Ht 5\' 10"  (1.778 m)  Wt 212 lb (96.163 kg)  BMI 30.42 kg/m2 Well nourished, well developed, in  no acute distress HEENT: normal Neck: no JVD Cardiac:  normal S1, S2; RRR; no murmur Lungs:  clear to auscultation bilaterally, no wheezing, rhonchi or rales Abd: soft, nontender, no hepatomegaly Ext: no edema; right wrist without hematoma or bruit  Skin: warm and dry Neuro:  CNs 2-12 intact, no focal abnormalities noted  EKG:  Sinus rhythm, heart rate 79, normal axis, intraventricular conduction delay, inferior T wave inversions, PVCs      ASSESSMENT AND PLAN:  1. Hypotension This likely explains his symptoms. Coreg was added to his medical regimen in the hospital. He has also recently increased his Lasix. I have asked him to discontinue carvedilol. He will also reduce his Lasix to once a day. Check a basic metabolic panel and BNP today. He already has followup planned for 11/29/11. He should keep that appointment.  2. Chronic Systolic CHF Discontinue carvedilol as noted. Consider adding low-dose Toprol at followup. Obtain basic metabolic panel and BNP today. Reduce Lasix as noted.  3. Coronary Artery Disease Stable by recent cath. Chest pain is noncardiac. Continue aspirin and statin.  4. Metastatic Rectal Carcinoma Continue followup with oncology in [place].  5. Lung Nodule Given his history of metastatic cancer, I have explained to him the importance of followup with his oncologist. He already has an appointment next week.  6. Nonischemic Cardiomyopathy Titrate medications as his blood pressure will allow. Consider followup echocardiogram in 3 months.  SignedTereso Newcomer, PA-C  11:00 AM 11/16/2011

## 2011-11-17 ENCOUNTER — Telehealth: Payer: Self-pay | Admitting: *Deleted

## 2011-11-17 NOTE — Telephone Encounter (Signed)
Message copied by Tarri Fuller on Fri Nov 17, 2011 12:46 PM ------      Message from: [place], [place] T      Created: Thu Nov 16, 2011  5:40 PM       [facility]      Continue with current treatment plan.      Tereso Newcomer, PA-C  5:39 PM 11/16/2011

## 2011-11-17 NOTE — Telephone Encounter (Signed)
lmom lab ok 

## 2011-11-20 ENCOUNTER — Telehealth (INDEPENDENT_AMBULATORY_CARE_PROVIDER_SITE_OTHER): Payer: Self-pay | Admitting: General Surgery

## 2011-11-20 NOTE — Telephone Encounter (Signed)
Patient called in stating he would like to move up his appointment that has been set to see Dr. [name]. He stated it is getting bad and he wants to be seen sooner. Current appointment set for 12/05/11. Advised patient his call will be returned. Confirmed called back number as [phone].  Will check with Dr. [name] and find spot to move the patient based on his current schedule.

## 2011-11-21 ENCOUNTER — Encounter: Payer: BC Managed Care – PPO | Admitting: Internal Medicine

## 2011-11-21 ENCOUNTER — Telehealth (INDEPENDENT_AMBULATORY_CARE_PROVIDER_SITE_OTHER): Payer: Self-pay | Admitting: General Surgery

## 2011-11-21 DIAGNOSIS — I509 Heart failure, unspecified: Secondary | ICD-10-CM

## 2011-11-21 DIAGNOSIS — C20 Malignant neoplasm of rectum: Secondary | ICD-10-CM

## 2011-11-21 NOTE — Telephone Encounter (Signed)
Called patient back based on conversation yesterday. Patient stated the pocket of fluid has increased in size and painful. He said his oncologist told him to see Dr. [name]. Patient stated he is going to see Dr. Derald Macleod today for CT and that he would bring the disc for [facility] to see. I advised him that the report from the test can be sent to [place] and to hold onto the disc until the appointment. Advised patient call will be returned this afternoon once I have had an opportunity to call other doctors involved to obtain the information Dr. [name] needs. Patient agreed.

## 2011-11-29 ENCOUNTER — Ambulatory Visit (INDEPENDENT_AMBULATORY_CARE_PROVIDER_SITE_OTHER): Payer: BC Managed Care – PPO | Admitting: General Surgery

## 2011-11-29 ENCOUNTER — Ambulatory Visit (INDEPENDENT_AMBULATORY_CARE_PROVIDER_SITE_OTHER)
Admission: RE | Admit: 2011-11-29 | Discharge: 2011-11-29 | Disposition: A | Discharge status: Home / Self Care | Payer: BC Managed Care – PPO | Source: Ambulatory Visit | Attending: Nurse Practitioner | Admitting: Nurse Practitioner

## 2011-11-29 ENCOUNTER — Ambulatory Visit (INDEPENDENT_AMBULATORY_CARE_PROVIDER_SITE_OTHER): Payer: BC Managed Care – PPO | Admitting: Nurse Practitioner

## 2011-11-29 ENCOUNTER — Encounter (INDEPENDENT_AMBULATORY_CARE_PROVIDER_SITE_OTHER): Payer: Self-pay | Admitting: General Surgery

## 2011-11-29 ENCOUNTER — Other Ambulatory Visit (INDEPENDENT_AMBULATORY_CARE_PROVIDER_SITE_OTHER): Payer: Self-pay | Admitting: General Surgery

## 2011-11-29 ENCOUNTER — Encounter: Payer: Self-pay | Admitting: Nurse Practitioner

## 2011-11-29 ENCOUNTER — Other Ambulatory Visit: Payer: Self-pay | Admitting: *Deleted

## 2011-11-29 VITALS — BP 128/76 | HR 95 | Temp 99.2°F | Ht 70.0 in | Wt 206.0 lb

## 2011-11-29 VITALS — BP 102/74 | HR 111 | Ht 70.0 in | Wt 203.4 lb

## 2011-11-29 DIAGNOSIS — I2510 Atherosclerotic heart disease of native coronary artery without angina pectoris: Secondary | ICD-10-CM

## 2011-11-29 DIAGNOSIS — R071 Chest pain on breathing: Secondary | ICD-10-CM

## 2011-11-29 DIAGNOSIS — I255 Ischemic cardiomyopathy: Secondary | ICD-10-CM

## 2011-11-29 DIAGNOSIS — R0781 Pleurodynia: Secondary | ICD-10-CM

## 2011-11-29 DIAGNOSIS — R05 Cough: Secondary | ICD-10-CM

## 2011-11-29 DIAGNOSIS — I509 Heart failure, unspecified: Secondary | ICD-10-CM

## 2011-11-29 DIAGNOSIS — I2589 Other forms of chronic ischemic heart disease: Secondary | ICD-10-CM

## 2011-11-29 DIAGNOSIS — C189 Malignant neoplasm of colon, unspecified: Secondary | ICD-10-CM

## 2011-11-29 DIAGNOSIS — R188 Other ascites: Secondary | ICD-10-CM

## 2011-11-29 LAB — CBC WITH DIFFERENTIAL/PLATELET
Basophils Absolute: 0.1 10*3/uL (ref 0.0–0.1)
Eosinophils Absolute: 0.1 10*3/uL (ref 0.0–0.7)
Hemoglobin: 12.3 g/dL — ABNORMAL LOW (ref 13.0–17.0)
Lymphocytes Relative: 7.2 % — ABNORMAL LOW (ref 12.0–46.0)
MCHC: 32.5 g/dL (ref 30.0–36.0)
Monocytes Relative: 8 % (ref 3.0–12.0)
Neutrophils Relative %: 83.2 % — ABNORMAL HIGH (ref 43.0–77.0)
Platelets: 448 10*3/uL — ABNORMAL HIGH (ref 150.0–400.0)
RDW: 14.5 % (ref 11.5–14.6)

## 2011-11-29 LAB — BASIC METABOLIC PANEL
BUN: 7 mg/dL (ref 6–23)
CO2: 30 mEq/L (ref 19–32)
Calcium: 8.9 mg/dL (ref 8.4–10.5)
Creatinine, Ser: 0.8 mg/dL (ref 0.4–1.5)
GFR: 111.86 mL/min (ref >60.00)
Glucose, Bld: 140 mg/dL — ABNORMAL HIGH (ref 70–99)
Sodium: 134 mEq/L — ABNORMAL LOW (ref 135–145)

## 2011-11-29 MED ORDER — BENZONATATE 100 MG PO CAPS: 100.0000 mg | ORAL_CAPSULE | Freq: Three times a day (TID) | ORAL | Status: DC | PRN

## 2011-11-29 MED ORDER — MOXIFLOXACIN HCL 400 MG PO TABS: 400.0000 mg | ORAL_TABLET | Freq: Every day | ORAL | Status: DC

## 2011-11-29 NOTE — Progress Notes (Signed)
Patient Name: Adam Henderson Date of Encounter: 11/29/2011  Primary Care Provider:  [name], MD Primary Cardiologist:  B. [name], MD  Patient Profile  56 year old male with history of CAD and ischemic cardiomyopathy who presents for followup.  Problem List   Past Medical History  Diagnosis Date  . CAD (coronary artery disease)     a. s/p MI/Cath 02/15/10: thrombectomy and stent in second obtuse marginal ;  b. 10/2011 Cath: patent OM stent, otw nonobs dzs -> med Rx.  Marland Kitchen Hyperlipidemia   . Blood in urine   . Cancer     a. colon- colonoscopy 7/10 no evidence of recurrent  . Ischemic cardiomyopathy     a. 11/02/2011 Echo: EF 30-35%, sev HK of inf, sept, post walls.  Sev dilated LV, mild MR, mod dil LA.  Marland Kitchen Chronic systolic CHF (congestive heart failure)   . Pulmonary nodule, left     a. CT 11/01/2011 6mm solitary pulmonary nodule ant LUL - f/u CT 6 months (04/2012)  . Cough     a. with pleuritic chest pain 11/2011   Past Surgical History  Procedure Date  . Proctoscopy   . Ureteral repair   . Cardiac stents   . Colon surgery     abdomeno perineal resection  . Colostomy   . Portacath placement     Allergies  No Known Allergies  HPI  56 y/o male with the above problem list.  He was hospitalized in July for sscp and chf.  CT of the chest @ that time showed no evidence of PE (ddimer was elevated).  Cath showed patent stent and otw nonobs dzs.  He was diuresed and meds were adjusted.  When he followed up earlier this month, he reported general malaise and was hypotensive.  His coreg was d/c'd and lasix dose reduced.  Since then, he has continued to note a reduction in exercise tolerance.  He says that he simply has no "get up and go."  He does experience DOE but usually doesn't exert himself enough to get to that point.  His weight is down 9 lbs since his last visit.  His appetite has been fair.  He denies pnd, orthopnea, n, v, dizziness, syncope, edema, weight gain, or early  satiety.  He has also been bothered by a dry cough.  It comes on intermittently and has never been productive.  Halls has helped some.  In the setting of this cough, he has developed pleuritic chest pain along his lower rib margins bilat.  This is only present during spells of coughing.  Home Medications  Prior to Admission medications   Medication Sig Start Date End Date Taking? Authorizing Provider  aspirin 81 MG tablet Take 81 mg by mouth daily.     Yes Historical Provider, MD  furosemide (LASIX) 20 MG tablet Take 1 tablet (20 mg total) by mouth daily. 11/16/11  Yes Beatrice Lecher, PA  HYDROcodone-acetaminophen (NORCO/VICODIN) 5-325 MG per tablet Take 1 tablet by mouth every 4 (four) hours as needed.  11/22/11  Yes Historical Provider, MD  IRON PO Take by mouth daily.     Yes Historical Provider, MD  pantoprazole (PROTONIX) 40 MG tablet Take 40 mg by mouth daily.     Yes Historical Provider, MD  potassium chloride SA (K-DUR,KLOR-CON) 20 MEQ tablet Take 20 mEq by mouth daily.    Yes Historical Provider, MD  pravastatin (PRAVACHOL) 20 MG tablet Take 20 mg by mouth at bedtime.  Yes Historical Provider, MD  prochlorperazine (COMPAZINE) 10 MG tablet Take 10 mg by mouth every 6 (six) hours as needed.     Yes Historical Provider, MD  Tamsulosin HCl (FLOMAX) 0.4 MG CAPS Take 0.4 mg by mouth daily.  08/11/11  Yes Historical Provider, MD  vitamin B-12 (CYANOCOBALAMIN) 1000 MCG tablet Take 1,000 mcg by mouth daily.     Yes Historical Provider, MD  vitamin C (ASCORBIC ACID) 500 MG tablet Take 500 mg by mouth daily.   Yes Historical Provider, MD  benzonatate (TESSALON) 100 MG capsule Take 1 capsule (100 mg total) by mouth 3 (three) times daily as needed for cough. 11/29/11 12/06/11  Ok Anis, NP    Review of Systems  As outlined above, patient has had general malaise and fatigue with reduced exercise tolerance.  He has had some dyspnea on exertion.  He has had intermittent dry, nagging cough  with associated pleuritic chest pain.  He does not have chest pain in the absence of an active cough.  He denies PND, orthopnea, dizziness, syncope, edema, or early satiety.  His appetite has been fair.  All other systems reviewed and are otherwise negative except as noted above.  Physical Exam  Blood pressure 102/74, pulse 111, height 5\' 10"  (1.778 m), weight 203 lb 6.4 oz (92.262 kg).  Weight is down from 212 on his last visit.  SpO2 99% RA. General: Pleasant, NAD Psych: Normal affect. Neuro: Alert and oriented X 3. Moves all extremities spontaneously. HEENT: Normal  Neck: Supple without bruits or JVD (neck veins are flat). Lungs:  Resp regular and unlabored, CTA though very slightly diminished on left. Heart: RRR, tachy.  No s3, s4, or murmurs. Abdomen: Soft, non-tender, non-distended, BS + x 4.  Extremities: No clubbing, cyanosis or edema. DP/PT/Radials 2+ and equal bilaterally.  Accessory Clinical Findings  ECG - sinus tach, , lateral twi (old).  No acute s/t changes.  Assessment & Plan  1.  Pleuritic chest pain/Dry cough:  This is pts biggest complaint today.  This is new since his last visit.  I reviewed his meds and he is not on an ACEI.  He denies fever/chills.  On exam, breath sounds on the left are very slightly diminished.  Will check a cxr and cbc today.  I've given him a Rx for tessalon and advised that he f/u with his PCP w/in the next week.    2.  Chronic systolic chf/Mixed ICM/NICM:  Pt reports general malaise, reduced exercise tolerance, and some DOE.  His weight is down 9 lbs.  He is tachycardic.  I will check a bmet today.  I'm concerned that he may be dehydrated and have asked him to hold his lasix and potassium until further notice.  His volume status looks quite good, though low output may be contributing to his tachycardia.  He is not on bb/acei/arb secondary to relative hypotn.  3.  Sinus Tachycardia:  See above.  He had an elevated ddimer while hosp with negative CT  for PE.  His RA O2 sat is 99%.  Though he has had pleuritic chest pain, this is only in the setting of his dry cough.  4.  CAD:  S/p nonobs cath.  Cont med Rx as tolerated.  ASA/Statin.  5.  Dispo:  Labs and xray as above.  F/U pcp in 1 week.  We will determine cardiology f/u after lab results.    Nicolasa Ducking, NP 11/29/2011, 11:04 AM

## 2011-11-29 NOTE — Progress Notes (Signed)
Patient ID: Adam Henderson, male   DOB: [birth year], 56 y.o.   MRN: [id number]  Chief Complaint  Patient presents with  . Follow-up    rectal ck    HPI Adam Henderson is a 56 y.o. male.  He is referred back to me by [name] for evaluation and management of a chronic presacral pelvic fluid collection.  The patient has a significant history of advanced rectal cancer. He presented to [name] with a large rectal cancer and underwent neoadjuvant chemotherapy and radiation therapy. On 07/13/2010 he then underwent abdominoperineal resection and repair of his urethra. He had a stage IIIB cancer, 13 of 15 minutes positive. He recovered from that surgery. He underwent adjuvant chemotherapy with oxaliplatin and Xeloda which was completed 01/19/2011  He's had intermittent problems with gross hematuria and has been followed by Dr. Brunilda Payor.  Cystoscopy on 07/05/2011 showed no gross abnormalities.  Then starting in June of this year he developed some presacral pressure-like symptoms. CEA level rose to 27.8 on July 8 and a CT scan on July 8 showed a presacral fluid and gas collection of uncertain etiology. He was not toxic or ill at that time. He saw [name]  again on 10/31/2011 who found that he had a urinary tract infection but no other abnormalities.  Colonoscopy was in the planning stages but was never done.  Plans were being made for percutaneous drainage of this fluid collection but he had to be acutely hospitalized from July 31 of August 5 by Dr. Olga Millers for acute on chronic systolic congestive heart failure. He had atypical chest pain. Enzymes were negative. CT angios chest was negative. Cardiac cath showed nonobstructive disease. He was tuned up and was ambulatory and asymptomatic at the time of discharge.  He continues to have presacral pressure-like symptoms. His appetite is fairly good. His colostomy is working well. He doesn't have abdominal pain. Denies fever or chills.  I have  discussed his care with Dr. Hilliard Clark.    And we have decided that he needs to have this area drained and probably be observed in the hospital after that is done. I discussed this with the patient and his family and they agree. HPI  Past Medical History  Diagnosis Date  . CAD (coronary artery disease)     a. s/p MI/Cath 02/15/10: thrombectomy and stent in second obtuse marginal ;  b. 10/2011 Cath: patent OM stent, otw nonobs dzs -> med Rx.  Marland Kitchen Hyperlipidemia   . Blood in urine   . Cancer     a. colon- colonoscopy 7/10 no evidence of recurrent  . Ischemic cardiomyopathy     a. 11/02/2011 Echo: EF 30-35%, sev HK of inf, sept, post walls.  Sev dilated LV, mild MR, mod dil LA.  Marland Kitchen Chronic systolic CHF (congestive heart failure)   . Pulmonary nodule, left     a. CT 11/01/2011 6mm solitary pulmonary nodule ant LUL - f/u CT 6 months (04/2012)  . Cough     a. with pleuritic chest pain 11/2011    Past Surgical History  Procedure Date  . Proctoscopy   . Ureteral repair   . Cardiac stents   . Colon surgery     abdomeno perineal resection  . Colostomy   . Portacath placement     Family History  Problem Relation Age of Onset  . Cancer Mother     breast  . Diabetes Father   . Colon cancer Neg Hx  Social History History  Substance Use Topics  . Smoking status: Never Smoker   . Smokeless tobacco: Never Used  . Alcohol Use: No    No Known Allergies  Current Outpatient Prescriptions  Medication Sig Dispense Refill  . aspirin 81 MG tablet Take 81 mg by mouth daily.        . benzonatate (TESSALON) 100 MG capsule Take 1 capsule (100 mg total) by mouth 3 (three) times daily as needed for cough.  20 capsule  0  . HYDROcodone-acetaminophen (NORCO/VICODIN) 5-325 MG per tablet Take 1 tablet by mouth every 4 (four) hours as needed.       . IRON PO Take by mouth daily.        . pantoprazole (PROTONIX) 40 MG tablet Take 40 mg by mouth daily.        . pravastatin (PRAVACHOL) 20 MG tablet Take 20  mg by mouth at bedtime.        . prochlorperazine (COMPAZINE) 10 MG tablet Take 10 mg by mouth every 6 (six) hours as needed.        . Tamsulosin HCl (FLOMAX) 0.4 MG CAPS Take 0.4 mg by mouth daily.       . vitamin B-12 (CYANOCOBALAMIN) 1000 MCG tablet Take 1,000 mcg by mouth daily.        . vitamin C (ASCORBIC ACID) 500 MG tablet Take 500 mg by mouth daily.      . furosemide (LASIX) 20 MG tablet Take 1 tablet (20 mg total) by mouth daily.      . potassium chloride SA (K-DUR,KLOR-CON) 20 MEQ tablet Take 20 mEq by mouth daily.         Review of Systems Review of Systems  Constitutional: Positive for fatigue and unexpected weight change. Negative for fever and chills.  HENT: Negative for hearing loss, congestion, sore throat, trouble swallowing and voice change.   Eyes: Negative for visual disturbance.  Respiratory: Positive for chest tightness. Negative for cough and wheezing.   Cardiovascular: Negative for chest pain, palpitations and leg swelling.  Gastrointestinal: Negative for nausea, vomiting, abdominal pain, diarrhea, constipation, blood in stool, abdominal distention, anal bleeding and rectal pain.  Genitourinary: Positive for hematuria and difficulty urinating.  Musculoskeletal: Negative for arthralgias.  Skin: Negative for rash and wound.  Neurological: Negative for seizures, syncope, weakness and headaches.  Hematological: Negative for adenopathy. Does not bruise/bleed easily.  Psychiatric/Behavioral: Negative for confusion.    Blood pressure 128/76, pulse 95, temperature 99.2 F (37.3 C), temperature source Temporal, height 5\' 10"  (1.778 m), weight 206 lb (93.441 kg), SpO2 97.00%.  Physical Exam Physical Exam  Constitutional: He is oriented to person, place, and time. He appears well-developed and well-nourished. No distress.  HENT:  Head: Normocephalic.  Nose: Nose normal.  Mouth/Throat: No oropharyngeal exudate.  Eyes: Conjunctivae and EOM are normal. Pupils are equal,  round, and reactive to light. Right eye exhibits no discharge. Left eye exhibits no discharge. No scleral icterus.  Neck: Normal range of motion. Neck supple. No JVD present. No tracheal deviation present. No thyromegaly present.  Cardiovascular: Normal rate, regular rhythm, normal heart sounds and intact distal pulses.   No murmur heard. Pulmonary/Chest: Effort normal and breath sounds normal. No stridor. No respiratory distress. He has no wheezes. He has no rales. He exhibits no tenderness.  Abdominal: Soft. Bowel sounds are normal. He exhibits no distension and no mass. There is no tenderness. There is no rebound and no guarding.       A  lower midline incision well-healed. Left-sided colostomy healthy, stool brown. No dominant distention. No bowel tenderness. No abdominal mass.  Genitourinary:       Perineal wound healed. Notable. No saline this. No tenderness. No drainage. No scrotal mass.  Musculoskeletal: Normal range of motion. He exhibits no edema and no tenderness.  Lymphadenopathy:    He has no cervical adenopathy.  Neurological: He is alert and oriented to person, place, and time. He has normal reflexes. Coordination normal.  Skin: Skin is warm and dry. No rash noted. He is not diaphoretic. No erythema. No pallor.  Psychiatric: He has a normal mood and affect. His behavior is normal. Judgment and thought content normal.    Data Reviewed CT scan from [facility] Lab work and office notes from [facility]. Office notes from Dr. Brunilda Payor My old records.  Assessment    Complex presacral pelvic fluid collection. I am concerned that this is recurrent necrotic tumor. It may or may not be secondarily infected. This will need to be drained because of symptoms and to clarify the diagnosis.  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 Status post neoadjuvant chemotherapy radiation and adjuvant  chemotherapy.  Intermittent gross hematuria  Rising CEA level  Anemia  Coronary artery disease  Chronic systolic congestive heart failure  Nonischemic cardiomyopathy  6 mm left pulmonary nodule, followup CT 12 months recommended.    Plan    Will schedule for CT-guided percutaneous drainage of his pelvic fluid collection by [facility] interventional radiology.  The patient will be admitted to the hospital under my name for observation and antibiotic coverage  The fluid sent for cytology and culture  I will notify his other physicians  Discontinue aspirin 3 days prior to procedure.       [name]. [name], M.D., [facility] General and Minimally invasive Surgery Breast and Colorectal Surgery Office:   [phone] Pager:   [phone]  11/29/2011, 4:38 PM

## 2011-11-29 NOTE — Patient Instructions (Signed)
You will be scheduled for percutaneous drainage of your pelvic fluid collection in [facility] radiology department. You will be admitted to Dr. [name] to the hospital for a day or 2 after that is done to make sure there are no problems.  Do not take your aspirin for 3 days prior to the procedure.

## 2011-11-29 NOTE — Patient Instructions (Signed)
Your physician recommends that you schedule a follow-up appointment in: 1-2 months with Dr [name] Your physician recommends that you schedule a follow-up appointment in: 1 week with Dr Christell Constant Your physician recommends that you have lab work drawn today [facility])  Your physician has recommended you make the following change in your medication: START Tessalon Pearls as needed for cough  HOLD your Furosemide and Potassium until you see your Primary Care or we tell you otherwise  A chest x-ray takes a picture of the organs and structures inside the chest, including the heart, lungs, and blood vessels. This test can show several things, including, whether the heart is enlarges; whether fluid is building up in the lungs; and whether pacemaker / defibrillator leads are still in place.

## 2011-11-30 ENCOUNTER — Other Ambulatory Visit (INDEPENDENT_AMBULATORY_CARE_PROVIDER_SITE_OTHER): Payer: Self-pay | Admitting: General Surgery

## 2011-11-30 DIAGNOSIS — R188 Other ascites: Secondary | ICD-10-CM

## 2011-11-30 DIAGNOSIS — C189 Malignant neoplasm of colon, unspecified: Secondary | ICD-10-CM

## 2011-12-01 ENCOUNTER — Other Ambulatory Visit: Payer: Self-pay | Admitting: Radiology

## 2011-12-04 ENCOUNTER — Encounter (HOSPITAL_COMMUNITY): Payer: Self-pay | Admitting: Pharmacy Technician

## 2011-12-05 ENCOUNTER — Ambulatory Visit (HOSPITAL_COMMUNITY)
Admission: RE | Admit: 2011-12-05 | Discharge: 2011-12-05 | Disposition: A | Discharge status: Home / Self Care | Payer: BC Managed Care – PPO | Source: Ambulatory Visit | Attending: General Surgery | Admitting: General Surgery

## 2011-12-05 ENCOUNTER — Encounter (HOSPITAL_COMMUNITY): Payer: Self-pay

## 2011-12-05 ENCOUNTER — Other Ambulatory Visit (INDEPENDENT_AMBULATORY_CARE_PROVIDER_SITE_OTHER): Payer: Self-pay | Admitting: General Surgery

## 2011-12-05 ENCOUNTER — Encounter (INDEPENDENT_AMBULATORY_CARE_PROVIDER_SITE_OTHER): Payer: BC Managed Care – PPO | Admitting: General Surgery

## 2011-12-05 VITALS — BP 106/64 | HR 85 | Temp 98.9°F | Resp 16 | Ht 70.0 in | Wt 199.0 lb

## 2011-12-05 DIAGNOSIS — K7460 Unspecified cirrhosis of liver: Secondary | ICD-10-CM | POA: Insufficient documentation

## 2011-12-05 DIAGNOSIS — R188 Other ascites: Secondary | ICD-10-CM

## 2011-12-05 DIAGNOSIS — C189 Malignant neoplasm of colon, unspecified: Secondary | ICD-10-CM

## 2011-12-05 DIAGNOSIS — R599 Enlarged lymph nodes, unspecified: Secondary | ICD-10-CM | POA: Insufficient documentation

## 2011-12-05 DIAGNOSIS — C20 Malignant neoplasm of rectum: Secondary | ICD-10-CM | POA: Insufficient documentation

## 2011-12-05 DIAGNOSIS — R109 Unspecified abdominal pain: Secondary | ICD-10-CM | POA: Insufficient documentation

## 2011-12-05 DIAGNOSIS — J90 Pleural effusion, not elsewhere classified: Secondary | ICD-10-CM | POA: Insufficient documentation

## 2011-12-05 LAB — COMPREHENSIVE METABOLIC PANEL
ALT: 27 U/L (ref 0–53)
AST: 30 U/L (ref 0–37)
Albumin: 3 g/dL — ABNORMAL LOW (ref 3.5–5.2)
Alkaline Phosphatase: 93 U/L (ref 39–117)
Chloride: 97 mEq/L (ref 96–112)
Potassium: 4.4 mEq/L (ref 3.5–5.1)
Sodium: 135 mEq/L (ref 135–145)
Total Protein: 8.1 g/dL (ref 6.0–8.3)

## 2011-12-05 LAB — CBC WITH DIFFERENTIAL/PLATELET
Hemoglobin: 12 g/dL — ABNORMAL LOW (ref 13.0–17.0)
Lymphocytes Relative: 10 % — ABNORMAL LOW (ref 12–46)
Lymphs Abs: 1 10*3/uL (ref 0.7–4.0)
MCV: 82.9 fL (ref 78.0–100.0)
Monocytes Relative: 9 % (ref 3–12)
Neutrophils Relative %: 79 % — ABNORMAL HIGH (ref 43–77)
Platelets: 377 10*3/uL (ref 150–400)
RBC: 4.45 MIL/uL (ref 4.22–5.81)
WBC: 10.2 10*3/uL (ref 4.0–10.5)

## 2011-12-05 LAB — PROTIME-INR
INR: 1.36 (ref 0.00–1.49)
Prothrombin Time: 17 seconds — ABNORMAL HIGH (ref 11.6–15.2)

## 2011-12-05 MED ORDER — MIDAZOLAM HCL 2 MG/2ML IJ SOLN
INTRAMUSCULAR | Status: AC
  Filled 2011-12-05: qty 4

## 2011-12-05 MED ORDER — SODIUM CHLORIDE 0.9 % IV SOLN: INTRAVENOUS | Status: DC

## 2011-12-05 MED ORDER — IOHEXOL 300 MG/ML  SOLN
80.0000 mL | Freq: Once | INTRAMUSCULAR | Status: AC | PRN
  Administered 2011-12-05: 80 mL via INTRAVENOUS

## 2011-12-05 MED ORDER — MIDAZOLAM HCL 5 MG/5ML IJ SOLN
INTRAMUSCULAR | Status: AC | PRN
  Administered 2011-12-05 (×3): 0.5 mg via INTRAVENOUS

## 2011-12-05 MED ORDER — FENTANYL CITRATE 0.05 MG/ML IJ SOLN
INTRAMUSCULAR | Status: AC
  Filled 2011-12-05: qty 4

## 2011-12-05 MED ORDER — DEXTROSE 5 % IV SOLN: 2.0000 g | INTRAVENOUS | Status: DC

## 2011-12-05 MED ORDER — FENTANYL CITRATE 0.05 MG/ML IJ SOLN
INTRAMUSCULAR | Status: AC | PRN
  Administered 2011-12-05 (×2): 25 ug via INTRAVENOUS

## 2011-12-05 MED ORDER — HYDROCODONE-ACETAMINOPHEN 5-325 MG PO TABS: 1.0000 | ORAL_TABLET | ORAL | Status: DC | PRN

## 2011-12-05 NOTE — H&P (Signed)
Agree with above.  Pelvic mass and collection appear very similar to the prior CT from 10/09/11 suggesting strongly that this is in fact necrotic tumor.  We will proceed with aspiration for gram stain, culture and cytology and avoid placing a drain if at all possible.   Preliminary plan d/w Dr. [name].  I will update him once we see what the aspirated material looks like.  Signed,  Sterling Big, MD Vascular & Interventional Radiologist [facility] Radiology

## 2011-12-05 NOTE — Procedures (Signed)
Interventional Radiology Procedure Note  Procedure:  CT guided aspiration of necrotic pelvic mass vs abscess Complications: None Recommendations: - Small amount of blood and casseous material aspirated. - Sent for Cx and cytology - Observe x 2 hrs - Pt to follow up with Drs. [name] and Darovsky  Signed,  Sterling Big, MD Vascular & Interventional Radiologist [facility] Radiology

## 2011-12-05 NOTE — ED Notes (Signed)
Denies pain

## 2011-12-05 NOTE — H&P (Signed)
Adam Henderson is an 56 y.o. male.   Chief Complaint: Hx Colon Ca Rectal/ pelvic abscess Scheduled for CT scan then possible abscess aspiration vs drain placement To be admitted to Dr [name] after procedure HPI: CAD; hyperlipidemia; colon ca; CHF  Past Medical History  Diagnosis Date  . CAD (coronary artery disease)     a. s/p MI/Cath 02/15/10: thrombectomy and stent in second obtuse marginal ;  b. 10/2011 Cath: patent OM stent, otw nonobs dzs -> med Rx.  Marland Kitchen Hyperlipidemia   . Blood in urine   . Cancer     a. colon- colonoscopy 7/10 no evidence of recurrent  . Ischemic cardiomyopathy     a. 11/02/2011 Echo: EF 30-35%, sev HK of inf, sept, post walls.  Sev dilated LV, mild MR, mod dil LA.  Marland Kitchen Chronic systolic CHF (congestive heart failure)   . Pulmonary nodule, left     a. CT 11/01/2011 6mm solitary pulmonary nodule ant LUL - f/u CT 6 months (04/2012)  . Cough     a. with pleuritic chest pain 11/2011    Past Surgical History  Procedure Date  . Proctoscopy   . Ureteral repair   . Cardiac stents   . Colon surgery     abdomeno perineal resection  . Colostomy   . Portacath placement     Family History  Problem Relation Age of Onset  . Cancer Mother     breast  . Diabetes Father   . Colon cancer Neg Hx    Social History:  reports that he has never smoked. He has never used smokeless tobacco. He reports that he does not drink alcohol or use illicit drugs.  Allergies: No Known Allergies   (Not in a hospital admission)  No results found for this or any previous visit (from the past 48 hour(s)). No results found.  Review of Systems  Constitutional: Positive for fever and weight loss.  Respiratory: Positive for cough. Negative for shortness of breath.   Gastrointestinal: Positive for nausea, vomiting and abdominal pain.  Neurological: Positive for weakness. Negative for headaches.  Psychiatric/Behavioral: The patient is nervous/anxious.     Blood pressure 98/68, pulse 86,  temperature 99.4 F (37.4 C), temperature source Oral, resp. rate 18, height 5\' 10"  (1.778 m), weight 199 lb (90.266 kg), SpO2 94.00%. Physical Exam  Constitutional: He is oriented to person, place, and time. He appears well-developed.  Cardiovascular: Normal rate and normal heart sounds.   No murmur heard. Respiratory: Effort normal and breath sounds normal. He has no wheezes.  GI: Soft. Bowel sounds are normal. There is tenderness.  Musculoskeletal: Normal range of motion.  Neurological: He is alert and oriented to person, place, and time.  Psychiatric: He has a normal mood and affect. His behavior is normal. Judgment and thought content normal.     Assessment/Plan Hx Colon Ca Rectal/pelvic abscess; pelvic pain Scheduled for CT scan and poss aspiration vs drain Pt and wife aware of procedure benefits and risks and agreeable to proceed. Consent signed and in chart TBA after procedure to Dr [name]; pt aware  Tiana Sivertson A 12/05/2011, 10:05 AM

## 2011-12-05 NOTE — Progress Notes (Signed)
REPORT RECEIVED FROM [facility]

## 2011-12-08 ENCOUNTER — Telehealth (INDEPENDENT_AMBULATORY_CARE_PROVIDER_SITE_OTHER): Payer: Self-pay | Admitting: General Surgery

## 2011-12-08 LAB — CULTURE, ROUTINE-ABSCESS: Culture: NO GROWTH

## 2011-12-08 NOTE — Telephone Encounter (Signed)
The pathology report from the percutaneous biopsy of his presacral mass and fluid collection shows adenocarcinoma.  I called Adam Henderson and told him the results of this. I told him that I did not think there was any indication for surgery, but that he would need to call Dr. Ala Bent make an appointment to discuss whether  further chemotherapy would be indicated. He stated that he would call [name] and set this up.  He has an appointment to see me in about 3 weeks.   [name]. [name], M.D., [facility] General and Minimally invasive Surgery Breast and Colorectal Surgery Office:   [phone] Pager:   [phone]

## 2011-12-10 LAB — ANAEROBIC CULTURE

## 2011-12-18 ENCOUNTER — Emergency Department (HOSPITAL_COMMUNITY): Payer: BC Managed Care – PPO

## 2011-12-18 ENCOUNTER — Encounter (HOSPITAL_COMMUNITY): Payer: Self-pay | Admitting: *Deleted

## 2011-12-18 ENCOUNTER — Inpatient Hospital Stay (HOSPITAL_COMMUNITY)
Admission: EM | Admit: 2011-12-18 | Discharge: 2011-12-28 | DRG: 541 | Disposition: A | Discharge status: Home / Self Care | Payer: BC Managed Care – PPO | Attending: Internal Medicine | Admitting: Internal Medicine

## 2011-12-18 DIAGNOSIS — I472 Ventricular tachycardia, unspecified: Secondary | ICD-10-CM | POA: Diagnosis present

## 2011-12-18 DIAGNOSIS — C189 Malignant neoplasm of colon, unspecified: Secondary | ICD-10-CM

## 2011-12-18 DIAGNOSIS — K900 Celiac disease: Secondary | ICD-10-CM

## 2011-12-18 DIAGNOSIS — I4729 Other ventricular tachycardia: Secondary | ICD-10-CM | POA: Diagnosis present

## 2011-12-18 DIAGNOSIS — R6510 Systemic inflammatory response syndrome (SIRS) of non-infectious origin without acute organ dysfunction: Secondary | ICD-10-CM

## 2011-12-18 DIAGNOSIS — C20 Malignant neoplasm of rectum: Secondary | ICD-10-CM

## 2011-12-18 DIAGNOSIS — D509 Iron deficiency anemia, unspecified: Secondary | ICD-10-CM

## 2011-12-18 DIAGNOSIS — A419 Sepsis, unspecified organism: Secondary | ICD-10-CM | POA: Diagnosis present

## 2011-12-18 DIAGNOSIS — Z7982 Long term (current) use of aspirin: Secondary | ICD-10-CM

## 2011-12-18 DIAGNOSIS — R911 Solitary pulmonary nodule: Secondary | ICD-10-CM

## 2011-12-18 DIAGNOSIS — E871 Hypo-osmolality and hyponatremia: Secondary | ICD-10-CM | POA: Diagnosis present

## 2011-12-18 DIAGNOSIS — C786 Secondary malignant neoplasm of retroperitoneum and peritoneum: Secondary | ICD-10-CM | POA: Diagnosis present

## 2011-12-18 DIAGNOSIS — I2510 Atherosclerotic heart disease of native coronary artery without angina pectoris: Secondary | ICD-10-CM

## 2011-12-18 DIAGNOSIS — Z85048 Personal history of other malignant neoplasm of rectum, rectosigmoid junction, and anus: Secondary | ICD-10-CM

## 2011-12-18 DIAGNOSIS — I509 Heart failure, unspecified: Secondary | ICD-10-CM | POA: Diagnosis present

## 2011-12-18 DIAGNOSIS — I959 Hypotension, unspecified: Secondary | ICD-10-CM | POA: Diagnosis present

## 2011-12-18 DIAGNOSIS — Z9861 Coronary angioplasty status: Secondary | ICD-10-CM

## 2011-12-18 DIAGNOSIS — R0600 Dyspnea, unspecified: Secondary | ICD-10-CM

## 2011-12-18 DIAGNOSIS — I5022 Chronic systolic (congestive) heart failure: Secondary | ICD-10-CM

## 2011-12-18 DIAGNOSIS — J910 Malignant pleural effusion: Principal | ICD-10-CM | POA: Diagnosis present

## 2011-12-18 DIAGNOSIS — J90 Pleural effusion, not elsewhere classified: Secondary | ICD-10-CM

## 2011-12-18 DIAGNOSIS — E785 Hyperlipidemia, unspecified: Secondary | ICD-10-CM | POA: Diagnosis present

## 2011-12-18 DIAGNOSIS — I252 Old myocardial infarction: Secondary | ICD-10-CM

## 2011-12-18 DIAGNOSIS — R079 Chest pain, unspecified: Secondary | ICD-10-CM

## 2011-12-18 DIAGNOSIS — D72829 Elevated white blood cell count, unspecified: Secondary | ICD-10-CM | POA: Diagnosis present

## 2011-12-18 DIAGNOSIS — I5023 Acute on chronic systolic (congestive) heart failure: Secondary | ICD-10-CM

## 2011-12-18 DIAGNOSIS — I428 Other cardiomyopathies: Secondary | ICD-10-CM | POA: Diagnosis present

## 2011-12-18 LAB — CBC WITH DIFFERENTIAL/PLATELET
Eosinophils Absolute: 0.2 10*3/uL (ref 0.0–0.7)
Eosinophils Relative: 1 % (ref 0–5)
HCT: 35.7 % — ABNORMAL LOW (ref 39.0–52.0)
Hemoglobin: 11.5 g/dL — ABNORMAL LOW (ref 13.0–17.0)
Lymphocytes Relative: 9 % — ABNORMAL LOW (ref 12–46)
Lymphs Abs: 1.4 10*3/uL (ref 0.7–4.0)
MCH: 26.7 pg (ref 26.0–34.0)
MCV: 83 fL (ref 78.0–100.0)
Monocytes Absolute: 1.2 10*3/uL — ABNORMAL HIGH (ref 0.1–1.0)
Monocytes Relative: 9 % (ref 3–12)
Platelets: 500 10*3/uL — ABNORMAL HIGH (ref 150–400)
RBC: 4.3 MIL/uL (ref 4.22–5.81)
WBC: 14.5 10*3/uL — ABNORMAL HIGH (ref 4.0–10.5)

## 2011-12-18 LAB — PROTIME-INR
INR: 1.45 (ref 0.00–1.49)
Prothrombin Time: 17.9 seconds — ABNORMAL HIGH (ref 11.6–15.2)

## 2011-12-18 LAB — COMPREHENSIVE METABOLIC PANEL
ALT: 5 U/L (ref 0–53)
BUN: 5 mg/dL — ABNORMAL LOW (ref 6–23)
CO2: 27 mEq/L (ref 19–32)
Calcium: 9.4 mg/dL (ref 8.4–10.5)
Creatinine, Ser: 0.61 mg/dL (ref 0.50–1.35)
GFR calc Af Amer: >90 mL/min (ref >90)
GFR calc non Af Amer: >90 mL/min (ref >90)
Glucose, Bld: 133 mg/dL — ABNORMAL HIGH (ref 70–99)
Sodium: 136 mEq/L (ref 135–145)
Total Protein: 8.3 g/dL (ref 6.0–8.3)

## 2011-12-18 LAB — TROPONIN I: Troponin I: <0.3 ng/mL (ref <0.30)

## 2011-12-18 MED ORDER — LEVOFLOXACIN IN D5W 750 MG/150ML IV SOLN
750.0000 mg | INTRAVENOUS | Status: DC
  Administered 2011-12-18: 750 mg via INTRAVENOUS
  Filled 2011-12-18: qty 150

## 2011-12-18 MED ORDER — MORPHINE SULFATE 4 MG/ML IJ SOLN
4.0000 mg | Freq: Once | INTRAMUSCULAR | Status: AC
  Administered 2011-12-18: 4 mg via INTRAVENOUS
  Filled 2011-12-18: qty 1

## 2011-12-18 MED ORDER — PIPERACILLIN-TAZOBACTAM 4.5 G IVPB
4.5000 g | Freq: Once | INTRAVENOUS | Status: AC
  Administered 2011-12-18: 4.5 g via INTRAVENOUS
  Filled 2011-12-18: qty 100

## 2011-12-18 MED ORDER — IOHEXOL 350 MG/ML SOLN
100.0000 mL | Freq: Once | INTRAVENOUS | Status: AC | PRN
  Administered 2011-12-18: 100 mL via INTRAVENOUS

## 2011-12-18 NOTE — ED Provider Notes (Signed)
History     CSN: [id number]  Arrival date & time 12/18/11  1802   First MD Initiated Contact with Patient 12/18/11 1939      Chief Complaint  Patient presents with  . multiple symptoms     (Consider location/radiation/quality/duration/timing/severity/associated sxs/prior treatment) HPI Comments: [name] presents from his [facility] primary provider's office for further evaluation.  He has a hx of CHF, CAD, colon Ca, and has been treated over the last 2 weeks for pneumonia.  Despite taking the antibiotic as prescribed, the symptoms have not improved.  He reports feeling SOB with a mildly productive cough.  He also reports fever, back and chest pain, and a generalized fatigue.  The history is provided by the patient. No language interpreter was used.    Past Medical History  Diagnosis Date  . CAD (coronary artery disease)     a. s/p MI/Cath 02/15/10: thrombectomy and stent in second obtuse marginal ;  b. 10/2011 Cath: patent OM stent, otw nonobs dzs -> med Rx.  Marland Kitchen Hyperlipidemia   . Blood in urine   . Cancer     a. colon- colonoscopy 7/10 no evidence of recurrent  . Ischemic cardiomyopathy     a. 11/02/2011 Echo: EF 30-35%, sev HK of inf, sept, post walls.  Sev dilated LV, mild MR, mod dil LA.  Marland Kitchen Chronic systolic CHF (congestive heart failure)   . Pulmonary nodule, left     a. CT 11/01/2011 6mm solitary pulmonary nodule ant LUL - f/u CT 6 months (04/2012)  . Cough     a. with pleuritic chest pain 11/2011    Past Surgical History  Procedure Date  . Proctoscopy   . Ureteral repair   . Cardiac stents   . Colon surgery     abdomeno perineal resection  . Colostomy   . Portacath placement     Family History  Problem Relation Age of Onset  . Cancer Mother     breast  . Diabetes Father   . Colon cancer Neg Hx     History  Substance Use Topics  . Smoking status: Never Smoker   . Smokeless tobacco: Never Used  . Alcohol Use: No      Review of Systems    Constitutional: Positive for fever, chills, appetite change and fatigue. Negative for diaphoresis and activity change.  HENT: Negative for congestion, sore throat, facial swelling, rhinorrhea, trouble swallowing, neck pain and neck stiffness.   Eyes: Negative.   Respiratory: Positive for cough, chest tightness and shortness of breath. Negative for wheezing.   Cardiovascular: Positive for chest pain. Negative for palpitations and leg swelling.  Gastrointestinal: Negative for nausea, vomiting, diarrhea, constipation, blood in stool and abdominal distention.  Genitourinary: Negative.   Musculoskeletal: Positive for back pain and arthralgias. Negative for joint swelling and gait problem.  Skin: Positive for pallor. Negative for rash and wound.  Neurological: Positive for weakness and light-headedness. Negative for dizziness and syncope.  Hematological: Negative.   Psychiatric/Behavioral: Negative.     Allergies  Review of patient's allergies indicates no known allergies.  Home Medications   Current Outpatient Rx  Name Route Sig Dispense Refill  . AMOXICILLIN-POT CLAVULANATE 875-125 MG PO TABS Oral Take 1 tablet by mouth 2 (two) times daily. Started medication on Wednesday 12-13-11    . ASPIRIN EC 81 MG PO TBEC Oral Take 81 mg by mouth daily.    Marland Kitchen BENZONATATE 100 MG PO CAPS Oral Take 100 mg by mouth 3 (three) times  daily as needed. For cough.    Marland Kitchen HYDROCODONE-ACETAMINOPHEN 5-325 MG PO TABS Oral Take 1 tablet by mouth every 4 (four) hours as needed. For pain.    . IRON PO Oral Take 1 tablet by mouth daily.     Marland Kitchen PANTOPRAZOLE SODIUM 40 MG PO TBEC Oral Take 40 mg by mouth daily.      Marland Kitchen PRAVASTATIN SODIUM 20 MG PO TABS Oral Take 20 mg by mouth at bedtime.      Marland Kitchen PROCHLORPERAZINE MALEATE 10 MG PO TABS Oral Take 10 mg by mouth every 6 (six) hours as needed. nausea    . TAMSULOSIN HCL 0.4 MG PO CAPS Oral Take 0.4 mg by mouth daily.     Marland Kitchen VITAMIN B-12 1000 MCG PO TABS Oral Take 1,000 mcg by mouth  daily.     Marland Kitchen VITAMIN C 500 MG PO TABS Oral Take 500 mg by mouth daily.      BP 101/70  Pulse 103  Temp 97.4 F (36.3 C)  Resp 18  SpO2 97%  Physical Exam  Nursing note and vitals reviewed. Constitutional: He is active and cooperative.  Non-toxic appearance. He has a sickly appearance. He appears ill. No distress. He is not intubated.  HENT:  Head: Normocephalic and atraumatic. Head is without raccoon's eyes and without Battle's sign.  Right Ear: External ear normal.  Left Ear: External ear normal.  Nose: Nose normal.  Mouth/Throat: Oropharynx is clear and moist. No oropharyngeal exudate.       Poor dentition/dental hygiene  Eyes: Pupils are equal, round, and reactive to light. Right eye exhibits no discharge. Left eye exhibits no discharge. No scleral icterus.       Pale conjunctiva  Neck: Normal range of motion. Neck supple. No JVD present. No tracheal deviation present. No thyromegaly present.  Cardiovascular: Regular rhythm, S1 normal, S2 normal, normal heart sounds, intact distal pulses and normal pulses.   No extrasystoles are present. Tachycardia present.  PMI is not displaced.  Exam reveals no gallop and no decreased pulses.   No murmur heard. Pulmonary/Chest: Effort normal. No accessory muscle usage or stridor. No apnea, not tachypneic and not bradypneic. He is not intubated. No respiratory distress. He has decreased breath sounds in the right upper field, the right middle field and the right lower field. He has no wheezes. He has rhonchi. He has rales.  Abdominal: Soft. Bowel sounds are normal. He exhibits no abdominal bruit, no ascites, no pulsatile midline mass and no mass. There is no hepatosplenomegaly. There is no tenderness. There is no rigidity, no rebound, no guarding, no CVA tenderness, no tenderness at McBurney's point and negative Murphy's sign. No hernia.       + colostomy bag, sm amt of soft/loose stool in bag.  Musculoskeletal: Normal range of motion. He exhibits  no edema.  Lymphadenopathy:    He has no cervical adenopathy.  Neurological: He is alert.  Skin: Skin is warm, dry and intact. No abrasion, no bruising, no burn, no ecchymosis, no lesion, no petechiae and no rash noted. He is not diaphoretic. No erythema. There is pallor.  Psychiatric: His behavior is normal. Judgment normal. His mood appears not anxious. His affect is not angry, not blunt, not labile and not inappropriate. His speech is not rapid and/or pressured. He is not agitated, not aggressive, is not hyperactive, not slowed, not withdrawn, not actively hallucinating and not combative. Cognition and memory are normal. Cognition and memory are not impaired. He does not express  impulsivity or inappropriate judgment. He exhibits a depressed mood. He exhibits normal recent memory and normal remote memory. He is attentive.    ED Course  Procedures (including critical care time)  Labs Reviewed  CBC WITH DIFFERENTIAL - Abnormal; Notable for the following:    WBC 14.5 (*)     Hemoglobin 11.5 (*)     HCT 35.7 (*)     Platelets 500 (*)     Neutrophils Relative 81 (*)     Neutro Abs 11.7 (*)     Lymphocytes Relative 9 (*)     Monocytes Absolute 1.2 (*)     All other components within normal limits  COMPREHENSIVE METABOLIC PANEL - Abnormal; Notable for the following:    Glucose, Bld 133 (*)     BUN 5 (*)     Albumin 2.7 (*)     All other components within normal limits  TROPONIN I  APTT  PROTIME-INR  CULTURE, BLOOD (ROUTINE X 2)  CULTURE, BLOOD (ROUTINE X 2)   Dg Chest 2 View  12/18/2011  *RADIOLOGY REPORT*  Clinical Data: Short of breath and pneumonia  CHEST - 2 VIEW  Comparison: 11/29/2011  Findings: Large left effusion has progressed from the  prior study. There is collapse of the left lower lobe.  Right lung is clear. Port-A-Cath tip remains in the lower SVC.  No heart failure.  IMPRESSION: Large left effusion has progressed from the prior study.  There is collapse of the left lower  lobe.  Thoracentesis is suggested to rule out underlying malignancy or infection.   Original Report Authenticated By: Camelia Phenes, M.D.      No diagnosis found.   Date: 12/18/2011  Rate: 107 bpm  Rhythm: sinus tachycardia  QRS Axis: normal  Intervals: normal  ST/T Wave abnormalities: nonspecific ST changes  Conduction Disutrbances:poor R progression  Narrative Interpretation:   Old EKG Reviewed: unchanged      MDM  Pt presents for evaluation of chest discomfort, cough, and a persistent pneumonia.  He has been on augmentin by his primary provider for over a week.  He reports worsening shortness of breath.  2230.  Pt stable, NAD.  Pt has a large left-sided effusion and the LLL is not visible on CXR.   Unsure if this is secondary to infection, neoplasm, or other.  Broad abx (zosyn and levaquin) administered after cultures were obtained.  CT of chest with contrast ordered.  Pt has also c/o chest pain and dyspnea.  The CT scan will allow a better evaluation of his lung parenchyma as well as the pulmonary vasculature. Discussed his evaluation wth the on-call hospitalist, plan admit for further mgmnt.      Tobin Chad, MD 12/18/11 2255

## 2011-12-18 NOTE — ED Notes (Signed)
IV team notified for IV placement appropriate for CT angio

## 2011-12-18 NOTE — ED Notes (Signed)
IV team at bedside 

## 2011-12-18 NOTE — ED Notes (Signed)
The pt was sent from [facility] [place] with pneumonia  For 2 weeks sob chest pain weakness.  His antibiotics are not helping.  Exertional sob

## 2011-12-18 NOTE — ED Notes (Addendum)
Patient still in CT.

## 2011-12-18 NOTE — ED Notes (Signed)
The pt also has colon and rectal cancer

## 2011-12-19 ENCOUNTER — Inpatient Hospital Stay (HOSPITAL_COMMUNITY): Payer: BC Managed Care – PPO

## 2011-12-19 ENCOUNTER — Encounter: Payer: BC Managed Care – PPO | Admitting: Internal Medicine

## 2011-12-19 DIAGNOSIS — J90 Pleural effusion, not elsewhere classified: Secondary | ICD-10-CM | POA: Diagnosis present

## 2011-12-19 DIAGNOSIS — I5022 Chronic systolic (congestive) heart failure: Secondary | ICD-10-CM

## 2011-12-19 DIAGNOSIS — C189 Malignant neoplasm of colon, unspecified: Secondary | ICD-10-CM

## 2011-12-19 DIAGNOSIS — A419 Sepsis, unspecified organism: Secondary | ICD-10-CM | POA: Diagnosis present

## 2011-12-19 DIAGNOSIS — I959 Hypotension, unspecified: Secondary | ICD-10-CM | POA: Diagnosis present

## 2011-12-19 DIAGNOSIS — R0609 Other forms of dyspnea: Secondary | ICD-10-CM

## 2011-12-19 DIAGNOSIS — R0989 Other specified symptoms and signs involving the circulatory and respiratory systems: Secondary | ICD-10-CM

## 2011-12-19 LAB — BODY FLUID CELL COUNT WITH DIFFERENTIAL
Eos, Fluid: 3 %
Lymphs, Fluid: 13 %
Monocyte-Macrophage-Serous Fluid: 24 % — ABNORMAL LOW (ref 50–90)
Neutrophil Count, Fluid: 60 % — ABNORMAL HIGH (ref 0–25)
Total Nucleated Cell Count, Fluid: 537 cu mm (ref 0–1000)

## 2011-12-19 LAB — BASIC METABOLIC PANEL
CO2: 27 mEq/L (ref 19–32)
Calcium: 8.6 mg/dL (ref 8.4–10.5)
Creatinine, Ser: 0.61 mg/dL (ref 0.50–1.35)
GFR calc non Af Amer: >90 mL/min (ref >90)
Glucose, Bld: 123 mg/dL — ABNORMAL HIGH (ref 70–99)
Sodium: 132 mEq/L — ABNORMAL LOW (ref 135–145)

## 2011-12-19 LAB — CBC
MCH: 26.3 pg (ref 26.0–34.0)
MCV: 82 fL (ref 78.0–100.0)
Platelets: 417 10*3/uL — ABNORMAL HIGH (ref 150–400)
RBC: 3.88 MIL/uL — ABNORMAL LOW (ref 4.22–5.81)
RDW: 14.5 % (ref 11.5–15.5)
WBC: 12.6 10*3/uL — ABNORMAL HIGH (ref 4.0–10.5)

## 2011-12-19 LAB — EXPECTORATED SPUTUM ASSESSMENT W GRAM STAIN, RFLX TO RESP C

## 2011-12-19 LAB — PROTEIN, BODY FLUID: Total protein, fluid: 5.4 g/dL

## 2011-12-19 LAB — GLUCOSE, SEROUS FLUID

## 2011-12-19 LAB — GRAM STAIN

## 2011-12-19 MED ORDER — HYDROMORPHONE HCL PF 1 MG/ML IJ SOLN
1.0000 mg | INTRAMUSCULAR | Status: AC | PRN
  Administered 2011-12-19 – 2011-12-20 (×4): 1 mg via INTRAVENOUS
  Filled 2011-12-19 (×4): qty 1

## 2011-12-19 MED ORDER — LEVOFLOXACIN IN D5W 750 MG/150ML IV SOLN
750.0000 mg | INTRAVENOUS | Status: AC
  Administered 2011-12-19 – 2011-12-20 (×2): 750 mg via INTRAVENOUS
  Filled 2011-12-19 (×2): qty 150

## 2011-12-19 MED ORDER — DEXTROSE 5 % IV SOLN
1.0000 g | Freq: Three times a day (TID) | INTRAVENOUS | Status: DC
  Administered 2011-12-19 – 2011-12-22 (×10): 1 g via INTRAVENOUS
  Filled 2011-12-19 (×14): qty 1

## 2011-12-19 MED ORDER — VITAMIN B-12 1000 MCG PO TABS
1000.0000 ug | ORAL_TABLET | Freq: Every day | ORAL | Status: DC
  Administered 2011-12-19 – 2011-12-28 (×10): 1000 ug via ORAL
  Filled 2011-12-19 (×10): qty 1

## 2011-12-19 MED ORDER — VANCOMYCIN HCL IN DEXTROSE 1-5 GM/200ML-% IV SOLN
1000.0000 mg | Freq: Three times a day (TID) | INTRAVENOUS | Status: DC
  Administered 2011-12-19 – 2011-12-21 (×7): 1000 mg via INTRAVENOUS
  Filled 2011-12-19 (×9): qty 200

## 2011-12-19 MED ORDER — VITAMIN C 500 MG PO TABS
500.0000 mg | ORAL_TABLET | Freq: Every day | ORAL | Status: DC
  Administered 2011-12-19 – 2011-12-28 (×10): 500 mg via ORAL
  Filled 2011-12-19 (×10): qty 1

## 2011-12-19 MED ORDER — SODIUM CHLORIDE 0.9 % IJ SOLN
3.0000 mL | Freq: Two times a day (BID) | INTRAMUSCULAR | Status: DC
  Administered 2011-12-19: 10 mL via INTRAVENOUS
  Administered 2011-12-21 – 2011-12-26 (×4): 3 mL via INTRAVENOUS

## 2011-12-19 MED ORDER — VANCOMYCIN HCL IN DEXTROSE 1-5 GM/200ML-% IV SOLN
1000.0000 mg | INTRAVENOUS | Status: AC
  Administered 2011-12-19: 1000 mg via INTRAVENOUS

## 2011-12-19 MED ORDER — SIMVASTATIN 10 MG PO TABS
10.0000 mg | ORAL_TABLET | Freq: Every day | ORAL | Status: DC
  Administered 2011-12-19 – 2011-12-27 (×9): 10 mg via ORAL
  Filled 2011-12-19 (×10): qty 1

## 2011-12-19 MED ORDER — ALBUTEROL SULFATE (5 MG/ML) 0.5% IN NEBU: 2.5000 mg | INHALATION_SOLUTION | RESPIRATORY_TRACT | Status: DC | PRN

## 2011-12-19 MED ORDER — PROCHLORPERAZINE MALEATE 10 MG PO TABS
10.0000 mg | ORAL_TABLET | Freq: Four times a day (QID) | ORAL | Status: DC | PRN
  Administered 2011-12-22: 10 mg via ORAL
  Filled 2011-12-19: qty 1

## 2011-12-19 MED ORDER — BENZONATATE 100 MG PO CAPS
100.0000 mg | ORAL_CAPSULE | Freq: Three times a day (TID) | ORAL | Status: DC | PRN
  Administered 2011-12-19 – 2011-12-20 (×2): 100 mg via ORAL
  Filled 2011-12-19 (×3): qty 1

## 2011-12-19 MED ORDER — PANTOPRAZOLE SODIUM 40 MG PO TBEC
40.0000 mg | DELAYED_RELEASE_TABLET | Freq: Every day | ORAL | Status: DC
  Administered 2011-12-19 – 2011-12-28 (×10): 40 mg via ORAL
  Filled 2011-12-19 (×10): qty 1

## 2011-12-19 MED ORDER — SIMVASTATIN 10 MG PO TABS
10.0000 mg | ORAL_TABLET | Freq: Every day | ORAL | Status: DC
  Filled 2011-12-19: qty 1

## 2011-12-19 MED ORDER — HYDROCODONE-ACETAMINOPHEN 5-325 MG PO TABS
1.0000 | ORAL_TABLET | ORAL | Status: DC | PRN
  Administered 2011-12-19 – 2011-12-22 (×4): 1 via ORAL
  Filled 2011-12-19 (×4): qty 1

## 2011-12-19 MED ORDER — SODIUM CHLORIDE 0.9 % IV SOLN
INTRAVENOUS | Status: DC
  Administered 2011-12-19 – 2011-12-20 (×4): via INTRAVENOUS

## 2011-12-19 MED ORDER — SODIUM CHLORIDE 0.9 % IJ SOLN
10.0000 mL | INTRAMUSCULAR | Status: DC | PRN
  Administered 2011-12-20 – 2011-12-26 (×8): 10 mL

## 2011-12-19 NOTE — H&P (Signed)
[facility] History and Physical  LI FRAGOSO ZOX:[id number] DOB: 01/20/1956 DOA: 12/18/2011  Referring physician: ED PCP: [name], MD   Chief Complaint: SOB and chest pain  HPI: Adam Henderson is a 56 y.o. male who for the past few weeks has been having progressively worsening shortness of breath, non-productive cough, night sweats.  The patient has been taking an antibiotic prescribed by his PCP but despite this has been becoming more short of breath, having worsening chest pain and developing worsening fatigue.  His recent medical history is significant for a recent negative heart cath in July, as well as a history of colon cancer with known metastatic disease seen in a biopsy specimen on 12/05/11.  On presentation to the ED this evening the patient was hypotensive, tachycardic, and tachypnic, fortunately he responded to fluids and was able to be stabilized hemodynamically.  The patient underwent CXR and CT angio of the chest which demonstrated no PE but did demonstrate a marked increase in a previously seen L pleural effusion which now nearly fills the entirety of the L chest, ironically the patient complains of predominately R sided chest pain worse when he takes a deep breath.  Given this and given the worsening overall condition of the patient, we have been asked to admit.  Review of Systems: The patient denies anorexia, may have fever but isnt sure, weight loss, vision loss, decreased hearing, hoarseness, chest pain, syncope, dyspnea on exertion, peripheral edema, balance deficits, hemoptysis, abdominal pain, does have maroon colored stools, hematochezia, severe indigestion/heartburn, hematuria, incontinence, genital sores, muscle weakness, suspicious skin lesions, transient blindness, difficulty walking, depression, unusual weight change, abnormal bleeding, enlarged lymph nodes, angioedema, and breast masses.   Past Medical History  Diagnosis Date  . CAD (coronary artery  disease)     a. s/p MI/Cath 02/15/10: thrombectomy and stent in second obtuse marginal ;  b. 10/2011 Cath: patent OM stent, otw nonobs dzs -> med Rx.  Marland Kitchen Hyperlipidemia   . Blood in urine   . Cancer     a. colon- colonoscopy 7/10 no evidence of recurrent  . Ischemic cardiomyopathy     a. 11/02/2011 Echo: EF 30-35%, sev HK of inf, sept, post walls.  Sev dilated LV, mild MR, mod dil LA.  Marland Kitchen Chronic systolic CHF (congestive heart failure)   . Pulmonary nodule, left     a. CT 11/01/2011 6mm solitary pulmonary nodule ant LUL - f/u CT 6 months (04/2012)  . Cough     a. with pleuritic chest pain 11/2011   Past Surgical History  Procedure Date  . Proctoscopy   . Ureteral repair   . Cardiac stents   . Colon surgery     abdomeno perineal resection  . Colostomy   . Portacath placement    Social History:  reports that he has never smoked. He has never used smokeless tobacco. He reports that he does not drink alcohol or use illicit drugs. Patient lives at home and performs all ADLs.  No Known Allergies  Family History  Problem Relation Age of Onset  . Cancer Mother     breast  . Diabetes Father   . Colon cancer Neg Hx    Prior to Admission medications   Medication Sig Start Date End Date Taking? Authorizing Provider  amoxicillin-clavulanate (AUGMENTIN) 875-125 MG per tablet Take 1 tablet by mouth 2 (two) times daily. Started medication on Wednesday 12-13-11   Yes Historical Provider, MD  aspirin EC 81 MG tablet Take 81 mg  by mouth daily.   Yes Historical Provider, MD  benzonatate (TESSALON) 100 MG capsule Take 100 mg by mouth 3 (three) times daily as needed. For cough.   Yes Historical Provider, MD  HYDROcodone-acetaminophen (NORCO/VICODIN) 5-325 MG per tablet Take 1 tablet by mouth every 4 (four) hours as needed. For pain. 11/22/11  Yes Historical Provider, MD  IRON PO Take 1 tablet by mouth daily.    Yes Historical Provider, MD  pantoprazole (PROTONIX) 40 MG tablet Take 40 mg by mouth daily.      Yes Historical Provider, MD  pravastatin (PRAVACHOL) 20 MG tablet Take 20 mg by mouth at bedtime.     Yes Historical Provider, MD  prochlorperazine (COMPAZINE) 10 MG tablet Take 10 mg by mouth every 6 (six) hours as needed. nausea   Yes Historical Provider, MD  Tamsulosin HCl (FLOMAX) 0.4 MG CAPS Take 0.4 mg by mouth daily.  08/11/11  Yes Historical Provider, MD  vitamin B-12 (CYANOCOBALAMIN) 1000 MCG tablet Take 1,000 mcg by mouth daily.    Yes Historical Provider, MD  vitamin C (ASCORBIC ACID) 500 MG tablet Take 500 mg by mouth daily.   Yes Historical Provider, MD   Physical Exam: Filed Vitals:   12/18/11 2252 12/18/11 2359 12/19/11 0001 12/19/11 0003  BP: 104/56 108/74 124/98 96/52  Pulse:  104 114 115  Temp:  98.2 F (36.8 C)    TempSrc:  Oral    Resp: 20 22 23 15   SpO2: 97% 98% 97% 98%     General:  NAD, pale appearing, lying on R side  Eyes: PEERLA EOMI  ENT: mucous membrains moist  Neck: supple, w/o JVD  Cardiovascular: regular rate and rhythm, w/o MRG  Respiratory: absent breath sounds on the L side  Abdomen: soft, mild diffuse tenderness, bs+, ostomy bag in place on L side with some stool output  Skin: w/d/i  Musculoskeletal: gait not tested, 5/5 strengths in BUE, BLE  Neurologic: grosly non-focal  Labs on Admission:  Basic Metabolic Panel:  Lab 12/18/11 1610  NA 136  K 4.6  CL 97  CO2 27  GLUCOSE 133*  BUN 5*  CREATININE 0.61  CALCIUM 9.4  MG --  PHOS --   Liver Function Tests:  Lab 12/18/11 1832  AST 12  ALT 5  ALKPHOS 77  BILITOT 0.3  PROT 8.3  ALBUMIN 2.7*   No results found for this basename: LIPASE:5,AMYLASE:5 in the last 168 hours No results found for this basename: AMMONIA:5 in the last 168 hours CBC:  Lab 12/18/11 1832  WBC 14.5*  NEUTROABS 11.7*  HGB 11.5*  HCT 35.7*  MCV 83.0  PLT 500*   Cardiac Enzymes:  Lab 12/18/11 1841  CKTOTAL --  CKMB --  CKMBINDEX --  TROPONINI <0.30    BNP (last 3 results)  Basename  11/16/11 1150 11/01/11 1532  PROBNP 229.0* 1658.0*   CBG: No results found for this basename: GLUCAP:5 in the last 168 hours  Radiological Exams on Admission: Dg Chest 2 View  12/18/2011  *RADIOLOGY REPORT*  Clinical Data: Short of breath and pneumonia  CHEST - 2 VIEW  Comparison: 11/29/2011  Findings: Large left effusion has progressed from the  prior study. There is collapse of the left lower lobe.  Right lung is clear. Port-A-Cath tip remains in the lower SVC.  No heart failure.  IMPRESSION: Large left effusion has progressed from the prior study.  There is collapse of the left lower lobe.  Thoracentesis is suggested to rule out  underlying malignancy or infection.   Original Report Authenticated By: Camelia Phenes, HendersonD.    Ct Angio Chest Pe W/cm &/or Wo Cm  12/18/2011  *RADIOLOGY REPORT*  Clinical Data: Chest pain and shortness of breath.  History of colon cancer.  CT ANGIOGRAPHY CHEST  Technique:  Multidetector CT imaging of the chest using the standard protocol during bolus administration of intravenous contrast. Multiplanar reconstructed images including MIPs were obtained and reviewed to evaluate the vascular anatomy.  Contrast: OMNIPAQUE IOHEXOL 350 MG/ML SOLN  Comparison: PA and lateral chest earlier this same day and CT chest 11/02/2011.  Findings: No pulmonary embolus is identified.  Since the prior CT scan, the small left pleural effusion seen on the prior study has increased in size and now nearly fills the left chest.  Trace amount of pleural fluid seen on the prior study has resolved. There is a small pericardial effusion, increased in size since the prior study.  Cardiomegaly is noted.  There are some small mediastinal lymph nodes but no pathologic lymphadenopathy by CT size criteria in the axilla, hila or mediastinum is identified. Coronary atherosclerotic vascular disease is noted.  The lungs demonstrate mark compressive atelectasis of the left lung.  The right lung is clear.   Incidentally imaged upper abdomen is unremarkable.  No focal bony abnormality is identified.  IMPRESSION:  1.  Negative for pulmonary embolus. 2.  Marked increase in a left pleural effusion which now nearly fills the left chest.  Associated compressive atelectasis of the left lung is seen. 3.  New very small pericardial effusion. 4.  Calcific coronary artery disease.  Cardiomegaly also noted.   Original Report Authenticated By: Bernadene Bell. D'ALESSIO, HendersonD.     EKG: Independently reviewed  Assessment/Plan Principal Problem:  *Pleural effusion Active Problems:  ADENOCARCINOMA  SIRS (systemic inflammatory response syndrome)  Hypotension   1. Pleural effusion - concern for malignant effusion vs infectious cause, will put patient on HCAP treatment for broad spectrum coverage though it dosent appear he has a PNA on CT of his chest.  Likely will need to tap this effusion while here both therapeutically and also to determine if this is indeed a malignant effusion as I fear it may be especially given the recent known metastatic disease found on biopsy 12/05/11.  I have discussed this concern with the patient.  Have called pulmonology about doing this in the morning. 2. SIRS - with hypotension, concerning for possible infection involving the pleural effusion vs other site of infection, on HCAP coverage empirically at this point in time, responded well to fluid resuscitation.  The abdomen is another possible site of infection but less likely given that the culture done of the area of the surgical bed on 12/05/11 did not grow any organisms.  There were tumor cells found there however. 3. Leukocytosis - WBC of 14.5 monitor with CBC in AM 4. Maroon colored stools - most likely due to iron supplementation but will order hemoccult just in case.  Code Status: Full code Family Communication: No family at bedside at this time Disposition Plan: Admit to inpatient  Time spent: 50 mins  Adam Nall M. [facility] Pager [phone]  If 7PM-7AM, please contact night-coverage [email or web address] Password [facility] 12/19/2011, 12:35 AM

## 2011-12-19 NOTE — Procedures (Signed)
Thoracentesis Procedure Note  Pre-operative Diagnosis: Left pleural effusion with hx of colon cancer  Post-operative Diagnosis: same  Indications: Left pleural effusion  Procedure Details  Consent: Informed consent was obtained. Risks of the procedure were discussed including: infection, bleeding, pain, pneumothorax.  Under sterile conditions the patient was positioned. Betadine solution and sterile drapes were utilized.  1% plain lidocaine was used to anesthetize the 6th rib space. Fluid was obtained without any difficulties and minimal blood loss.  A dressing was applied to the wound and wound care instructions were provided.   Findings 1500 ml of yellow/brown cloudy fluid was obtained. A sample was sent to Pathology for cytogenetics, chemical analysis and cell counts, as well as for infection analysis.  Complications:  None; patient tolerated the procedure well.          Condition: stable  Plan A follow up chest x-ray was ordered. Bed Rest for 4 hours. Tylenol 650 mg. for pain.  Attending Attestation: I was present and scrubbed for the entire procedure.  Coralyn Helling, MD [facility] Pulmonary/Critical Care 12/19/2011, 11:48 AM Pager:  [phone] After 3pm call: [phone]

## 2011-12-19 NOTE — Progress Notes (Signed)
ANTIBIOTIC CONSULT NOTE - INITIAL  Pharmacy Consult for Vancocin/Maxipime/Levaquin Indication: pleural effusion and SIRS  No Known Allergies  Patient Measurements: Height: 5\' 10"  (177.8 cm) Weight: 199 lb 1.2 oz (90.3 kg) IBW/kg (Calculated) : 73   Vital Signs: Temp: 98.2 F (36.8 C) (09/16 2359) Temp src: Oral (09/16 2359) BP: 96/52 mmHg (09/17 0003) Pulse Rate: 115  (09/17 0003)  Labs:  Basename 12/18/11 1832  WBC 14.5*  HGB 11.5*  PLT 500*  LABCREA --  CREATININE 0.61   Estimated Creatinine Clearance: 119.3 ml/min (by C-G formula based on Cr of 0.61).   Microbiology: Recent Results (from the past 720 hour(s))  CULTURE, ROUTINE-ABSCESS     Status: Normal   Collection Time   12/05/11  3:50 PM      Component Value Range Status Comment   Specimen Description ABSCESS PELVIS   Final    Special Requests NONE   Final    Gram Stain     Final    Value: ABUNDANT WBC PRESENT, PREDOMINANTLY PMN     NO SQUAMOUS EPITHELIAL CELLS SEEN     NO ORGANISMS SEEN   Culture NO GROWTH 3 DAYS   Final    Report Status 12/08/2011 FINAL   Final   ANAEROBIC CULTURE     Status: Normal   Collection Time   12/05/11  3:50 PM      Component Value Range Status Comment   Specimen Description ABSCESS PELVIS   Final    Special Requests NONE   Final    Gram Stain     Final    Value: ABUNDANT WBC PRESENT, PREDOMINANTLY PMN     NO ORGANISMS SEEN   Culture NO ANAEROBES ISOLATED   Final    Report Status 12/10/2011 FINAL   Final     Medical History: Past Medical History  Diagnosis Date  . CAD (coronary artery disease)     a. s/p MI/Cath 02/15/10: thrombectomy and stent in second obtuse marginal ;  b. 10/2011 Cath: patent OM stent, otw nonobs dzs -> med Rx.  Marland Kitchen Hyperlipidemia   . Blood in urine   . Cancer     a. colon- colonoscopy 7/10 no evidence of recurrent  . Ischemic cardiomyopathy     a. 11/02/2011 Echo: EF 30-35%, sev HK of inf, sept, post walls.  Sev dilated LV, mild MR, mod dil LA.  Marland Kitchen  Chronic systolic CHF (congestive heart failure)   . Pulmonary nodule, left     a. CT 11/01/2011 6mm solitary pulmonary nodule ant LUL - f/u CT 6 months (04/2012)  . Cough     a. with pleuritic chest pain 11/2011    Medications:   (Not in a hospital admission) Scheduled:    . ceFEPime (MAXIPIME) IV  1 g Intravenous Q8H  . levofloxacin (LEVAQUIN) IV  750 mg Intravenous Q24H  . morphine  4 mg Intravenous Once  . pantoprazole  40 mg Oral Daily  . piperacillin-tazobactam (ZOSYN)  IV  4.5 g Intravenous Once  . simvastatin  10 mg Oral q1800  . sodium chloride  3 mL Intravenous Q12H  . vancomycin  1,000 mg Intravenous NOW  . vancomycin  1,000 mg Intravenous Q8H  . vitamin B-12  1,000 mcg Oral Daily  . vitamin C  500 mg Oral Daily  . DISCONTD: levofloxacin (LEVAQUIN) IV  750 mg Intravenous Q24H    Assessment: 56yo male c/o worsening SOB and non-productive cough despite being on PO ABX per PCP, no evidence of PNA  on CT though does show pleural effusion, concern for malignancy vs infectious cause, also with hypotension concerning for infection, to begin broad-spectrum ABX.  Goal of Therapy:  Vancomycin trough level 15-20 mcg/ml  Plan:  Will begin vancomycin 1g IV Q8H, cefepime 1g IV Q8H, and Levaquin 750mg  IV Q24H and monitor CBC, Cx, levels prn.  Colleen Can PharmD BCPS 12/19/2011,1:08 AM

## 2011-12-19 NOTE — Plan of Care (Signed)
Problem: Phase I Progression Outcomes Goal: Dyspnea controlled at rest Outcome: Completed/Met Date Met:  12/19/11 Pt denies SOB while at rest, goal met Goal: Pain controlled with appropriate interventions Outcome: Completed/Met Date Met:  12/19/11 Pt has had SOB, no c/o SOB today, pt had back back in the am relived with meds, after thorocentesis pt stated he feels better, pt still has cough mostly non-productive, goal met will monitor Goal: OOB as tolerated unless otherwise ordered Outcome: Completed/Met Date Met:  12/19/11 Pt oob ad lib, no c/o dizziness, goal met Goal: First antibiotic given within 6hrs of admit Outcome: Completed/Met Date Met:  12/19/11 Pt has been receiving abx since admission, abx are still being continued, goal  met Goal: Confirm chest x-ray completed Outcome: Completed/Met Date Met:  12/19/11 Pt had CXR today at 1233pm, goal met Goal: Code status addressed with pt/family Outcome: Completed/Met Date Met:  12/19/11 Pt full code, goal met Goal: Initial discharge plan identified Outcome: Completed/Met Date Met:  12/19/11 Pt is from home with  Mother, initial plan is to return home with mother, goal met Goal: Voiding-avoid urinary catheter unless indicated Outcome: Completed/Met Date Met:  12/19/11 Pt voids adequate amounts in urinal, no need for catheterization, goal met

## 2011-12-19 NOTE — Consult Note (Signed)
Name: Adam Henderson CURRENT MRN: [id number] DOB: [birth year]    LOS: 1    History of Present Illness:  56 year old male w/ h/o advanced rectal cancer stage IIIb w/ mets to abd/peritoneum. He is s/p abd/peritoneal resection, completed chemotherapy in October 2012, recently identified as having progression of disease by pelvic mass bx on 9/3. Presented to ER on 9/16 w/ CC SOB, night sweats, NP cough and chest pain. Had been treated for several weeks prior to arrival for what was felt to be a CAP by his PCP. In spite of this therapy he has become progressively SOB w/ worsening CP. Dx eval in the ER demonstrated a large left pleural effuion. Pulmonary was asked to see for fluid evaluation and to assist w/ symptom management.    Cultures: BC x 2 9/16>>>  Antibiotics: Cefepime 9/16 (?CAP)>>> levaquin 9/16>>>  Tests / Events: CT chest 9/16: Large left pleural effusion, compressive ATX, small pericardial effusion, coronary calcification, cardiomegaly   Past Medical History  Diagnosis Date  . CAD (coronary artery disease)     a. s/p MI/Cath 02/15/10: thrombectomy and stent in second obtuse marginal ;  b. 10/2011 Cath: patent OM stent, otw nonobs dzs -> med Rx.  Marland Kitchen Hyperlipidemia   . Blood in urine   . Cancer     a. colon- colonoscopy 7/10 no evidence of recurrent  . Ischemic cardiomyopathy     a. 11/02/2011 Echo: EF 30-35%, sev HK of inf, sept, post walls.  Sev dilated LV, mild MR, mod dil LA.  Marland Kitchen Chronic systolic CHF (congestive heart failure)   . Pulmonary nodule, left     a. CT 11/01/2011 6mm solitary pulmonary nodule ant LUL - f/u CT 6 months (04/2012)  . Cough     a. with pleuritic chest pain 11/2011   Past Surgical History  Procedure Date  . Proctoscopy   . Ureteral repair   . Cardiac stents   . Colon surgery     abdomeno perineal resection  . Colostomy   . Portacath placement    Prior to Admission medications   Medication Sig Start Date End Date Taking? Authorizing Provider    amoxicillin-clavulanate (AUGMENTIN) 875-125 MG per tablet Take 1 tablet by mouth 2 (two) times daily. Started medication on Wednesday 12-13-11   Yes Historical Provider, MD  aspirin EC 81 MG tablet Take 81 mg by mouth daily.   Yes Historical Provider, MD  benzonatate (TESSALON) 100 MG capsule Take 100 mg by mouth 3 (three) times daily as needed. For cough.   Yes Historical Provider, MD  HYDROcodone-acetaminophen (NORCO/VICODIN) 5-325 MG per tablet Take 1 tablet by mouth every 4 (four) hours as needed. For pain. 11/22/11  Yes Historical Provider, MD  IRON PO Take 1 tablet by mouth daily.    Yes Historical Provider, MD  pantoprazole (PROTONIX) 40 MG tablet Take 40 mg by mouth daily.     Yes Historical Provider, MD  pravastatin (PRAVACHOL) 20 MG tablet Take 20 mg by mouth at bedtime.     Yes Historical Provider, MD  prochlorperazine (COMPAZINE) 10 MG tablet Take 10 mg by mouth every 6 (six) hours as needed. nausea   Yes Historical Provider, MD  Tamsulosin HCl (FLOMAX) 0.4 MG CAPS Take 0.4 mg by mouth daily.  08/11/11  Yes Historical Provider, MD  vitamin B-12 (CYANOCOBALAMIN) 1000 MCG tablet Take 1,000 mcg by mouth daily.    Yes Historical Provider, MD  vitamin C (ASCORBIC ACID) 500 MG tablet Take  500 mg by mouth daily.   Yes Historical Provider, MD   Allergies No Known Allergies  Family History Family History  Problem Relation Age of Onset  . Cancer Mother     breast  . Diabetes Father   . Colon cancer Neg Hx     Social History  reports that he has never smoked. He has never used smokeless tobacco. He reports that he does not drink alcohol or use illicit drugs.  Review Of Systems   Review of Systems  Constitutional: No weight loss, gain, + night sweats, subjective Fevers,- chills, + fatigue .  HEENT: No headaches, visual changes, Difficulty swallowing, Tooth/dental problems, or Sore throat,  No sneezing, itching, ear ache, nasal congestion, post nasal drip, no visual complaints CV:+   chest pain,-Orthopnea, -PND, -swelling in lower extremities, dizziness, palpitations, syncope.  GI No heartburn, indigestion, abdominal pain, nausea, vomiting, diarrhea, change in bowel habits, loss of appetite, bloody stools.  Resp:+ NP cough, No coughing up of blood. No change in color of mucus. No wheezing.  Skin: no rash or itching or icterus GU: no dysuria, change in color of urine, no urgency or frequency. No flank pain, no hematuria  MS: No joint pain or swelling. No decreased range of motion  Psych: No change in mood or affect. No depression or anxiety.  Neuro: no difficulty with speech, weakness, numbness, ataxia   Vital Signs: BP 105/66  Pulse 97  Temp 98.4 F (36.9 C) (Oral)  Resp 20  Ht 5\' 9"  (1.753 m)  Wt 91.037 kg (200 lb 11.2 oz)  BMI 29.64 kg/m2  SpO2 98%       . sodium chloride 125 mL/hr at 12/19/11 0118     Intake/Output Summary (Last 24 hours) at 12/19/11 1059 Last data filed at 12/19/11 1610  Gross per 24 hour  Intake  587.5 ml  Output   1725 ml  Net -1137.5 ml    Physical Examination: General:  Chronically ill appearing white male. Frustrated due to dyspnea,  Neuro:  Alert and oriented w/out focal def HEENT:  [place], no VJD Cardiovascular:  rrr Lungs:  Decreased on left Abdomen:  Non-tender Musculoskeletal:  intact Skin: intact   Ventilator settings:    Labs and Imaging:   Lab 12/19/11 0750 12/18/11 1832  NA 132* 136  K 4.0 4.6  CL 98 97  CO2 27 27  BUN 4* 5*  CREATININE 0.61 0.61  GLUCOSE 123* 133*    Lab 12/19/11 0750 12/18/11 1832  HGB 10.2* 11.5*  HCT 31.8* 35.7*  WBC 12.6* 14.5*  PLT 417* 500*    Lab 12/19/11 0750 12/18/11 1832  PROCALCITON -- --  WBC 12.6* 14.5*  LATICACIDVEN -- --     Assessment and Plan:  Large Left pleural effusion. Primary concern on diff dx is spread of  metastatic disease to the pleural space. Alternatively parapneumonia process is possible but not really convinced that he had/has PNA.    Recommendation/plan: -therapeutic/diagnostic left thoracentesis -would check PCT if negative would have very low threshold to stop abx  6 mm Lt upper lobe pulmonary nodule.  Incidental finding on CT chest 11/02/11. Recommendation/plan: -Will need outpt follow up  Metastatic colon cancer. Recommendation/plan: -Pt requesting referral to new oncologist>>defer decision to primary team  CAD. chronic systolic CHF, hyperlipidemia.  Followed by [facility] cardiology as outpt. Recommendation/plan: -per primary team  BABCOCK,PETE 12/19/2011, 10:59 AM  Reviewed above, examined pt, and agree with assessment/plan.  He has progressive, large left effusion in setting  of metastatic colon cancer.  Main concern is for malignancy.  Less likely fluid is related to infection or CHF.  Will proceed with diagnostic/therapeutic thoracentesis.  Coralyn Helling, MD [facility] Pulmonary/Critical Care 12/19/2011, 11:25 AM Pager:  [phone] After 3pm call: [phone]

## 2011-12-19 NOTE — Progress Notes (Signed)
Utilization Review Completed.  

## 2011-12-19 NOTE — Progress Notes (Signed)
Pt seen and examined Agree with Assessment and Plan as noted by Dr.Gardner Discussed with Dr.Sood, Re Thoracentesis today  Zannie Cove, MD [phone]

## 2011-12-19 NOTE — Progress Notes (Signed)
Pt has 16 beat run of V-tach, pt asymptomatic, MD Jomarie Longs aware, will monitor Worthy Flank, RN

## 2011-12-20 DIAGNOSIS — I509 Heart failure, unspecified: Secondary | ICD-10-CM

## 2011-12-20 DIAGNOSIS — J90 Pleural effusion, not elsewhere classified: Secondary | ICD-10-CM

## 2011-12-20 DIAGNOSIS — I5023 Acute on chronic systolic (congestive) heart failure: Secondary | ICD-10-CM

## 2011-12-20 DIAGNOSIS — R079 Chest pain, unspecified: Secondary | ICD-10-CM

## 2011-12-20 DIAGNOSIS — R911 Solitary pulmonary nodule: Secondary | ICD-10-CM

## 2011-12-20 DIAGNOSIS — C20 Malignant neoplasm of rectum: Secondary | ICD-10-CM

## 2011-12-20 LAB — LEGIONELLA ANTIGEN, URINE: Legionella Antigen, Urine: NEGATIVE

## 2011-12-20 MED ORDER — HYDROMORPHONE HCL PF 1 MG/ML IJ SOLN
1.0000 mg | INTRAMUSCULAR | Status: AC | PRN
  Administered 2011-12-20 – 2011-12-21 (×4): 1 mg via INTRAVENOUS
  Filled 2011-12-20 (×4): qty 1

## 2011-12-20 NOTE — Progress Notes (Addendum)
Name: Adam Henderson MRN: [id number] DOB: [birth year]    LOS: 2 Consulting MD:  Dr. Betti Cruz Reason for Consult:  Pleural Effusion Eval   History of Present Illness:  56 year old male w/ h/o advanced rectal cancer stage IIIb w/ mets to abd/peritoneum. He is s/p abd/peritoneal resection, completed chemotherapy in October 2012, recently identified as having progression of disease by pelvic mass bx on 9/3. Presented to ER on 9/16 w/ CC SOB, night sweats, NP cough and chest pain. Had been treated for several weeks prior to arrival for what was felt to be a CAP by his PCP. In spite of this therapy he has become progressively SOB w/ worsening CP. Dx eval in the ER demonstrated a large left pleural effuion. Pulmonary was asked to see for fluid evaluation and to assist w/ symptom management.    Subjective:  Patient indicates improvement in dyspnea but continued dry cough.  Episode of sharp pain last evening with neg cxr and no ST changes on EKG.  Continued intermittent pain in L chest with improvement with PRN pain med.  Tessalon pearls working for cough  Cultures / Cytology: BC x 2 9/16>>> Fungal / AFB 9/17>>> Pleural Culture>>> Pleural GS>>>rare wbc, no organisims Pleural Cytology>>>neg   Antibiotics: Cefepime 9/16 (?CAP)>>> levaquin 9/16>>>  Tests / Events: CT chest 9/16: Large left pleural effusion, compressive ATX, small pericardial effusion, coronary calcification, cardiomegaly    Vital Signs: BP 97/64  Pulse 92  Temp 98.2 F (36.8 C) (Oral)  Resp 20  Ht 5\' 9"  (1.753 m)  Wt 195 lb 8 oz (88.678 kg)  BMI 28.87 kg/m2  SpO2 99%  Intake/Output Summary (Last 24 hours) at 12/20/11 1311 Last data filed at 12/20/11 1140  Gross per 24 hour  Intake   1380 ml  Output   3870 ml  Net  -2490 ml    Physical Examination: General:  Chronically ill appearing white male. Frustrated due to dyspnea,  Neuro:  Alert and oriented w/out focal def HEENT:  [place], no VJD Cardiovascular:   rrr Lungs:  Decreased on left Abdomen:  Non-tender Musculoskeletal:  intact Skin: intact    Labs and Imaging:   Lab 12/19/11 0750 12/18/11 1832  NA 132* 136  K 4.0 4.6  CL 98 97  CO2 27 27  BUN 4* 5*  CREATININE 0.61 0.61  GLUCOSE 123* 133*    Lab 12/19/11 0750 12/18/11 1832  HGB 10.2* 11.5*  HCT 31.8* 35.7*  WBC 12.6* 14.5*  PLT 417* 500*    Lab 12/19/11 0750 12/18/11 1832  PROCALCITON -- --  WBC 12.6* 14.5*  LATICACIDVEN -- --     Assessment and Plan:  Large Left pleural effusion. Probable L parapneumonic effusion likely d/t PNA. Doubt malignant. Fluid is exudate, cytology was negative 9/17 LDH 161 / Glucose 137 / Protein 5.4  Plan: -repeat drainage of L effusion with IR and placement of pigtail catheter on L  6 mm Lt upper lobe pulmonary nodule.  Incidental finding on CT chest 11/02/11.  Plan: -Will need outpt follow up  Metastatic colon cancer.  Plan: -Pt requesting referral to new oncologist>>defer decision to primary team.    CAD.  Chronic systolic CHF Hyperlipidemia.    Plan: -per primary team -[facility] Following   Canary Brim, NP-C [facility] Pgr: [phone] or [phone]  I have seen and examined this pt and agree with above note.  Dorcas Carrow Beeper  [phone]  Cell  [phone]  If no response or cell goes to voicemail, call beeper [phone]

## 2011-12-20 NOTE — Consult Note (Signed)
Cardiology Consult Note   Patient ID: Adam Henderson MRN: [id number], DOB/AGE: [birth year]   Admit date: 12/18/2011 Date of Consult: 12/20/2011  Primary Physician: [name], MD Primary Cardiologist: Dr. Olga Millers  Reason for consult: evaluation/management of chest pain/NSVT  HPI:   [name] is a 56yo male with PMHx significant for CAD (s/p MI/cath 02/15/10 with thrombectomy/stent to OM2;  cath 10/2011 nonobstructive, patent OM2 stent), NICM (cardiomyopathy out of proportion to CAD; LVEF 30-35%, severe HK inferior, septal and posterior walls and moderate LA dilatation), colon cancer (s/p abd/perineal resection with colostomy 2012; CXT, RXT; recent presacral fluid aspiration revealing adenocarcinoma), lung nodule (LUL on 10/2011 CT) and hyperlipidemia who was admitted to [facility] with shortness of breath and chest pain.   As above, the patient has undergone thrombectomy + OM2 stenting in the setting of an MI in 2011. In 10/2011, he was admitted with chest pain. He underwent cardiac catheterization revealing single vessel CAD (OM1 30% stenosis), patent OM2 stent (20% proximal ISR) and LVEF 30-35%. Chest CT was ordered in the setting of an elevated D-dimer revealing no evidence of PE. Non-cardiac chest pain was suspected. He was diuresed due to A/C systolic CHF and discharged in stable condition with close follow-up. Medications were adjusted due to symptomatic hypotension (Coreg and Lasix). Late last month, he followed up again in the office where his weight had decreased by 9 lbs, but endorsed persistent fatigue, malaise and a new cough with pleuritic chest pain. The former symptoms were suspected to be secondary to dehydration and Lasix/K+ were stopped until further notice. ACEi/ARB/BB had been deferred due to hypotension. BMET at that time was unremarkabe. CXR revealed small L pleural effusion with left basilar airspace disease. Shortly after, a presacral fluid collection was  aspirated and returned with adenocarcinoma, but no organisms were found. He was advised to follow-up with his oncologist.   He reports experiencing progressive shortness of breath and left-sided chest pain, mostly associated with cough since that time. He also notes nonspecific substernal chest pressure and R-sided chest pain at times. He endorses increased fatigue, persistent nonproductive cough and nausea, but denies increased PND, orthopnea, LE edema, lightheadedness, diaphoresis, vomiting or syncope. He reports following up with his PCP, and started on antibiotics for concern of pneumonia; however, his symptoms persisted. He states his baseline weight is about 200-205 lbs, but has noted a decrease in this over the last few weeks. He denies fevers, chills, palpitations or active bleeding.   In the ED, EKG revealed sinus tachycardia, old lateral TWIs and nonspecific anterior ST changes. Initial trop-I returned WNL. CXR revealed a large left pleural effusion (new from prior radiograph) with LLL collapse. CT chest revealed no PE, but confirmed a large left pleural effusion with left lung atelectasis, cardiomegaly, calcific CAD and small pericardial effusion. CBC revealed a mild leukocytosis at 14.5, PLT 500, H/H 11.5/35.7. He was placed on empiric BS ABX. Pulm/CCM was consulted and performed a thoracentesis. Concern was for malignancy vs parapneumonic vs CHF (less likely). Fluid analysis- LDH returned at 161, TP 5.4, no organisms seen. He tolerated the procedure well. He did have a 16 beat run of asymptomatic NSVT last night, then experienced sharp, left sided chest pain associated with coughing last night relieved with dilaudid. STAT CXR revealed moderate L pleural effusion, otherwise unchanged. EKG revealed no acute ischemic changes.   He is currently comfortable, in NAD.   Problem List: Past Medical History  Diagnosis Date  . CAD (coronary artery disease)  a. s/p MI/Cath 02/15/10: thrombectomy and  stent in second obtuse marginal ;  b. 10/2011 Cath: patent OM stent, otw nonobs dzs -> med Rx.  Marland Kitchen Hyperlipidemia   . Blood in urine   . Cancer     a. colon- colonoscopy 7/10 no evidence of recurrent  . Ischemic cardiomyopathy     a. 11/02/2011 Echo: EF 30-35%, sev HK of inf, sept, post walls.  Sev dilated LV, mild MR, mod dil LA.  Marland Kitchen Chronic systolic CHF (congestive heart failure)   . Pulmonary nodule, left     a. CT 11/01/2011 6mm solitary pulmonary nodule ant LUL - f/u CT 6 months (04/2012)  . Cough     a. with pleuritic chest pain 11/2011    Past Surgical History  Procedure Date  . Proctoscopy   . Ureteral repair   . Cardiac stents   . Colon surgery     abdomeno perineal resection  . Colostomy   . Portacath placement      Allergies: No Known Allergies  Home Medications: Prior to Admission medications   Medication Sig Start Date End Date Taking? Authorizing Provider  amoxicillin-clavulanate (AUGMENTIN) 875-125 MG per tablet Take 1 tablet by mouth 2 (two) times daily. Started medication on Wednesday 12-13-11   Yes Historical Provider, MD  aspirin EC 81 MG tablet Take 81 mg by mouth daily.   Yes Historical Provider, MD  benzonatate (TESSALON) 100 MG capsule Take 100 mg by mouth 3 (three) times daily as needed. For cough.   Yes Historical Provider, MD  HYDROcodone-acetaminophen (NORCO/VICODIN) 5-325 MG per tablet Take 1 tablet by mouth every 4 (four) hours as needed. For pain. 11/22/11  Yes Historical Provider, MD  IRON PO Take 1 tablet by mouth daily.    Yes Historical Provider, MD  pantoprazole (PROTONIX) 40 MG tablet Take 40 mg by mouth daily.     Yes Historical Provider, MD  pravastatin (PRAVACHOL) 20 MG tablet Take 20 mg by mouth at bedtime.     Yes Historical Provider, MD  prochlorperazine (COMPAZINE) 10 MG tablet Take 10 mg by mouth every 6 (six) hours as needed. nausea   Yes Historical Provider, MD  Tamsulosin HCl (FLOMAX) 0.4 MG CAPS Take 0.4 mg by mouth daily.  08/11/11  Yes  Historical Provider, MD  vitamin B-12 (CYANOCOBALAMIN) 1000 MCG tablet Take 1,000 mcg by mouth daily.    Yes Historical Provider, MD  vitamin C (ASCORBIC ACID) 500 MG tablet Take 500 mg by mouth daily.   Yes Historical Provider, MD    Inpatient Medications:     . ceFEPime (MAXIPIME) IV  1 g Intravenous Q8H  . levofloxacin (LEVAQUIN) IV  750 mg Intravenous Q24H  . pantoprazole  40 mg Oral Daily  . simvastatin  10 mg Oral QHS  . sodium chloride  3 mL Intravenous Q12H  . vancomycin  1,000 mg Intravenous Q8H  . vitamin B-12  1,000 mcg Oral Daily  . vitamin C  500 mg Oral Daily  . DISCONTD: simvastatin  10 mg Oral q1800   Prescriptions prior to admission  Medication Sig Dispense Refill  . amoxicillin-clavulanate (AUGMENTIN) 875-125 MG per tablet Take 1 tablet by mouth 2 (two) times daily. Started medication on Wednesday 12-13-11      . aspirin EC 81 MG tablet Take 81 mg by mouth daily.      . benzonatate (TESSALON) 100 MG capsule Take 100 mg by mouth 3 (three) times daily as needed. For cough.      Marland Kitchen  HYDROcodone-acetaminophen (NORCO/VICODIN) 5-325 MG per tablet Take 1 tablet by mouth every 4 (four) hours as needed. For pain.      . IRON PO Take 1 tablet by mouth daily.       . pantoprazole (PROTONIX) 40 MG tablet Take 40 mg by mouth daily.        . pravastatin (PRAVACHOL) 20 MG tablet Take 20 mg by mouth at bedtime.        . prochlorperazine (COMPAZINE) 10 MG tablet Take 10 mg by mouth every 6 (six) hours as needed. nausea      . Tamsulosin HCl (FLOMAX) 0.4 MG CAPS Take 0.4 mg by mouth daily.       . vitamin B-12 (CYANOCOBALAMIN) 1000 MCG tablet Take 1,000 mcg by mouth daily.       . vitamin C (ASCORBIC ACID) 500 MG tablet Take 500 mg by mouth daily.        Family History  Problem Relation Age of Onset  . Cancer Mother     breast  . Diabetes Father   . Colon cancer Neg Hx      History   Social History  . Marital Status: Single    Spouse Name: N/A    Number of Children: N/A    . Years of Education: N/A   Occupational History  . Not on file.   Social History Main Topics  . Smoking status: Never Smoker   . Smokeless tobacco: Never Used  . Alcohol Use: No  . Drug Use: No  . Sexually Active: Not on file   Other Topics Concern  . Not on file   Social History Narrative  . No narrative on file     Review of Systems: General: positive for night sweats and weight loss, negative for chills, fever  Cardiovascular: positive for chest pain and shortness of breath, negative for edema, orthopnea, palpitations, paroxysmal nocturnal dyspnea or shortness of breath Dermatological: negative for rash Respiratory: positive for cough, negative for wheezing Urologic: negative for hematuria Abdominal: positive for nausea, negative vomiting, diarrhea, bright red blood per rectum, melena, or hematemesis Neurologic: negative for visual changes, syncope, or dizziness All other systems reviewed and are otherwise negative except as noted above.  Physical Exam: Blood pressure 97/64, pulse 92, temperature 98.2 F (36.8 C), temperature source Oral, resp. rate 20, height 5\' 9"  (1.753 m), weight 88.678 kg (195 lb 8 oz), SpO2 99.00%.    General: Appears older than stated age, pallor appreciated, in no acute distress. Head: Normocephalic, atraumatic, sclera non-icteric, no xanthomas, nares are without discharge.  Neck: Negative for carotid bruits. JVD not elevated. Lungs: Decreased BS to left lower lung fields, otherwise no appreciable wheezes, rales or rhonchi. Breathing is unlabored. Heart: RRR with S1 S2. No murmurs, rubs, or gallops appreciated. Abdomen: Soft, non-tender, non-distended with normoactive bowel sounds. No hepatomegaly. No rebound/guarding. No obvious abdominal masses. Msk:  Strength and tone appears decreased for age. Extremities: No clubbing, cyanosis or edema.  Distal pedal pulses are 2+ and equal bilaterally. Neuro: Alert and oriented X 3. Moves all extremities  spontaneously. Psych:  Responds to questions appropriately with a normal affect.  Labs: Recent Labs  [facility] 12/19/11 0750 12/18/11 1832   WBC 12.6* 14.5*   HGB 10.2* 11.5*   HCT 31.8* 35.7*   MCV 82.0 83.0   PLT 417* 500*   Lab 12/19/11 0750 12/18/11 1832  NA 132* 136  K 4.0 4.6  CL 98 97  CO2 27 27  BUN 4*  5*  CREATININE 0.61 0.61  CALCIUM 8.6 9.4  PROT -- 8.3  BILITOT -- 0.3  ALKPHOS -- 77  ALT -- 5  AST -- 12  AMYLASE -- --  LIPASE -- --  GLUCOSE 123* 133*   Recent Labs  Basename 12/18/11 1841   CKTOTAL --   CKMB --   CKMBINDEX --   TROPONINI <0.30   Radiology/Studies: Dg Chest 2 View  12/18/2011  *RADIOLOGY REPORT*  Clinical Data: Short of breath and pneumonia  CHEST - 2 VIEW  Comparison: 11/29/2011  Findings: Large left effusion has progressed from the  prior study. There is collapse of the left lower lobe.  Right lung is clear. Port-A-Cath tip remains in the lower SVC.  No heart failure.  IMPRESSION: Large left effusion has progressed from the prior study.  There is collapse of the left lower lobe.  Thoracentesis is suggested to rule out underlying malignancy or infection.   Original Report Authenticated By: Camelia Phenes, M.D.    Dg Chest 2 View  11/29/2011  *RADIOLOGY REPORT*  Clinical Data: Cough, shortness of breath and chest pain.  CHEST - 2 VIEW  Comparison: CT chest 11/02/2011 and chest radiograph 11/01/2011.  Findings: Trachea is midline.  Heart size stable.  Right IJ power port tip projects over the SVC.  Small left pleural effusion and left basilar airspace disease are new.  IMPRESSION: Small left pleural effusion with left basilar airspace disease.   Original Report Authenticated By: Reyes Ivan, M.D.    Ct Angio Chest Pe W/cm &/or Wo Cm  12/18/2011  *RADIOLOGY REPORT*  Clinical Data: Chest pain and shortness of breath.  History of colon cancer.  CT ANGIOGRAPHY CHEST  Technique:  Multidetector CT imaging of the chest using the standard protocol  during bolus administration of intravenous contrast. Multiplanar reconstructed images including MIPs were obtained and reviewed to evaluate the vascular anatomy.  Contrast: OMNIPAQUE IOHEXOL 350 MG/ML SOLN  Comparison: PA and lateral chest earlier this same day and CT chest 11/02/2011.  Findings: No pulmonary embolus is identified.  Since the prior CT scan, the small left pleural effusion seen on the prior study has increased in size and now nearly fills the left chest.  Trace amount of pleural fluid seen on the prior study has resolved. There is a small pericardial effusion, increased in size since the prior study.  Cardiomegaly is noted.  There are some small mediastinal lymph nodes but no pathologic lymphadenopathy by CT size criteria in the axilla, hila or mediastinum is identified. Coronary atherosclerotic vascular disease is noted.  The lungs demonstrate mark compressive atelectasis of the left lung.  The right lung is clear.  Incidentally imaged upper abdomen is unremarkable.  No focal bony abnormality is identified.  IMPRESSION:  1.  Negative for pulmonary embolus. 2.  Marked increase in a left pleural effusion which now nearly fills the left chest.  Associated compressive atelectasis of the left lung is seen. 3.  New very small pericardial effusion. 4.  Calcific coronary artery disease.  Cardiomegaly also noted.   Original Report Authenticated By: Bernadene Bell. Maricela Curet, M.D.    Ct Abdomen Pelvis W Contrast  12/05/2011  *RADIOLOGY REPORT*  Clinical Data: Rectal carcinoma.  Lower abdominal pain.  Pelvic fluid collection.  CT ABDOMEN AND PELVIS WITH CONTRAST  Technique:  Multidetector CT imaging of the abdomen and pelvis was performed following the standard protocol during bolus administration of intravenous contrast.  Contrast: 80mL OMNIPAQUE IOHEXOL 300 MG/ML  SOLN  Comparison: 10/09/2011  Findings: Images through the lung bases show a moderate left-sided pleural effusion and left lower lobe  atelectasis.  Hepatic cirrhosis is again noted, however no liver masses are identified.  The other abdominal parenchymal organs are normal in appearance.  Gallbladder is unremarkable.  No evidence of hydronephrosis.  A large partially necrotic soft tissue mass is again seen involving the rectum, with extension to involve the left pelvic sidewall and base of the urinary bladder.  This measures approximately 8.6 x 10.4 cm maximum dimensions, without significant change in size since prior study.  Asymmetric diffuse wall thickening of the urinary bladder is also stable in appearance, which may be due to tumor, muscular hypertrophy, or cystitis.  Shotty less than 1 cm lymph nodes are again seen in the central sigmoid mesentery and right lower quadrant small bowel mesentery which are stable.  Shotty less than 1 cm retroperitoneal lymph nodes in the aortocaval space are also stable.  No new or worsening areas of lymphadenopathy identified.  No evidence of inflammatory process or abnormal fluid collections. No evidence of bowel obstruction.  A left lower quadrant colostomy is again demonstrated.  No suspicious bone lesions identified.  IMPRESSION:  1.  No significant change in size of large, partially necrotic rectal mass, with involvement of the left pelvic side wall and base of the urinary bladder. 2.  Stable shotty sigmoid and small bowel mesenteric lymphadenopathy, and retroperitoneal lymphadenopathy. 3.  Increased moderate left pleural effusion and left lower lobe atelectasis. 4.  Stable hepatic cirrhosis.  No evidence of hepatic neoplasm, ascites, or abscess.   Original Report Authenticated By: Danae Orleans, M.D.    Ct Aspiration  12/05/2011  *RADIOLOGY REPORT*  CT GUIDED ASPIRATION  Date: 12/05/2011  Clinical History: 56 year old male with a history of advanced rectal cancer status post APR and adjuvant CTX with a persistent fluid and gas collection in the surgical bed concerning for recurrent necrotic tumor  versus abscess. The CEA level has been rising.  Procedures Performed: 1. CT guided aspiration  Interventional Radiologist:  Sterling Big, MD  Sedation: Moderate (conscious) sedation was used.  1.5 mg Versed, 50 mcg Fentanyl were administered intravenously.  The patient's vital signs were monitored continuously by radiology nursing throughout the procedure.  Sedation Time: 22 minutes  Fluoroscopy time: 10 seconds  PROCEDURE/FINDINGS:   Informed consent was obtained from the patient following explanation of the procedure, risks, benefits and alternatives. The patient understands, agrees and consents for the procedure. All questions were addressed. A time out was performed.  Maximal barrier sterile technique utilized including caps, mask, sterile gowns, sterile gloves, large sterile drape, hand hygiene, and betadine skin prep.  A planning axial CT scan was performed.  An appropriate skin entry site was selected and marked.  Local anesthesia was obtained with infiltration of 1% lidocaine.  Under CT fluoroscopic guidance, an 18 gauge trocar needle was advanced to the skin and soft tissues into the fluid and gas collection in the deep anatomic pelvis.  A very small amount (8 ml) of bloody fluid containing chunks of caseous material was successfully aspirated.  This material was divided and sent to microbiology for culture, as well as pathology for pathologic analysis.  The needle was manipulated in multiple directions in an effort to aspirate additional material.  This was not successful.  Location of the needle was confirmed with axial CT imaging.  Given the imaging findings, and lack of purulent return, it is felt that this lesion represents a  centrally necrotic recurrent tumor. Therefore, the needle was withdrawn.  No abscess drain was placed. The patient tolerated the procedure well, there was no immediate complication.  After appropriate observation period, the patient was discharged home.  IMPRESSION:   Technically successful CT guided aspiration of a small amount of bloody fluid and caseous material from within the deep pelvic necrotic mass.  The material was divided and sent to microbiology for culture, and pathology for pathologic evaluation.  No percutaneous drain was placed.  These results were called by telephone on 12/05/2011 at 03:45 p.m. to Dr. [name], who verbally acknowledged these results.  Signed,  Sterling Big, MD Vascular & Interventional Radiologist [facility] Radiology   Original Report Authenticated By: Alvino Blood Chest Port 1 View  12/19/2011  *RADIOLOGY REPORT*  Clinical Data: Left chest pain following thoracentesis.  PORTABLE CHEST - 1 VIEW  Comparison: Portable film earlier in the day.  Findings: Moderate left pleural effusion persists.  Compressive atelectasis left base.  Cardiomegaly.  No pneumothorax.  Right lung shows mild vascular congestion.  Unchanged Port-A-Cath. Unremarkable osseous structures.  IMPRESSION: Moderate left effusion persists.  There is no pneumothorax or significant change from earlier in the day.   Original Report Authenticated By: Elsie Stain, M.D.    Dg Chest Port 1 View  12/19/2011  *RADIOLOGY REPORT*  Clinical Data: Left thoracentesis follow-up.  PORTABLE CHEST - 1 VIEW  Comparison: 12/18/2011 CT  Findings: Decrease in size of the left pleural effusion. Hazy appearance to the left aerated portion of the lung may correspond to superimposed layering fluid in the pleural space or airspace edema.  No pneumothorax visualized.  Cardiomegaly.  Right Port-A- Cath with tip projecting over the cavoatrial junction.  No interval osseous change.  IMPRESSION: Decreased size of the left pleural effusion status post thoracentesis.  Hazy appearance to the left lung may reflect edema or superimposed layering pleural fluid.  No pneumothorax.   Original Report Authenticated By: Waneta Martins, M.D.     EKG: NSR, 92 bpm, TWIs V4-V6, nonspecific ST changes V1, V2,  occasional PAC/PVC  ASSESSMENT:   1. Chest pain 2. Shortness of breath 3. Pleural effusion  4. Hypotension 5. Non-ischemic cardiomyopathy 6. Nonobstructive CAD 7. NSVT  DISCUSSION/PLAN:   The patient's chest pain is likely pleuritic in the setting of chronic nonproductive cough, compressive atelectasis secondary to L pleural effusion and recent thoracentesis. This was relieved by dilaudid. Very low-suspicion for cardiac etiology given recent nonobstructive cardiac catheterization. Shortness of breath is likely secondary to the large left pleural effusion which has developed acutely, and likely driven by malignancy. NSVT is not unexpected given his underlying NICM, structural cardiac disease, and is a marker of mortality. The patient's heart failure regimen has been limited by his hypotension. Would continue to hold off on ACEi/BB therapy as this would potentiate further hypotension and he may not derive the mortality benefit of these medications in the setting of metastatic colon cancer. He is relatively euvolemic on exam today. Additionally, he is not a candidate for ICD implantation. Will defer further management to pulm/CCM and the primary team. We appreciate the consultation. Please contact if we can be of further assistance.   Signed, R. Hurman Horn, PA-C 12/20/2011, 11:35 AM  The patient has metastatic adenocarcinoma. His symptoms appear to be largely related to that. Treatment goals from a cardiac perspective initially it would be symptoms and that would include diuretics. In the context of his prognosis for his cancer, as blood  pressure allows, carvedilol and ACE inhibitors which have long-term benefit for mortality and heart failure risk reduction the appropriate to consider but in the short term I think are less relevant.  We had a discussion regarding oncology care. He has been frustrated with the care here to date. I spoke with Dr. Betti Cruz who will discuss further with the patient  with her oncology will be consulted here alternative outpatient oncology referral will be made.  Please call us for further questions.

## 2011-12-20 NOTE — Progress Notes (Signed)
Pt complained of sudden onset left sided sharp chest pain rated at 9/10 around 1945. He stated this began when he hd a coughing spell. Pt. Assessed. VS taken. Pt said that CP feels sharper than the pain he felt on admission. Also stated he felt short of breath. EKG done. On-call physician notified. Orders for stat CXRAY, PRN dilaudid, and to instruct patient to splint left side when coughing due to thoracentesis done today. PRN dose of Dilaudid was given. Will continue to monitor. Vannessa Godown, Melida Quitter

## 2011-12-20 NOTE — Plan of Care (Signed)
Problem: Phase I Progression Outcomes Goal: Initial discharge plan identified Outcome: Completed/Met Date Met:  12/20/11 pts initial D/C plan is to return home with mother, goal met  Problem: Phase II Progression Outcomes Goal: Tolerating diet Outcome: Completed/Met Date Met:  12/20/11 Pt on heart healthy diet, no c/o n/v, pt has good intake, pt uses bathroom appropriately, goal met

## 2011-12-20 NOTE — Progress Notes (Addendum)
Subjective: Breathing better. Had chest pain with cough yesterday. No other specific complaints.  Objective: Vital signs in last 24 hours: Filed Vitals:   12/19/11 1949 12/19/11 2122 12/20/11 0639 12/20/11 0920  BP: 99/69 100/68 90/52 97/64   Pulse:  90 92 92  Temp:  98.3 F (36.8 C) 98.2 F (36.8 C)   TempSrc:  Oral Oral   Resp:  20 20   Height:      Weight:   88.678 kg (195 lb 8 oz)   SpO2: 98% 98% 99%    Weight change: -1.622 kg (-3 lb 9.2 oz)  Intake/Output Summary (Last 24 hours) at 12/20/11 1011 Last data filed at 12/20/11 0829  Gross per 24 hour  Intake   1140 ml  Output   3470 ml  Net  -2330 ml    Physical Exam: General: Awake, Oriented, No acute distress. HEENT: EOMI. Neck: Supple CV: S1 and S2 Lungs: Diminished breath sounds at the left lung base. Abdomen: Soft, Nontender, Nondistended, +bowel sounds. Ext: Good pulses. Trace edema.  Lab Results: Basic Metabolic Panel:  Lab 12/19/11 2130 12/18/11 1832  NA 132* 136  K 4.0 4.6  CL 98 97  CO2 27 27  GLUCOSE 123* 133*  BUN 4* 5*  CREATININE 0.61 0.61  CALCIUM 8.6 9.4  MG 2.1 --  PHOS -- --   Liver Function Tests:  Lab 12/18/11 1832  AST 12  ALT 5  ALKPHOS 77  BILITOT 0.3  PROT 8.3  ALBUMIN 2.7*   No results found for this basename: LIPASE:5,AMYLASE:5 in the last 168 hours No results found for this basename: AMMONIA:5 in the last 168 hours CBC:  Lab 12/19/11 0750 12/18/11 1832  WBC 12.6* 14.5*  NEUTROABS -- 11.7*  HGB 10.2* 11.5*  HCT 31.8* 35.7*  MCV 82.0 83.0  PLT 417* 500*   Cardiac Enzymes:  Lab 12/18/11 1841  CKTOTAL --  CKMB --  CKMBINDEX --  TROPONINI <0.30   BNP (last 3 results)  Basename 11/16/11 1150 11/01/11 1532  PROBNP 229.0* 1658.0*   CBG: No results found for this basename: GLUCAP:5 in the last 168 hours No results found for this basename: HGBA1C:5 in the last 72 hours Other Labs: No components found with this basename: POCBNP:3 No results found for this  basename: DDIMER:2 in the last 168 hours No results found for this basename: CHOL:2,HDL:2,LDLCALC:2,TRIG:2,CHOLHDL:2,LDLDIRECT:2 in the last 168 hours No results found for this basename: TSH,T4TOTAL,FREET3,T3FREE,FREET4,THYROIDAB in the last 168 hours No results found for this basename: VITAMINB12:2,FOLATE:2,FERRITIN:2,TIBC:2,IRON:2,RETICCTPCT:2 in the last 168 hours  Micro Results: Recent Results (from the past 240 hour(s))  CULTURE, BLOOD (ROUTINE X 2)     Status: Normal (Preliminary result)   Collection Time   12/18/11  8:54 PM      Component Value Range Status Comment   Specimen Description BLOOD ARM RIGHT   Final    Special Requests BOTTLES DRAWN AEROBIC AND ANAEROBIC 10CC   Final    Culture  Setup Time 12/19/2011 01:47   Final    Culture     Final    Value:        BLOOD CULTURE RECEIVED NO GROWTH TO DATE CULTURE WILL BE HELD FOR 5 DAYS BEFORE ISSUING A FINAL NEGATIVE REPORT   Report Status PENDING   Incomplete   CULTURE, BLOOD (ROUTINE X 2)     Status: Normal (Preliminary result)   Collection Time   12/18/11  9:08 PM      Component Value Range Status Comment  Specimen Description BLOOD HAND LEFT   Final    Special Requests BOTTLES DRAWN AEROBIC ONLY [facility]   Final    Culture  Setup Time 12/19/2011 01:47   Final    Culture     Final    Value:        BLOOD CULTURE RECEIVED NO GROWTH TO DATE CULTURE WILL BE HELD FOR 5 DAYS BEFORE ISSUING A FINAL NEGATIVE REPORT   Report Status PENDING   Incomplete   GRAM STAIN     Status: Normal   Collection Time   12/19/11 11:45 AM      Component Value Range Status Comment   Specimen Description PLEURAL FLUID LEFT   Final    Special Requests   Final    Gram Stain     Final    Value: RARE WBC PRESENT,BOTH PMN AND MONONUCLEAR     NO ORGANISMS SEEN   Report Status 12/19/2011 FINAL   Final   BODY FLUID CULTURE     Status: Normal (Preliminary result)   Collection Time   12/19/11 11:45 AM      Component Value Range Status Comment   Specimen  Description PLEURAL FLUID LEFT   Final    Special Requests   Final    Gram Stain     Final    Value: RARE WBC PRESENT,BOTH PMN AND MONONUCLEAR     NO ORGANISMS SEEN     Performed at [facility]   Culture PENDING   Incomplete    Report Status PENDING   Incomplete   CULTURE, EXPECTORATED SPUTUM-ASSESSMENT     Status: Normal   Collection Time   12/19/11  2:07 PM      Component Value Range Status Comment   Specimen Description SPUTUM   Final    Special Requests NONE   Final    Sputum evaluation     Final    Value: MICROSCOPIC FINDINGS SUGGEST THAT THIS SPECIMEN IS NOT REPRESENTATIVE OF LOWER RESPIRATORY SECRETIONS. PLEASE RECOLLECT.     CALLED TO RN J.SMITH AT 1533 BY L.PITT 12/19/11.   Report Status 12/19/2011 FINAL   Final     Studies/Results: Dg Chest 2 View  12/18/2011  *RADIOLOGY REPORT*  Clinical Data: Short of breath and pneumonia  CHEST - 2 VIEW  Comparison: 11/29/2011  Findings: Large left effusion has progressed from the  prior study. There is collapse of the left lower lobe.  Right lung is clear. Port-A-Cath tip remains in the lower SVC.  No heart failure.  IMPRESSION: Large left effusion has progressed from the prior study.  There is collapse of the left lower lobe.  Thoracentesis is suggested to rule out underlying malignancy or infection.   Original Report Authenticated By: Camelia Phenes, M.D.    Ct Angio Chest Pe W/cm &/or Wo Cm  12/18/2011  *RADIOLOGY REPORT*  Clinical Data: Chest pain and shortness of breath.  History of colon cancer.  CT ANGIOGRAPHY CHEST  Technique:  Multidetector CT imaging of the chest using the standard protocol during bolus administration of intravenous contrast. Multiplanar reconstructed images including MIPs were obtained and reviewed to evaluate the vascular anatomy.  Contrast: OMNIPAQUE IOHEXOL 350 MG/ML SOLN  Comparison: PA and lateral chest earlier this same day and CT chest 11/02/2011.  Findings: No pulmonary embolus is identified.   Since the prior CT scan, the small left pleural effusion seen on the prior study has increased in size and now nearly fills the left chest.  Trace amount of pleural fluid seen on the prior study has resolved. There is a small pericardial effusion, increased in size since the prior study.  Cardiomegaly is noted.  There are some small mediastinal lymph nodes but no pathologic lymphadenopathy by CT size criteria in the axilla, hila or mediastinum is identified. Coronary atherosclerotic vascular disease is noted.  The lungs demonstrate mark compressive atelectasis of the left lung.  The right lung is clear.  Incidentally imaged upper abdomen is unremarkable.  No focal bony abnormality is identified.  IMPRESSION:  1.  Negative for pulmonary embolus. 2.  Marked increase in a left pleural effusion which now nearly fills the left chest.  Associated compressive atelectasis of the left lung is seen. 3.  New very small pericardial effusion. 4.  Calcific coronary artery disease.  Cardiomegaly also noted.   Original Report Authenticated By: Bernadene Bell. Maricela Curet, M.D.    Dg Chest Port 1 View  12/19/2011  *RADIOLOGY REPORT*  Clinical Data: Left chest pain following thoracentesis.  PORTABLE CHEST - 1 VIEW  Comparison: Portable film earlier in the day.  Findings: Moderate left pleural effusion persists.  Compressive atelectasis left base.  Cardiomegaly.  No pneumothorax.  Right lung shows mild vascular congestion.  Unchanged Port-A-Cath. Unremarkable osseous structures.  IMPRESSION: Moderate left effusion persists.  There is no pneumothorax or significant change from earlier in the day.   Original Report Authenticated By: Elsie Stain, M.D.    Dg Chest Port 1 View  12/19/2011  *RADIOLOGY REPORT*  Clinical Data: Left thoracentesis follow-up.  PORTABLE CHEST - 1 VIEW  Comparison: 12/18/2011 CT  Findings: Decrease in size of the left pleural effusion. Hazy appearance to the left aerated portion of the lung may correspond to  superimposed layering fluid in the pleural space or airspace edema.  No pneumothorax visualized.  Cardiomegaly.  Right Port-A- Cath with tip projecting over the cavoatrial junction.  No interval osseous change.  IMPRESSION: Decreased size of the left pleural effusion status post thoracentesis.  Hazy appearance to the left lung may reflect edema or superimposed layering pleural fluid.  No pneumothorax.   Original Report Authenticated By: Waneta Martins, M.D.     Medications: I have reviewed the patient's current medications. Scheduled Meds:   . ceFEPime (MAXIPIME) IV  1 g Intravenous Q8H  . levofloxacin (LEVAQUIN) IV  750 mg Intravenous Q24H  . pantoprazole  40 mg Oral Daily  . simvastatin  10 mg Oral QHS  . sodium chloride  3 mL Intravenous Q12H  . vancomycin  1,000 mg Intravenous Q8H  . vitamin B-12  1,000 mcg Oral Daily  . vitamin C  500 mg Oral Daily  . DISCONTD: simvastatin  10 mg Oral q1800   Continuous Infusions:   . sodium chloride 125 mL/hr at 12/19/11 2302   PRN Meds:.albuterol, benzonatate, HYDROcodone-acetaminophen, HYDROmorphone (DILAUDID) injection, prochlorperazine, sodium chloride  Assessment/Plan: Pleural effusion Fluid is exudative, cytology was negative for any malignant cells, had thoracentesis on 12/19/2011.  Appreciate pulmonary input. Per pulmonary, left effusion likely due to pneumonia.  Continue Vancomycin, cefepime and levofloxacin, antibiotics are 12/19/2011. CT angiogram on 12/18/2011, negative for pulmonary embolism,Marked increase in left pleural effusion, new small pericardial effusion. Pulmonary planning on placing a pigtail catheter. AFB cultures negative for AFB so far, body fluid culture negative so far, fungal culture negative so far, blood cultures x2 no growth to date.  Nonsustained ventricular tachycardia Patient is not a candidate for ICD implantation.  No further workup per cardiology.  Appreciate  their input.  Hypotension Stable.  Not on any  antihypertensive medications.  Leukocytosis Improve.  Continue to monitor.  Hyponatremia Continue to monitor.  Maroon-colored stools Hemoglobin stable.  Continue to monitor. Stool occult pending.  Chest pain Pleuritic, likely due to cough.  Stable.  Rectal cancer stage IIIB with metastases to abdomen/peritoneum Completed chemotherapy in October of 2012, patient currently being followed by Oncologist in [place], [place]. He would like to establish with oncology in [place], [place]. Patient scheduled an appointment with Dr. Mariel Sleet on 01/01/2012 at 11:30 AM.  6 mm Lt upper lobe pulmonary nodule Incidental finding on CT chest 11/02/11. Will need outpatient followup.  Prophylaxis SCDs.  Disposition Pending.   LOS: 2 days  Kemet Nijjar A, MD 12/20/2011, 10:11 AM

## 2011-12-21 ENCOUNTER — Encounter (HOSPITAL_COMMUNITY): Payer: Self-pay | Admitting: Radiology

## 2011-12-21 ENCOUNTER — Inpatient Hospital Stay (HOSPITAL_COMMUNITY): Payer: BC Managed Care – PPO

## 2011-12-21 DIAGNOSIS — D509 Iron deficiency anemia, unspecified: Secondary | ICD-10-CM

## 2011-12-21 LAB — CBC
HCT: 32 % — ABNORMAL LOW (ref 39.0–52.0)
Hemoglobin: 10.2 g/dL — ABNORMAL LOW (ref 13.0–17.0)
MCH: 26.2 pg (ref 26.0–34.0)
MCHC: 31.9 g/dL (ref 30.0–36.0)
MCV: 82.1 fL (ref 78.0–100.0)
RBC: 3.9 MIL/uL — ABNORMAL LOW (ref 4.22–5.81)

## 2011-12-21 LAB — BASIC METABOLIC PANEL
BUN: 6 mg/dL (ref 6–23)
CO2: 26 mEq/L (ref 19–32)
Glucose, Bld: 117 mg/dL — ABNORMAL HIGH (ref 70–99)
Potassium: 4.1 mEq/L (ref 3.5–5.1)
Sodium: 132 mEq/L — ABNORMAL LOW (ref 135–145)

## 2011-12-21 LAB — PROCALCITONIN: Procalcitonin: <0.1 ng/mL

## 2011-12-21 MED ORDER — FENTANYL CITRATE 0.05 MG/ML IJ SOLN
INTRAMUSCULAR | Status: AC | PRN
  Administered 2011-12-21 (×2): 50 ug via INTRAVENOUS

## 2011-12-21 MED ORDER — ASPIRIN EC 81 MG PO TBEC
81.0000 mg | DELAYED_RELEASE_TABLET | Freq: Every day | ORAL | Status: DC
  Administered 2011-12-22 – 2011-12-28 (×7): 81 mg via ORAL
  Filled 2011-12-21 (×7): qty 1

## 2011-12-21 MED ORDER — MIDAZOLAM HCL 2 MG/2ML IJ SOLN
INTRAMUSCULAR | Status: AC
  Filled 2011-12-21: qty 4

## 2011-12-21 MED ORDER — LEVOFLOXACIN 750 MG PO TABS
750.0000 mg | ORAL_TABLET | Freq: Every day | ORAL | Status: DC
  Administered 2011-12-21 – 2011-12-26 (×6): 750 mg via ORAL
  Filled 2011-12-21 (×7): qty 1

## 2011-12-21 MED ORDER — GUAIFENESIN-DM 100-10 MG/5ML PO SYRP: 5.0000 mL | ORAL_SOLUTION | Freq: Four times a day (QID) | ORAL | Status: DC | PRN

## 2011-12-21 MED ORDER — MIDAZOLAM HCL 5 MG/5ML IJ SOLN
INTRAMUSCULAR | Status: AC | PRN
  Administered 2011-12-21 (×2): 1 mg via INTRAVENOUS

## 2011-12-21 MED ORDER — FENTANYL CITRATE 0.05 MG/ML IJ SOLN
INTRAMUSCULAR | Status: AC
  Filled 2011-12-21: qty 4

## 2011-12-21 MED ORDER — SODIUM CHLORIDE 0.9 % IV SOLN
INTRAVENOUS | Status: AC
  Administered 2011-12-21 (×2): via INTRAVENOUS

## 2011-12-21 NOTE — Progress Notes (Signed)
Subjective: Breathing better.  Again had cough during the night. No specific complaints.  Objective: Vital signs in last 24 hours: Filed Vitals:   12/20/11 1500 12/20/11 2220 12/21/11 0606 12/21/11 0901  BP:  98/64 97/64 101/68  Pulse:  97 84 91  Temp:  98.1 F (36.7 C) 97.9 F (36.6 C)   TempSrc:  Oral Oral   Resp: 20 16 20    Height:      Weight:   88.7 kg (195 lb 8.8 oz)   SpO2: 99% 96% 95%    Weight change: 0.022 kg (0.8 oz)  Intake/Output Summary (Last 24 hours) at 12/21/11 1122 Last data filed at 12/21/11 0900  Gross per 24 hour  Intake    240 ml  Output   3750 ml  Net  -3510 ml    Physical Exam: General: Awake, Oriented, No acute distress. HEENT: EOMI. Neck: Supple CV: S1 and S2 Lungs: Diminished breath sounds at the left lung base. Abdomen: Soft, Nontender, Nondistended, +bowel sounds. Ext: Good pulses. Trace edema.  Lab Results: Basic Metabolic Panel:  Lab 12/21/11 2952 12/19/11 0750 12/18/11 1832  NA 132* 132* 136  K 4.1 4.0 4.6  CL 99 98 97  CO2 26 27 27   GLUCOSE 117* 123* 133*  BUN 6 4* 5*  CREATININE 0.66 0.61 0.61  CALCIUM 8.7 8.6 9.4  MG -- 2.1 --  PHOS -- -- --   Liver Function Tests:  Lab 12/18/11 1832  AST 12  ALT 5  ALKPHOS 77  BILITOT 0.3  PROT 8.3  ALBUMIN 2.7*   No results found for this basename: LIPASE:5,AMYLASE:5 in the last 168 hours No results found for this basename: AMMONIA:5 in the last 168 hours CBC:  Lab 12/21/11 0500 12/19/11 0750 12/18/11 1832  WBC 10.4 12.6* 14.5*  NEUTROABS -- -- 11.7*  HGB 10.2* 10.2* 11.5*  HCT 32.0* 31.8* 35.7*  MCV 82.1 82.0 83.0  PLT 408* 417* 500*   Cardiac Enzymes:  Lab 12/18/11 1841  CKTOTAL --  CKMB --  CKMBINDEX --  TROPONINI <0.30   BNP (last 3 results)  Basename 11/16/11 1150 11/01/11 1532  PROBNP 229.0* 1658.0*   CBG: No results found for this basename: GLUCAP:5 in the last 168 hours No results found for this basename: HGBA1C:5 in the last 72 hours Other Labs: No  components found with this basename: POCBNP:3 No results found for this basename: DDIMER:2 in the last 168 hours No results found for this basename: CHOL:2,HDL:2,LDLCALC:2,TRIG:2,CHOLHDL:2,LDLDIRECT:2 in the last 168 hours No results found for this basename: TSH,T4TOTAL,FREET3,T3FREE,FREET4,THYROIDAB in the last 168 hours No results found for this basename: VITAMINB12:2,FOLATE:2,FERRITIN:2,TIBC:2,IRON:2,RETICCTPCT:2 in the last 168 hours  Micro Results: Recent Results (from the past 240 hour(s))  CULTURE, BLOOD (ROUTINE X 2)     Status: Normal (Preliminary result)   Collection Time   12/18/11  8:54 PM      Component Value Range Status Comment   Specimen Description BLOOD ARM RIGHT   Final    Special Requests BOTTLES DRAWN AEROBIC AND ANAEROBIC 10CC   Final    Culture  Setup Time 12/19/2011 01:47   Final    Culture     Final    Value:        BLOOD CULTURE RECEIVED NO GROWTH TO DATE CULTURE WILL BE HELD FOR 5 DAYS BEFORE ISSUING A FINAL NEGATIVE REPORT   Report Status PENDING   Incomplete   CULTURE, BLOOD (ROUTINE X 2)     Status: Normal (Preliminary result)   Collection  Time   12/18/11  9:08 PM      Component Value Range Status Comment   Specimen Description BLOOD HAND LEFT   Final    Special Requests BOTTLES DRAWN AEROBIC ONLY 6CC   Final    Culture  Setup Time 12/19/2011 01:47   Final    Culture     Final    Value:        BLOOD CULTURE RECEIVED NO GROWTH TO DATE CULTURE WILL BE HELD FOR 5 DAYS BEFORE ISSUING A FINAL NEGATIVE REPORT   Report Status PENDING   Incomplete   GRAM STAIN     Status: Normal   Collection Time   12/19/11 11:45 AM      Component Value Range Status Comment   Specimen Description PLEURAL FLUID LEFT   Final    Special Requests   Final    Gram Stain     Final    Value: RARE WBC PRESENT,BOTH PMN AND MONONUCLEAR     NO ORGANISMS SEEN   Report Status 12/19/2011 FINAL   Final   BODY FLUID CULTURE     Status: Normal (Preliminary result)   Collection Time    12/19/11 11:45 AM      Component Value Range Status Comment   Specimen Description PLEURAL FLUID LEFT   Final    Special Requests   Final    Gram Stain     Final    Value: RARE WBC PRESENT,BOTH PMN AND MONONUCLEAR     NO ORGANISMS SEEN     Performed at [facility]   Culture NO GROWTH 1 DAY   Final    Report Status PENDING   Incomplete   AFB CULTURE WITH SMEAR     Status: Normal (Preliminary result)   Collection Time   12/19/11 11:45 AM      Component Value Range Status Comment   Specimen Description PLEURAL FLUID LEFT   Final    Special Requests   Final    ACID FAST SMEAR NO ACID FAST BACILLI SEEN   Final    Culture     Final    Value: CULTURE WILL BE EXAMINED FOR 6 WEEKS BEFORE ISSUING A FINAL REPORT   Report Status PENDING   Incomplete   FUNGUS CULTURE W SMEAR     Status: Normal (Preliminary result)   Collection Time   12/19/11 11:45 AM      Component Value Range Status Comment   Specimen Description PLEURAL FLUID LEFT   Final    Special Requests   Final    Fungal Smear NO YEAST OR FUNGAL ELEMENTS SEEN   Final    Culture CULTURE IN PROGRESS FOR FOUR WEEKS   Final    Report Status PENDING   Incomplete   CULTURE, EXPECTORATED SPUTUM-ASSESSMENT     Status: Normal   Collection Time   12/19/11  2:07 PM      Component Value Range Status Comment   Specimen Description SPUTUM   Final    Special Requests NONE   Final    Sputum evaluation     Final    Value: MICROSCOPIC FINDINGS SUGGEST THAT THIS SPECIMEN IS NOT REPRESENTATIVE OF LOWER RESPIRATORY SECRETIONS. PLEASE RECOLLECT.     CALLED TO RN J.SMITH AT 1533 BY L.PITT 12/19/11.   Report Status 12/19/2011 FINAL   Final     Studies/Results: Dg Chest 2 View  12/21/2011  *RADIOLOGY REPORT*  Clinical Data: Follow up left pleural effusion  CHEST - 2 VIEW  Comparison: 12/19/2011  Findings: Right chest wall porta-catheter is identified with tip in the cavoatrial junction.  There is a large left pleural effusion which is  not significantly changed from previous exam.  Right lung is clear.  IMPRESSION:  1.  No change in volume of left pleural effusion.   Original Report Authenticated By: Rosealee Albee, M.D.    Dg Chest Port 1 View  12/19/2011  *RADIOLOGY REPORT*  Clinical Data: Left chest pain following thoracentesis.  PORTABLE CHEST - 1 VIEW  Comparison: Portable film earlier in the day.  Findings: Moderate left pleural effusion persists.  Compressive atelectasis left base.  Cardiomegaly.  No pneumothorax.  Right lung shows mild vascular congestion.  Unchanged Port-A-Cath. Unremarkable osseous structures.  IMPRESSION: Moderate left effusion persists.  There is no pneumothorax or significant change from earlier in the day.   Original Report Authenticated By: Elsie Stain, M.D.    Dg Chest Port 1 View  12/19/2011  *RADIOLOGY REPORT*  Clinical Data: Left thoracentesis follow-up.  PORTABLE CHEST - 1 VIEW  Comparison: 12/18/2011 CT  Findings: Decrease in size of the left pleural effusion. Hazy appearance to the left aerated portion of the lung may correspond to superimposed layering fluid in the pleural space or airspace edema.  No pneumothorax visualized.  Cardiomegaly.  Right Port-A- Cath with tip projecting over the cavoatrial junction.  No interval osseous change.  IMPRESSION: Decreased size of the left pleural effusion status post thoracentesis.  Hazy appearance to the left lung may reflect edema or superimposed layering pleural fluid.  No pneumothorax.   Original Report Authenticated By: Waneta Martins, M.D.     Medications: I have reviewed the patient's current medications. Scheduled Meds:    . ceFEPime (MAXIPIME) IV  1 g Intravenous Q8H  . levofloxacin (LEVAQUIN) IV  750 mg Intravenous Q24H  . pantoprazole  40 mg Oral Daily  . simvastatin  10 mg Oral QHS  . sodium chloride  3 mL Intravenous Q12H  . vancomycin  1,000 mg Intravenous Q8H  . vitamin B-12  1,000 mcg Oral Daily  . vitamin C  500 mg Oral  Daily   Continuous Infusions:    . sodium chloride 125 mL/hr at 12/20/11 1324   PRN Meds:.albuterol, benzonatate, HYDROcodone-acetaminophen, HYDROmorphone (DILAUDID) injection, HYDROmorphone (DILAUDID) injection, prochlorperazine, sodium chloride  Assessment/Plan: Pleural effusion Fluid is exudative, cytology was negative for any malignant cells, had thoracentesis on 12/19/2011.  Appreciate pulmonary input. Pleural effusion likely due to pneumonia.  Discontinue Vancomycin on 12/21/2011. Continue cefepime and levofloxacin, antibiotics are 12/19/2011. CT angiogram on 12/18/2011, negative for pulmonary embolism,Marked increase in left pleural effusion, new small pericardial effusion. Pulmonary planning on placing a pigtail catheter, plan on discharge after patient has pigtail catheter placed. AFB cultures negative for AFB so far, body fluid culture negative so far, fungal culture negative so far, blood cultures x2 no growth to date.  Nonsustained ventricular tachycardia Patient is not a candidate for ICD implantation.  No further workup per cardiology.  Appreciate their input.  Hypotension Stable.  Not on any antihypertensive medications.  Leukocytosis Improve.  Continue to monitor.  Hyponatremia Continue to monitor.  Maroon-colored stools Hemoglobin stable.  Continue to monitor. Stool occult pending.  Chest pain Pleuritic, likely due to cough.  Stable.  Rectal cancer stage IIIB with metastases to abdomen/peritoneum Completed chemotherapy in October of 2012, patient currently being followed by Oncologist in [place], [place]. He would like to establish with oncology in [place], [place]. Patient  scheduled an appointment with Dr. Mariel Sleet on 01/01/2012 at 11:30 AM.  6 mm Lt upper lobe pulmonary nodule Incidental finding on CT chest 11/02/11. Will need outpatient followup.  Hyperlipidemia Continue statin.  Coronary artery disease Resume aspirin  tomorrow.  Prophylaxis SCDs.  Disposition Pending, pulmonary workup.   LOS: 3 days  Lanard Arguijo A, MD 12/21/2011, 11:22 AM

## 2011-12-21 NOTE — Progress Notes (Signed)
Patient had a 15 beat run of V-tach while sleeping. Woke patient and asked if he was having any chest pains or SOB, he stated no. Asymptomatic and no signs or symptoms of distress. BP 91/66 and HR 96. MD notified. No new orders given. Will continue to monitor for further changes in condition.

## 2011-12-21 NOTE — H&P (Signed)
Adam Henderson is an 56 y.o. male.   Chief Complaint: large left pleural effusion Rectal ca; mets to peritoneum Effusion tapped 9/17 by Dr Craige Cotta: 1.5 liter fluid: neg Recurrent effusion Request for chest drain placement and thoracentesis HPI: CAD; HLD; rectal ca; productive cough  Past Medical History  Diagnosis Date  . CAD (coronary artery disease)     a. s/p MI/Cath 02/15/10: thrombectomy and stent in second obtuse marginal ;  b. 10/2011 Cath: patent OM stent, otw nonobs dzs -> med Rx.  Marland Kitchen Hyperlipidemia   . Blood in urine   . Cancer     a. colon- colonoscopy 7/10 no evidence of recurrent  . Ischemic cardiomyopathy     a. 11/02/2011 Echo: EF 30-35%, sev HK of inf, sept, post walls.  Sev dilated LV, mild MR, mod dil LA.  Marland Kitchen Chronic systolic CHF (congestive heart failure)   . Pulmonary nodule, left     a. CT 11/01/2011 6mm solitary pulmonary nodule ant LUL - f/u CT 6 months (04/2012)  . Cough     a. with pleuritic chest pain 11/2011    Past Surgical History  Procedure Date  . Proctoscopy   . Ureteral repair   . Cardiac stents   . Colon surgery     abdomeno perineal resection  . Colostomy   . Portacath placement     Family History  Problem Relation Age of Onset  . Cancer Mother     breast  . Diabetes Father   . Colon cancer Neg Hx    Social History:  reports that he has never smoked. He has never used smokeless tobacco. He reports that he does not drink alcohol or use illicit drugs.  Allergies: No Known Allergies  Medications Prior to Admission  Medication Sig Dispense Refill  . amoxicillin-clavulanate (AUGMENTIN) 875-125 MG per tablet Take 1 tablet by mouth 2 (two) times daily. Started medication on Wednesday 12-13-11      . aspirin EC 81 MG tablet Take 81 mg by mouth daily.      . benzonatate (TESSALON) 100 MG capsule Take 100 mg by mouth 3 (three) times daily as needed. For cough.      Marland Kitchen HYDROcodone-acetaminophen (NORCO/VICODIN) 5-325 MG per tablet Take 1 tablet by mouth  every 4 (four) hours as needed. For pain.      . IRON PO Take 1 tablet by mouth daily.       . pantoprazole (PROTONIX) 40 MG tablet Take 40 mg by mouth daily.        . pravastatin (PRAVACHOL) 20 MG tablet Take 20 mg by mouth at bedtime.        . prochlorperazine (COMPAZINE) 10 MG tablet Take 10 mg by mouth every 6 (six) hours as needed. nausea      . Tamsulosin HCl (FLOMAX) 0.4 MG CAPS Take 0.4 mg by mouth daily.       . vitamin B-12 (CYANOCOBALAMIN) 1000 MCG tablet Take 1,000 mcg by mouth daily.       . vitamin C (ASCORBIC ACID) 500 MG tablet Take 500 mg by mouth daily.        Results for orders placed during the hospital encounter of 12/18/11 (from the past 48 hour(s))  BASIC METABOLIC PANEL     Status: Abnormal   Collection Time   12/19/11  7:50 AM      Component Value Range Comment   Sodium 132 (*) 135 - 145 mEq/L    Potassium 4.0  3.5 - 5.1  mEq/L    Chloride 98  96 - 112 mEq/L    CO2 27  19 - 32 mEq/L    Glucose, Bld 123 (*) 70 - 99 mg/dL    BUN 4 (*) 6 - 23 mg/dL    Creatinine, Ser 9.52  0.50 - 1.35 mg/dL    Calcium 8.6  8.4 - 84.1 mg/dL    GFR calc non Af Amer >90  >90 mL/min    GFR calc Af Amer >90  >90 mL/min   CBC     Status: Abnormal   Collection Time   12/19/11  7:50 AM      Component Value Range Comment   WBC 12.6 (*) 4.0 - 10.5 K/uL    RBC 3.88 (*) 4.22 - 5.81 MIL/uL    Hemoglobin 10.2 (*) 13.0 - 17.0 g/dL    HCT 32.4 (*) 40.1 - 52.0 %    MCV 82.0  78.0 - 100.0 fL    MCH 26.3  26.0 - 34.0 pg    MCHC 32.1  30.0 - 36.0 g/dL    RDW 02.7  25.3 - 66.4 %    Platelets 417 (*) 150 - 400 K/uL   HIV ANTIBODY (ROUTINE TESTING)     Status: Normal   Collection Time   12/19/11  7:50 AM      Component Value Range Comment   HIV NON REACTIVE  NON REACTIVE   MAGNESIUM     Status: Normal   Collection Time   12/19/11  7:50 AM      Component Value Range Comment   Magnesium 2.1  1.5 - 2.5 mg/dL   LEGIONELLA ANTIGEN, URINE     Status: Normal   Collection Time   12/19/11  9:47 AM        Component Value Range Comment   Specimen Description URINE, CLEAN CATCH      Special Requests NONE      Legionella Antigen, Urine Negative for Legionella pneumophilia serogroup 1      Report Status 12/20/2011 FINAL     STREP PNEUMONIAE URINARY ANTIGEN     Status: Normal   Collection Time   12/19/11  9:47 AM      Component Value Range Comment   Strep Pneumo Urinary Antigen NEGATIVE  NEGATIVE   GRAM STAIN     Status: Normal   Collection Time   12/19/11 11:45 AM      Component Value Range Comment   Specimen Description PLEURAL FLUID LEFT      Special Requests      Gram Stain        Value: RARE WBC PRESENT,BOTH PMN AND MONONUCLEAR     NO ORGANISMS SEEN   Report Status 12/19/2011 FINAL     BODY FLUID CELL COUNT WITH DIFFERENTIAL     Status: Abnormal   Collection Time   12/19/11 11:45 AM      Component Value Range Comment   Fluid Type-FCT PLEURAL      Color, Fluid YELLOW  YELLOW    Appearance, Fluid HAZY (*) CLEAR    WBC, Fluid 537  0 - 1000 cu mm    Neutrophil Count, Fluid 60 (*) 0 - 25 %    Lymphs, Fluid 13      Monocyte-Macrophage-Serous Fluid 24 (*) 50 - 90 %    Eos, Fluid 3      Other Cells, Fluid MESOTHELIAL CELLS PRESENT     BODY FLUID CULTURE     Status: Normal (Preliminary result)  Collection Time   12/19/11 11:45 AM      Component Value Range Comment   Specimen Description PLEURAL FLUID LEFT      Special Requests      Gram Stain        Value: RARE WBC PRESENT,BOTH PMN AND MONONUCLEAR     NO ORGANISMS SEEN     Performed at [facility]   Culture NO GROWTH 1 DAY      Report Status PENDING     GLUCOSE, SEROUS FLUID     Status: Normal   Collection Time   12/19/11 11:45 AM      Component Value Range Comment   Glucose, Fluid 137      Fluid Type-FGLU PLEURAL     LACTATE DEHYDROGENASE, BODY FLUID     Status: Abnormal   Collection Time   12/19/11 11:45 AM      Component Value Range Comment   LD, Fluid 161 (*) 3 - 23 U/L    Fluid Type-FLDH PLEURAL      PROTEIN, BODY FLUID     Status: Normal   Collection Time   12/19/11 11:45 AM      Component Value Range Comment   Total protein, fluid 5.4   NO NORMAL RANGE ESTABLISHED FOR THIS TEST   Fluid Type-FTP PLEURAL     AFB CULTURE WITH SMEAR     Status: Normal (Preliminary result)   Collection Time   12/19/11 11:45 AM      Component Value Range Comment   Specimen Description PLEURAL FLUID LEFT      Special Requests      ACID FAST SMEAR NO ACID FAST BACILLI SEEN      Culture        Value: CULTURE WILL BE EXAMINED FOR 6 WEEKS BEFORE ISSUING A FINAL REPORT   Report Status PENDING     FUNGUS CULTURE W SMEAR     Status: Normal (Preliminary result)   Collection Time   12/19/11 11:45 AM      Component Value Range Comment   Specimen Description PLEURAL FLUID LEFT      Special Requests      Fungal Smear NO YEAST OR FUNGAL ELEMENTS SEEN      Culture CULTURE IN PROGRESS FOR FOUR WEEKS      Report Status PENDING     CULTURE, EXPECTORATED SPUTUM-ASSESSMENT     Status: Normal   Collection Time   12/19/11  2:07 PM      Component Value Range Comment   Specimen Description SPUTUM      Special Requests NONE      Sputum evaluation        Value: MICROSCOPIC FINDINGS SUGGEST THAT THIS SPECIMEN IS NOT REPRESENTATIVE OF LOWER RESPIRATORY SECRETIONS. PLEASE RECOLLECT.     CALLED TO RN J.SMITH AT 1533 BY L.PITT 12/19/11.   Report Status 12/19/2011 FINAL     BASIC METABOLIC PANEL     Status: Abnormal   Collection Time   12/21/11  5:00 AM      Component Value Range Comment   Sodium 132 (*) 135 - 145 mEq/L    Potassium 4.1  3.5 - 5.1 mEq/L    Chloride 99  96 - 112 mEq/L    CO2 26  19 - 32 mEq/L    Glucose, Bld 117 (*) 70 - 99 mg/dL    BUN 6  6 - 23 mg/dL    Creatinine, Ser 6.96  0.50 - 1.35 mg/dL  Calcium 8.7  8.4 - 10.5 mg/dL    GFR calc non Af Amer >90  >90 mL/min    GFR calc Af Amer >90  >90 mL/min   CBC     Status: Abnormal   Collection Time   12/21/11  5:00 AM      Component Value Range  Comment   WBC 10.4  4.0 - 10.5 K/uL    RBC 3.90 (*) 4.22 - 5.81 MIL/uL    Hemoglobin 10.2 (*) 13.0 - 17.0 g/dL    HCT 45.4 (*) 09.8 - 52.0 %    MCV 82.1  78.0 - 100.0 fL    MCH 26.2  26.0 - 34.0 pg    MCHC 31.9  30.0 - 36.0 g/dL    RDW 11.9  14.7 - 82.9 %    Platelets 408 (*) 150 - 400 K/uL   PROTIME-INR     Status: Abnormal   Collection Time   12/21/11  5:00 AM      Component Value Range Comment   Prothrombin Time 18.4 (*) 11.6 - 15.2 seconds    INR 1.58 (*) 0.00 - 1.49   APTT     Status: Abnormal   Collection Time   12/21/11  5:00 AM      Component Value Range Comment   aPTT 39 (*) 24 - 37 seconds   PROCALCITONIN     Status: Normal   Collection Time   12/21/11  5:00 AM      Component Value Range Comment   Procalcitonin <0.10      Dg Chest Port 1 View  12/19/2011  *RADIOLOGY REPORT*  Clinical Data: Left chest pain following thoracentesis.  PORTABLE CHEST - 1 VIEW  Comparison: Portable film earlier in the day.  Findings: Moderate left pleural effusion persists.  Compressive atelectasis left base.  Cardiomegaly.  No pneumothorax.  Right lung shows mild vascular congestion.  Unchanged Port-A-Cath. Unremarkable osseous structures.  IMPRESSION: Moderate left effusion persists.  There is no pneumothorax or significant change from earlier in the day.   Original Report Authenticated By: Elsie Stain, M.D.    Dg Chest Port 1 View  12/19/2011  *RADIOLOGY REPORT*  Clinical Data: Left thoracentesis follow-up.  PORTABLE CHEST - 1 VIEW  Comparison: 12/18/2011 CT  Findings: Decrease in size of the left pleural effusion. Hazy appearance to the left aerated portion of the lung may correspond to superimposed layering fluid in the pleural space or airspace edema.  No pneumothorax visualized.  Cardiomegaly.  Right Port-A- Cath with tip projecting over the cavoatrial junction.  No interval osseous change.  IMPRESSION: Decreased size of the left pleural effusion status post thoracentesis.  Hazy appearance  to the left lung may reflect edema or superimposed layering pleural fluid.  No pneumothorax.   Original Report Authenticated By: Waneta Martins, M.D.     Review of Systems  Constitutional: Negative for fever.  Respiratory: Positive for cough, sputum production and shortness of breath.   Cardiovascular: Positive for chest pain.  Gastrointestinal: Negative for nausea, vomiting and abdominal pain.  Musculoskeletal: Positive for back pain.  Neurological: Positive for weakness and headaches.    Blood pressure 97/64, pulse 84, temperature 97.9 F (36.6 C), temperature source Oral, resp. rate 20, height 5\' 9"  (1.753 m), weight 195 lb 8.8 oz (88.7 kg), SpO2 95.00%. Physical Exam  Constitutional: He is oriented to person, place, and time. He appears well-developed.  Cardiovascular: Normal rate and regular rhythm.   No murmur heard. Respiratory: Effort normal.  He has no wheezes. He has rales.  GI: Soft. Bowel sounds are normal. There is no tenderness.  Musculoskeletal: Normal range of motion.  Neurological: He is alert and oriented to person, place, and time.  Psychiatric: He has a normal mood and affect. His behavior is normal. Judgment and thought content normal.     Assessment/Plan Large left pleural eff; rectal ca - mets to peritoneum Effusion tapped 9/17: 1.5 L; neg Recurrent effusion Scheduled for thora with chest drain placement Pt aware of procedure benefits and risks and agreeable to proceed Consent signed and in chart  Cerissa Zeiger A 12/21/2011, 7:33 AM

## 2011-12-21 NOTE — Procedures (Signed)
L chest tube 16Fr 1.5 liters clear yellow fluid. No comp

## 2011-12-21 NOTE — ED Notes (Signed)
Chest tube has drained 2L of fluid; chest tube clamped per MD and new drainage collection  attatched

## 2011-12-21 NOTE — Progress Notes (Signed)
Name: Adam Henderson MRN: [id number] DOB: Mar 08, 1956    LOS: 3 Consulting MD:  Dr. Betti Cruz Reason for Consult:  Pleural Effusion Eval   History of Present Illness: 56 yo male admitted 12/18/2011 dyspnea, night sweats, dry cough and chest pain.  Had several weeks of outpt tx for PNA.  Found to have large Lt pleural effusion and PCCM consulted 9/17.  Has hx of Stage IIIb rectal cancer s/p resection and s/p completion of chemo October 2012.  Found to have pelvic recurrence 12/05/11.  PMHx CAD, Hyperlipidemia, Systolic CHF   Subjective:    Cultures / Cytology: BC x 2 9/16>>> Fungal / AFB 9/17>>> Pleural Culture>>> Pleural Cytology>>>reactive mesothelial cells, negative for malignancy  Antibiotics: Cefepime 9/16 (?CAP)>>> levaquin 9/16>>>  Tests / Events: CT chest 9/16>>Large left pleural effusion, compressive ATX, small pericardial effusion, coronary calcification, cardiomegaly Lt thoracentesis 9/17>>1500 ml fluid, protein 5.4, LDH 161, glucose 137, 537 WBC (60N, 13L, 13M, E3)   Vital Signs: BP 97/64  Pulse 84  Temp 97.9 F (36.6 C) (Oral)  Resp 20  Ht 5\' 9"  (1.753 m)  Wt 195 lb 8.8 oz (88.7 kg)  BMI 28.88 kg/m2  SpO2 95%  Intake/Output Summary (Last 24 hours) at 12/21/11 0815 Last data filed at 12/21/11 0610  Gross per 24 hour  Intake    240 ml  Output   4145 ml  Net  -3905 ml    Physical Examination: General:  Chronically ill appearing white male. Frustrated due to dyspnea,  Neuro:  Alert and oriented w/out focal def HEENT:  [place], no VJD Cardiovascular:  rrr Lungs:  Decreased on left Abdomen:  Non-tender Musculoskeletal:  intact Skin: intact    Labs and Imaging:   Lab 12/21/11 0500 12/19/11 0750 12/18/11 1832  NA 132* 132* 136  K 4.1 4.0 4.6  CL 99 98 97  CO2 26 27 27   BUN 6 4* 5*  CREATININE 0.66 0.61 0.61  GLUCOSE 117* 123* 133*    Lab 12/21/11 0500 12/19/11 0750 12/18/11 1832  HGB 10.2* 10.2* 11.5*  HCT 32.0* 31.8* 35.7*  WBC 10.4 12.6*  14.5*  PLT 408* 417* 500*    Lab 12/21/11 0500 12/19/11 0750 12/18/11 1832  PROCALCITON <0.10 -- --  WBC 10.4 12.6* 14.5*  LATICACIDVEN -- -- --    Dg Chest 2 View  12/21/2011  *RADIOLOGY REPORT*  Clinical Data: Follow up left pleural effusion  CHEST - 2 VIEW  Comparison: 12/19/2011  Findings: Right chest wall porta-catheter is identified with tip in the cavoatrial junction.  There is a large left pleural effusion which is not significantly changed from previous exam.  Right lung is clear.  IMPRESSION:  1.  No change in volume of left pleural effusion.   Original Report Authenticated By: Rosealee Albee, M.D.    Dg Chest Port 1 View  12/19/2011  *RADIOLOGY REPORT*  Clinical Data: Left chest pain following thoracentesis.  PORTABLE CHEST - 1 VIEW  Comparison: Portable film earlier in the day.  Findings: Moderate left pleural effusion persists.  Compressive atelectasis left base.  Cardiomegaly.  No pneumothorax.  Right lung shows mild vascular congestion.  Unchanged Port-A-Cath. Unremarkable osseous structures.  IMPRESSION: Moderate left effusion persists.  There is no pneumothorax or significant change from earlier in the day.   Original Report Authenticated By: Elsie Stain, M.D.    Dg Chest Port 1 View  12/19/2011  *RADIOLOGY REPORT*  Clinical Data: Left thoracentesis follow-up.  PORTABLE CHEST - 1  VIEW  Comparison: 12/18/2011 CT  Findings: Decrease in size of the left pleural effusion. Hazy appearance to the left aerated portion of the lung may correspond to superimposed layering fluid in the pleural space or airspace edema.  No pneumothorax visualized.  Cardiomegaly.  Right Port-A- Cath with tip projecting over the cavoatrial junction.  No interval osseous change.  IMPRESSION: Decreased size of the left pleural effusion status post thoracentesis.  Hazy appearance to the left lung may reflect edema or superimposed layering pleural fluid.  No pneumothorax.   Original Report Authenticated By:  Waneta Martins, M.D.     Assessment and Plan:  Large Left pleural effusion. Probable L parapneumonic effusion likely d/t PNA. Plan: -repeat drainage of L effusion with IR and placement of pigtail catheter on L  6 mm Lt upper lobe pulmonary nodule.  Incidental finding on CT chest 11/02/11. Plan: -Will need outpt follow up  Metastatic colon cancer. Plan: -Pt requesting referral to new oncologist>>defer decision to primary team.    CAD.  Chronic systolic CHF Hyperlipidemia.   Plan: -per primary team -[facility] following   Coralyn Helling, MD [facility] Pulmonary/Critical Care 12/21/2011, 8:35 AM Pager:  [phone] After 3pm call: [phone]

## 2011-12-21 NOTE — Progress Notes (Signed)
Patient ID: Adam Henderson, male   DOB: 1955/12/04, 56 y.o.   MRN: [id number]    Subjective:  Denies SSCP, palpitations or Dyspnea Vicodin helps  Objective:  Filed Vitals:   12/20/11 0920 12/20/11 1500 12/20/11 2220 12/21/11 0606  BP: 97/64  98/64 97/64  Pulse: 92  97 84  Temp:   98.1 F (36.7 C) 97.9 F (36.6 C)  TempSrc:   Oral Oral  Resp:  20 16 20   Height:      Weight:    195 lb 8.8 oz (88.7 kg)  SpO2:  99% 96% 95%    Intake/Output from previous day:  Intake/Output Summary (Last 24 hours) at 12/21/11 0757 Last data filed at 12/21/11 0610  Gross per 24 hour  Intake    240 ml  Output   4145 ml  Net  -3905 ml    Physical Exam: Affect appropriate Pale overweight male HEENT: normal Neck supple with no adenopathy JVP normal no bruits no thyromegaly Lungs decreased BS left base Heart:  S1/S2 no murmur, no rub, gallop or click PMI normal Abdomen: benighn, BS positve, no tenderness, no AAA no bruit.  No HSM or HJR Distal pulses intact with no bruits No edema Neuro non-focal Skin warm and dry No muscular weakness   Lab Results: Basic Metabolic Panel:  Basename 12/21/11 0500 12/19/11 0750  NA 132* 132*  K 4.1 4.0  CL 99 98  CO2 26 27  GLUCOSE 117* 123*  BUN 6 4*  CREATININE 0.66 0.61  CALCIUM 8.7 8.6  MG -- 2.1  PHOS -- --   Liver Function Tests:  [facility] 12/18/11 1832  AST 12  ALT 5  ALKPHOS 77  BILITOT 0.3  PROT 8.3  ALBUMIN 2.7*   No results found for this basename: LIPASE:2,AMYLASE:2 in the last 72 hours CBC:  Basename 12/21/11 0500 12/19/11 0750 12/18/11 1832  WBC 10.4 12.6* --  NEUTROABS -- -- 11.7*  HGB 10.2* 10.2* --  HCT 32.0* 31.8* --  MCV 82.1 82.0 --  PLT 408* 417* --   Cardiac Enzymes:  Basename 12/18/11 1841  CKTOTAL --  CKMB --  CKMBINDEX --  TROPONINI <0.30    Imaging: Dg Chest Port 1 View  12/19/2011  *RADIOLOGY REPORT*  Clinical Data: Left chest pain following thoracentesis.  PORTABLE CHEST - 1 VIEW   Comparison: Portable film earlier in the day.  Findings: Moderate left pleural effusion persists.  Compressive atelectasis left base.  Cardiomegaly.  No pneumothorax.  Right lung shows mild vascular congestion.  Unchanged Port-A-Cath. Unremarkable osseous structures.  IMPRESSION: Moderate left effusion persists.  There is no pneumothorax or significant change from earlier in the day.   Original Report Authenticated By: Elsie Stain, M.D.    Dg Chest Port 1 View  12/19/2011  *RADIOLOGY REPORT*  Clinical Data: Left thoracentesis follow-up.  PORTABLE CHEST - 1 VIEW  Comparison: 12/18/2011 CT  Findings: Decrease in size of the left pleural effusion. Hazy appearance to the left aerated portion of the lung may correspond to superimposed layering fluid in the pleural space or airspace edema.  No pneumothorax visualized.  Cardiomegaly.  Right Port-A- Cath with tip projecting over the cavoatrial junction.  No interval osseous change.  IMPRESSION: Decreased size of the left pleural effusion status post thoracentesis.  Hazy appearance to the left lung may reflect edema or superimposed layering pleural fluid.  No pneumothorax.   Original Report Authenticated By: Waneta Martins, M.D.     Cardiac Studies:  ECG:  9/17  NSR rate 92  Lateral T wave changes LVH PVC   Telemetry: NSR no significant VT  12/21/2011   Echo:  11/02/11  Study Conclusions  - Left ventricle: Poor acoustic windows limit study. LVEF is apprxoimately 30 to 35% with severe hypokinesis of the inferior, septlal and posterior walls. The cavity size was severely dilated. - Mitral valve: Mild regurgitation. - Left atrium: The atrium was moderately dilated.    Medications:     . ceFEPime (MAXIPIME) IV  1 g Intravenous Q8H  . levofloxacin (LEVAQUIN) IV  750 mg Intravenous Q24H  . pantoprazole  40 mg Oral Daily  . simvastatin  10 mg Oral QHS  . sodium chloride  3 mL Intravenous Q12H  . vancomycin  1,000 mg Intravenous Q8H  . vitamin  B-12  1,000 mcg Oral Daily  . vitamin C  500 mg Oral Daily       . sodium chloride 125 mL/hr at 12/20/11 1324    Assessment/Plan:  NSVT:  See note from SK  Given cancer not appr for AICD  Asymptomatic CHF:  Improved  Check BNP  Effusion related to cancer   Adenocarcinoma:  ? F/U with Dr Mariel Sleet in [place] given patients frustration CAD:  Should be on 81mg  ASA  Pain not related R/O Chol:  Continue statin  Charlton Haws 12/21/2011, 7:57 AM

## 2011-12-22 ENCOUNTER — Inpatient Hospital Stay (HOSPITAL_COMMUNITY): Payer: BC Managed Care – PPO

## 2011-12-22 DIAGNOSIS — K900 Celiac disease: Secondary | ICD-10-CM

## 2011-12-22 LAB — CBC
HCT: 33.6 % — ABNORMAL LOW (ref 39.0–52.0)
MCV: 83 fL (ref 78.0–100.0)
RDW: 14.3 % (ref 11.5–15.5)
WBC: 9.8 10*3/uL (ref 4.0–10.5)

## 2011-12-22 LAB — BASIC METABOLIC PANEL
BUN: 7 mg/dL (ref 6–23)
Chloride: 102 mEq/L (ref 96–112)
Creatinine, Ser: 0.58 mg/dL (ref 0.50–1.35)
GFR calc Af Amer: >90 mL/min (ref >90)
Glucose, Bld: 142 mg/dL — ABNORMAL HIGH (ref 70–99)

## 2011-12-22 LAB — BODY FLUID CULTURE

## 2011-12-22 MED ORDER — MORPHINE SULFATE 2 MG/ML IJ SOLN
1.0000 mg | INTRAMUSCULAR | Status: DC | PRN
  Administered 2011-12-22 – 2011-12-28 (×19): 2 mg via INTRAVENOUS
  Filled 2011-12-22 (×19): qty 1

## 2011-12-22 MED ORDER — HYDROMORPHONE HCL PF 1 MG/ML IJ SOLN
0.5000 mg | INTRAMUSCULAR | Status: DC | PRN
  Administered 2011-12-27: 0.5 mg via INTRAVENOUS
  Filled 2011-12-22: qty 1

## 2011-12-22 MED ORDER — HYDROMORPHONE HCL PF 1 MG/ML IJ SOLN
1.0000 mg | Freq: Once | INTRAMUSCULAR | Status: AC
  Administered 2011-12-22: 1 mg via INTRAVENOUS
  Filled 2011-12-22: qty 1

## 2011-12-22 MED ORDER — HYDROCODONE-ACETAMINOPHEN 5-325 MG PO TABS
1.0000 | ORAL_TABLET | ORAL | Status: DC | PRN
  Administered 2011-12-22: 2 via ORAL
  Administered 2011-12-22 – 2011-12-26 (×2): 1 via ORAL
  Filled 2011-12-22 (×2): qty 2
  Filled 2011-12-22: qty 1

## 2011-12-22 NOTE — Progress Notes (Signed)
Subjective: Had pain around chest tube insertion site. Breathing continues to improve.  Objective: Vital signs in last 24 hours: Filed Vitals:   12/21/11 1700 12/21/11 1730 12/21/11 2030 12/22/11 0641  BP: 100/66 94/58 109/69 95/59  Pulse: 91 92 104 83  Temp:   99.7 F (37.6 C) 97.4 F (36.3 C)  TempSrc:   Oral Oral  Resp:   18 20  Height:      Weight:    88.3 kg (194 lb 10.7 oz)  SpO2:   97% 98%   Weight change: -0.4 kg (-14.1 oz)  Intake/Output Summary (Last 24 hours) at 12/22/11 1223 Last data filed at 12/22/11 1100  Gross per 24 hour  Intake 2004.25 ml  Output   4525 ml  Net -2520.75 ml    Physical Exam: General: Awake, Oriented, No acute distress. HEENT: EOMI. Neck: Supple CV: S1 and S2 Lungs: Diminished breath sounds at the left lung base. Abdomen: Soft, Nontender, Nondistended, +bowel sounds. Ext: Good pulses. Trace edema. Chest: Chest tube on the left.  Lab Results: Basic Metabolic Panel:  Lab 12/22/11 0272 12/21/11 0500 12/19/11 0750 12/18/11 1832  NA 136 132* 132* 136  K 4.2 4.1 4.0 4.6  CL 102 99 98 97  CO2 26 26 27 27   GLUCOSE 142* 117* 123* 133*  BUN 7 6 4* 5*  CREATININE 0.58 0.66 0.61 0.61  CALCIUM 8.7 8.7 8.6 9.4  MG -- -- 2.1 --  PHOS -- -- -- --   Liver Function Tests:  Lab 12/18/11 1832  AST 12  ALT 5  ALKPHOS 77  BILITOT 0.3  PROT 8.3  ALBUMIN 2.7*   No results found for this basename: LIPASE:5,AMYLASE:5 in the last 168 hours No results found for this basename: AMMONIA:5 in the last 168 hours CBC:  Lab 12/22/11 0547 12/21/11 0500 12/19/11 0750 12/18/11 1832  WBC 9.8 10.4 12.6* 14.5*  NEUTROABS -- -- -- 11.7*  HGB 10.5* 10.2* 10.2* 11.5*  HCT 33.6* 32.0* 31.8* 35.7*  MCV 83.0 82.1 82.0 83.0  PLT 421* 408* 417* 500*   Cardiac Enzymes:  Lab 12/18/11 1841  CKTOTAL --  CKMB --  CKMBINDEX --  TROPONINI <0.30   BNP (last 3 results)  Basename 11/16/11 1150 11/01/11 1532  PROBNP 229.0* 1658.0*   CBG: No results found  for this basename: GLUCAP:5 in the last 168 hours No results found for this basename: HGBA1C:5 in the last 72 hours Other Labs: No components found with this basename: POCBNP:3 No results found for this basename: DDIMER:2 in the last 168 hours No results found for this basename: CHOL:2,HDL:2,LDLCALC:2,TRIG:2,CHOLHDL:2,LDLDIRECT:2 in the last 168 hours No results found for this basename: TSH,T4TOTAL,FREET3,T3FREE,FREET4,THYROIDAB in the last 168 hours No results found for this basename: VITAMINB12:2,FOLATE:2,FERRITIN:2,TIBC:2,IRON:2,RETICCTPCT:2 in the last 168 hours  Micro Results: Recent Results (from the past 240 hour(s))  CULTURE, BLOOD (ROUTINE X 2)     Status: Normal (Preliminary result)   Collection Time   12/18/11  8:54 PM      Component Value Range Status Comment   Specimen Description BLOOD ARM RIGHT   Final    Special Requests BOTTLES DRAWN AEROBIC AND ANAEROBIC 10CC   Final    Culture  Setup Time 12/19/2011 01:47   Final    Culture     Final    Value:        BLOOD CULTURE RECEIVED NO GROWTH TO DATE CULTURE WILL BE HELD FOR 5 DAYS BEFORE ISSUING A FINAL NEGATIVE REPORT   Report Status PENDING  Incomplete   CULTURE, BLOOD (ROUTINE X 2)     Status: Normal (Preliminary result)   Collection Time   12/18/11  9:08 PM      Component Value Range Status Comment   Specimen Description BLOOD HAND LEFT   Final    Special Requests BOTTLES DRAWN AEROBIC ONLY 6CC   Final    Culture  Setup Time 12/19/2011 01:47   Final    Culture     Final    Value:        BLOOD CULTURE RECEIVED NO GROWTH TO DATE CULTURE WILL BE HELD FOR 5 DAYS BEFORE ISSUING A FINAL NEGATIVE REPORT   Report Status PENDING   Incomplete   GRAM STAIN     Status: Normal   Collection Time   12/19/11 11:45 AM      Component Value Range Status Comment   Specimen Description PLEURAL FLUID LEFT   Final    Special Requests   Final    Gram Stain     Final    Value: RARE WBC PRESENT,BOTH PMN AND MONONUCLEAR     NO ORGANISMS  SEEN   Report Status 12/19/2011 FINAL   Final   BODY FLUID CULTURE     Status: Normal (Preliminary result)   Collection Time   12/19/11 11:45 AM      Component Value Range Status Comment   Specimen Description PLEURAL FLUID LEFT   Final    Special Requests   Final    Gram Stain     Final    Value: RARE WBC PRESENT,BOTH PMN AND MONONUCLEAR     NO ORGANISMS SEEN     Performed at [facility]   Culture NO GROWTH 2 DAYS   Final    Report Status PENDING   Incomplete   AFB CULTURE WITH SMEAR     Status: Normal (Preliminary result)   Collection Time   12/19/11 11:45 AM      Component Value Range Status Comment   Specimen Description PLEURAL FLUID LEFT   Final    Special Requests   Final    ACID FAST SMEAR NO ACID FAST BACILLI SEEN   Final    Culture     Final    Value: CULTURE WILL BE EXAMINED FOR 6 WEEKS BEFORE ISSUING A FINAL REPORT   Report Status PENDING   Incomplete   FUNGUS CULTURE W SMEAR     Status: Normal (Preliminary result)   Collection Time   12/19/11 11:45 AM      Component Value Range Status Comment   Specimen Description PLEURAL FLUID LEFT   Final    Special Requests   Final    Fungal Smear NO YEAST OR FUNGAL ELEMENTS SEEN   Final    Culture CULTURE IN PROGRESS FOR FOUR WEEKS   Final    Report Status PENDING   Incomplete   CULTURE, EXPECTORATED SPUTUM-ASSESSMENT     Status: Normal   Collection Time   12/19/11  2:07 PM      Component Value Range Status Comment   Specimen Description SPUTUM   Final    Special Requests NONE   Final    Sputum evaluation     Final    Value: MICROSCOPIC FINDINGS SUGGEST THAT THIS SPECIMEN IS NOT REPRESENTATIVE OF LOWER RESPIRATORY SECRETIONS. PLEASE RECOLLECT.     CALLED TO RN J.SMITH AT 1533 BY L.PITT 12/19/11.   Report Status 12/19/2011 FINAL   Final  Studies/Results: Dg Chest 2 View  12/21/2011  *RADIOLOGY REPORT*  Clinical Data: Follow up left pleural effusion  CHEST - 2 VIEW  Comparison: 12/19/2011  Findings:  Right chest wall porta-catheter is identified with tip in the cavoatrial junction.  There is a large left pleural effusion which is not significantly changed from previous exam.  Right lung is clear.  IMPRESSION:  1.  No change in volume of left pleural effusion.   Original Report Authenticated By: Rosealee Albee, M.D.    Ct Guided Abscess Drain  12/21/2011  *RADIOLOGY REPORT*  Clinical Data/Indication: LEFT PLEURAL EFFUSION  CT GUIDED ABCESS DRAINAGE WITH CATHETER  Sedation: Versed 2.0 mg, Fentanyl 100 mg.  Total Moderate Sedation Time: 21 minutes.  Procedure: The procedure, risks, benefits, and alternatives were explained to the patient. Questions regarding the procedure were encouraged and answered. The patient understands and consents to the procedure.  The left anterolateral chest was prepped with betadine in a sterile fashion, and a sterile drape was applied covering the operative field. A sterile gown and sterile gloves were used for the procedure.  Under CT guidance, an 19 gauge needle was inserted into the left pleural space via left anterior axillary line approach.  It was removed over an 0038 wire. Successive 12 and 18-French dilators were inserted.  A 16-French Thal-Quik drain was then advanced over the wire coiled in the pleural fluid.  It was then attached to a Pleur-Evac at 20 cm water suction.  Findings: Imaging demonstrates placement of a left 16-French thoracostomy tube.  Complications: None.  IMPRESSION: Successful left  16-French thoracostomy tube placement.   Original Report Authenticated By: Donavan Burnet, M.D.     Medications: I have reviewed the patient's current medications. Scheduled Meds:    . aspirin EC  81 mg Oral Daily  . ceFEPime (MAXIPIME) IV  1 g Intravenous Q8H  . fentaNYL      .  HYDROmorphone (DILAUDID) injection  1 mg Intravenous Once  . levofloxacin  750 mg Oral QHS  . midazolam      . pantoprazole  40 mg Oral Daily  . simvastatin  10 mg Oral QHS  . sodium  chloride  3 mL Intravenous Q12H  . vitamin B-12  1,000 mcg Oral Daily  . vitamin C  500 mg Oral Daily  . DISCONTD: vancomycin  1,000 mg Intravenous Q8H   Continuous Infusions:    . sodium chloride 75 mL/hr at 12/21/11 2309  . DISCONTD: sodium chloride 125 mL/hr at 12/20/11 1324   PRN Meds:.albuterol, benzonatate, fentaNYL, guaiFENesin-dextromethorphan, HYDROcodone-acetaminophen, HYDROmorphone (DILAUDID) injection, HYDROmorphone (DILAUDID) injection, midazolam, morphine injection, prochlorperazine, sodium chloride, DISCONTD: HYDROcodone-acetaminophen  Assessment/Plan: Pleural effusion Fluid is exudative, cytology was negative for any malignant cells, had thoracentesis on 12/19/2011.  Appreciate pulmonary input. Pleural effusion likely due to pneumonia.  Discontinue Vancomycin on 12/21/2011. Discontinue cefepime on 12/22/2011. Continue levofloxacin for ~2 weeks total, antibiotics are 12/19/2011. CT angiogram on 12/18/2011, negative for pulmonary embolism, marked increase in left pleural effusion, new small pericardial effusion. Had another thoracentesis on 12/22/2011. Continue chest tube. Appreciate pulmonary input. Pulmonary planning on placing a pigtail catheter, plan on discharge after patient has pigtail catheter placed. AFB cultures negative for AFB so far, body fluid culture negative so far, fungal culture negative so far, blood cultures x2 no growth to date.  Nonsustained ventricular tachycardia Patient is not a candidate for ICD implantation.  No further workup per cardiology.  Appreciate their input.  Hypotension Stable.  Not on any antihypertensive  medications.  Leukocytosis Improve.  Continue to monitor.  Hyponatremia Continue to monitor.  Maroon-colored stools Hemoglobin stable.  Continue to monitor. Stool occult pending.  Chest pain Pleuritic, likely due to cough.  Stable.  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 He would like to establish with oncology in [place], [place]. Patient scheduled an appointment with Dr. Mariel Sleet on 01/01/2012 at 11:30 AM.  6 mm Lt upper lobe pulmonary nodule Incidental finding on CT chest 11/02/11. Will need outpatient followup.  Hyperlipidemia Continue statin.  Coronary artery disease Resumed aspirin today.  Prophylaxis SCDs.  Disposition Pending, pulmonary workup and removal of chest tube.   LOS: 4 days  Damondre Pfeifle A, MD 12/22/2011, 12:23 PM

## 2011-12-22 NOTE — Progress Notes (Signed)
Name: Adam Henderson MRN: [id number] DOB: 12/19/1955    LOS: 4 Consulting MD:  Dr. Betti Cruz Reason for Consult:  Pleural Effusion Eval   History of Present Illness: 56 yo male admitted 12/18/2011 dyspnea, night sweats, dry cough and chest pain.  Had several weeks of outpt tx for PNA.  Found to have large Lt pleural effusion and PCCM consulted 9/17.  Has hx of Stage IIIb rectal cancer s/p resection and s/p completion of chemo October 2012.  Found to have pelvic recurrence 12/05/11.  PMHx CAD, Hyperlipidemia, Systolic CHF  Subjective:    Cultures / Cytology: BC x 2 9/16>>> Fungal / AFB 9/17>>> Pleural Culture>>> Pleural Cytology>>>reactive mesothelial cells, negative for malignancy  Antibiotics: Cefepime 9/16 (?CAP)>>> levaquin 9/16>>>  Tests / Events: CT chest 9/16>>Large left pleural effusion, compressive ATX, small pericardial effusion, coronary calcification, cardiomegaly Lt thoracentesis 9/17>>1500 ml fluid, protein 5.4, LDH 161, glucose 137, 537 WBC (60N, 13L, 15M, E3)  Subjective States he is having significant pain. Current rx is not helping.  Vital Signs: BP 95/59  Pulse 83  Temp 97.4 F (36.3 C) (Oral)  Resp 20  Ht 5\' 9"  (1.753 m)  Wt 88.3 kg (194 lb 10.7 oz)  BMI 28.75 kg/m2  SpO2 98%  Intake/Output Summary (Last 24 hours) at 12/22/11 1124 Last data filed at 12/22/11 0900  Gross per 24 hour  Intake 2004.25 ml  Output   4825 ml  Net -2820.75 ml  2 liters   Physical Examination: General:  Chronically ill appearing white male. Frustrated due to dyspnea,  Neuro:  Alert and oriented w/out focal def HEENT:  [place], no VJD Cardiovascular:  rrr Lungs:  Decreased on left, Left CT w/ serous bloody drainage and clots. No air leak.  Abdomen:  Non-tender Musculoskeletal:  intact Skin: intact    Labs and Imaging:   Lab 12/22/11 0547 12/21/11 0500 12/19/11 0750  NA 136 132* 132*  K 4.2 4.1 4.0  CL 102 99 98  CO2 26 26 27   BUN 7 6 4*  CREATININE 0.58 0.66  0.61  GLUCOSE 142* 117* 123*    Lab 12/22/11 0547 12/21/11 0500 12/19/11 0750  HGB 10.5* 10.2* 10.2*  HCT 33.6* 32.0* 31.8*  WBC 9.8 10.4 12.6*  PLT 421* 408* 417*    Lab 12/22/11 0547 12/21/11 0500 12/19/11 0750 12/18/11 1832  PROCALCITON -- <0.10 -- --  WBC 9.8 10.4 12.6* 14.5*  LATICACIDVEN -- -- -- --    Dg Chest 2 View  12/21/2011  *RADIOLOGY REPORT*  Clinical Data: Follow up left pleural effusion  CHEST - 2 VIEW  Comparison: 12/19/2011  Findings: Right chest wall porta-catheter is identified with tip in the cavoatrial junction.  There is a large left pleural effusion which is not significantly changed from previous exam.  Right lung is clear.  IMPRESSION:  1.  No change in volume of left pleural effusion.   Original Report Authenticated By: Rosealee Albee, M.D.    Ct Guided Abscess Drain  12/21/2011  *RADIOLOGY REPORT*  Clinical Data/Indication: LEFT PLEURAL EFFUSION  CT GUIDED ABCESS DRAINAGE WITH CATHETER  Sedation: Versed 2.0 mg, Fentanyl 100 mg.  Total Moderate Sedation Time: 21 minutes.  Procedure: The procedure, risks, benefits, and alternatives were explained to the patient. Questions regarding the procedure were encouraged and answered. The patient understands and consents to the procedure.  The left anterolateral chest was prepped with betadine in a sterile fashion, and a sterile drape was applied covering the operative field.  A sterile gown and sterile gloves were used for the procedure.  Under CT guidance, an 19 gauge needle was inserted into the left pleural space via left anterior axillary line approach.  It was removed over an 0038 wire. Successive 12 and 18-French dilators were inserted.  A 16-French Thal-Quik drain was then advanced over the wire coiled in the pleural fluid.  It was then attached to a Pleur-Evac at 20 cm water suction.  Findings: Imaging demonstrates placement of a left 16-French thoracostomy tube.  Complications: None.  IMPRESSION: Successful left   16-French thoracostomy tube placement.   Original Report Authenticated By: Donavan Burnet, M.D.     Assessment and Plan:  Large Left pleural effusion. Probable L parapneumonic effusion likely d/t PNA. S/p Pigtail cath placement on 9/19 Plan: -repeat drainage of L effusion with IR and placement of pigtail catheter on L -f/u CXR -would complete total 2 weeks antibiotic therapy, likely monotherapy w/ levaquin would be adequate -f/u CXR, for resolution of effusion.  -think he should be on sxn. Will place on 20 cmH20, discussed w/ IR.  -Follow 24 hr output closely -at risk re expansion edema with volume off, low threshold lasix with any declines  6 mm Lt upper lobe pulmonary nodule.  Incidental finding on CT chest 11/02/11. Plan: -Will need outpt follow up  Metastatic colon cancer. Plan: -Pt requesting referral to new oncologist>>defer decision to primary team.    CAD.  Chronic systolic CHF Hyperlipidemia.   Plan: -per primary team -[facility] following   Mcarthur Rossetti. Tyson Alias, MD, FACP Pgr: [phone]  Pulmonary & Critical Care

## 2011-12-22 NOTE — Consult Note (Signed)
Adam Henderson ANTIBIOTIC CONSULT NOTE - FOLLOW UP  Pharmacy Consult for Cefepime, Levaquin Indication: Presumed HCAP with pleural effusion  No Known Allergies  Patient Measurements: Height: 5\' 9"  (175.3 cm) Weight: 194 lb 10.7 oz (88.3 kg) IBW/kg (Calculated) : 70.7    Vital Signs: BP 95/59  Pulse 83  Temp 97.4 F (36.3 C) (Oral)  Resp 20  Ht 5\' 9"  (1.753 m)  Wt 194 lb 10.7 oz (88.3 kg)  BMI 28.75 kg/m2  SpO2 98%   Labs:  Basename 12/22/11 0547 12/21/11 0500  WBC 9.8 10.4  HGB 10.5* 10.2*  PLT 421* 408*  CREATININE 0.58 0.66   Estimated Creatinine Clearance: 116 ml/min (by C-G formula based on Cr of 0.58).  Microbiology: Recent Results (from the past 720 hour(s))  CULTURE, ROUTINE-ABSCESS     Status: Normal   Collection Time   12/05/11  3:50 PM      Component Value Range Status Comment   Specimen Description ABSCESS PELVIS   Final    Special Requests NONE   Final    Gram Stain     Final    Value: ABUNDANT WBC PRESENT, PREDOMINANTLY PMN     NO SQUAMOUS EPITHELIAL CELLS SEEN     NO ORGANISMS SEEN   Culture NO GROWTH 3 DAYS   Final    Report Status 12/08/2011 FINAL   Final   ANAEROBIC CULTURE     Status: Normal   Collection Time   12/05/11  3:50 PM      Component Value Range Status Comment   Specimen Description ABSCESS PELVIS   Final    Special Requests NONE   Final    Gram Stain     Final    Value: ABUNDANT WBC PRESENT, PREDOMINANTLY PMN     NO ORGANISMS SEEN   Culture NO ANAEROBES ISOLATED   Final    Report Status 12/10/2011 FINAL   Final   CULTURE, BLOOD (ROUTINE X 2)     Status: Normal (Preliminary result)   Collection Time   12/18/11  8:54 PM      Component Value Range Status Comment   Specimen Description BLOOD ARM RIGHT   Final    Special Requests BOTTLES DRAWN AEROBIC AND ANAEROBIC 10CC   Final    Culture  Setup Time 12/19/2011 01:47   Final    Culture     Final    Value:        BLOOD CULTURE RECEIVED NO GROWTH TO DATE CULTURE WILL BE HELD FOR 5 DAYS BEFORE  ISSUING A FINAL NEGATIVE REPORT   Report Status PENDING   Incomplete   CULTURE, BLOOD (ROUTINE X 2)     Status: Normal (Preliminary result)   Collection Time   12/18/11  9:08 PM      Component Value Range Status Comment   Specimen Description BLOOD HAND LEFT   Final    Special Requests BOTTLES DRAWN AEROBIC ONLY [facility]   Final    Culture  Setup Time 12/19/2011 01:47   Final    Culture     Final    Value:        BLOOD CULTURE RECEIVED NO GROWTH TO DATE CULTURE WILL BE HELD FOR 5 DAYS BEFORE ISSUING A FINAL NEGATIVE REPORT   Report Status PENDING   Incomplete   GRAM STAIN     Status: Normal   Collection Time   12/19/11 11:45 AM      Component Value Range Status Comment   Specimen Description PLEURAL FLUID  LEFT   Final    Special Requests   Final    Gram Stain     Final    Value: RARE WBC PRESENT,BOTH PMN AND MONONUCLEAR     NO ORGANISMS SEEN   Report Status 12/19/2011 FINAL   Final   BODY FLUID CULTURE     Status: Normal (Preliminary result)   Collection Time   12/19/11 11:45 AM      Component Value Range Status Comment   Specimen Description PLEURAL FLUID LEFT   Final    Special Requests   Final    Gram Stain     Final    Value: RARE WBC PRESENT,BOTH PMN AND MONONUCLEAR     NO ORGANISMS SEEN     Performed at [facility]   Culture NO GROWTH 2 DAYS   Final    Report Status PENDING   Incomplete   AFB CULTURE WITH SMEAR     Status: Normal (Preliminary result)   Collection Time   12/19/11 11:45 AM      Component Value Range Status Comment   Specimen Description PLEURAL FLUID LEFT   Final    Special Requests   Final    ACID FAST SMEAR NO ACID FAST BACILLI SEEN   Final    Culture     Final    Value: CULTURE WILL BE EXAMINED FOR 6 WEEKS BEFORE ISSUING A FINAL REPORT   Report Status PENDING   Incomplete   FUNGUS CULTURE W SMEAR     Status: Normal (Preliminary result)   Collection Time   12/19/11 11:45 AM      Component Value Range Status Comment   Specimen  Description PLEURAL FLUID LEFT   Final    Special Requests   Final    Fungal Smear NO YEAST OR FUNGAL ELEMENTS SEEN   Final    Culture CULTURE IN PROGRESS FOR FOUR WEEKS   Final    Report Status PENDING   Incomplete   CULTURE, EXPECTORATED SPUTUM-ASSESSMENT     Status: Normal   Collection Time   12/19/11  2:07 PM      Component Value Range Status Comment   Specimen Description SPUTUM   Final    Special Requests NONE   Final    Sputum evaluation     Final    Value: MICROSCOPIC FINDINGS SUGGEST THAT THIS SPECIMEN IS NOT REPRESENTATIVE OF LOWER RESPIRATORY SECRETIONS. PLEASE RECOLLECT.     CALLED TO RN J.SMITH AT 1533 BY L.PITT 12/19/11.   Report Status 12/19/2011 FINAL   Final     Anti-infectives, Current     Start     Dose/Rate Route Frequency Ordered Stop   12/21/11 2200   levofloxacin (LEVAQUIN) tablet 750 mg        750 mg Oral Daily at bedtime 12/21/11 1431 12/25/11 2159   12/19/11 0045   ceFEPIme (MAXIPIME) 1 g in dextrose 5 % 50 mL IVPB        1 g 100 mL/hr over 30 Minutes Intravenous 3 times per day 12/19/11 0035 12/26/11 2359          Assessment:  Day # 5 of Levaquin, Day # 4 Cefepime for coverage of HCAP.  Patient Afebrile, WBC 9.8, Cultures NGTD.  CCM recommend completing 2 weeks of antibiotic therapy and narrowing of antibiotic choice to monotherapy.  Plan:  Continue Cefepime and Levaquin as ordered pending review by Primary Team.  No dosage adjustments required.   Velda Shell  J 12/22/2011,12:10 PM

## 2011-12-22 NOTE — Care Management Note (Signed)
    Page 1 of 1   12/22/2011     3:10:41 PM   CARE MANAGEMENT NOTE 12/22/2011  Patient:  Adam Henderson, Adam Henderson   Account Number:  [id number]  Date Initiated:  12/22/2011  Documentation initiated by:  Tera Mater  Subjective/Objective Assessment:   56yo male admitted with PNA.  Pt. lives at home with his mother.     Action/Plan:   In to speak with pt. about discharge plans.  Pt. states he has a walker, and a cane at home.  Pt. has used [facility] in past for [facility] services and was satisfied with their care.   Anticipated DC Date:  12/25/2011   Anticipated DC Plan:  HOME W HOME HEALTH SERVICES      DC Planning Services  CM consult      Choice offered to / List presented to:             [facility] agency  [facility]   Status of service:  In process, will continue to follow Medicare Important Message given?   (If response is "NO", the following Medicare IM given date fields will be blank) Date Medicare IM given:   Date Additional Medicare IM given:    Discharge Disposition:    Per UR Regulation:  Reviewed for med. necessity/level of care/duration of stay  If discussed at Long Length of Stay Meetings, dates discussed:    Comments:  12/22/11 1400 Tera Mater, RN, BSN NCM [phone] Presently pt. has left chest tube for drainage of pleural effusion.  Should pt. be discharged home with pleurix catheter, HH RN would need to be ordered.  NCM to follow for discharge needs.

## 2011-12-22 NOTE — Progress Notes (Addendum)
Subjective: Pt breathing better since effusion drained 9/19; has some soreness at left lateral chest insertion site  Objective: Vital signs in last 24 hours: Temp:  [97.4 F (36.3 C)-99.7 F (37.6 C)] 97.4 F (36.3 C) (09/20 0641) Pulse Rate:  [66-104] 83  (09/20 0641) Resp:  [18-26] 20  (09/20 0641) BP: (84-109)/(57-75) 95/59 mmHg (09/20 0641) SpO2:  [97 %-98 %] 98 % (09/20 0641) Weight:  [194 lb 10.7 oz (88.3 kg)] 194 lb 10.7 oz (88.3 kg) (09/20 0641) Last BM Date: 12/21/11  Intake/Output from previous day: 09/19 0701 - 09/20 0700 In: 1764.3 [P.O.:360; I.V.:1104.3; IV Piggyback:300] Out: 4825 [Urine:2675; Drains:2150] Intake/Output this shift: Total I/O In: 240 [P.O.:240] Out: 900 [Urine:900]  Left chest drain intact, output over 2.5 liters total since drain placed 9/19; bloody fluid in pleuravac, cx's pend; no air leak, all connections secure; few fragments of clotted blood/fibrous tissue in tubing; insertion site ok, mild-mod tender  Lab Results:   Basename 12/22/11 0547 12/21/11 0500  WBC 9.8 10.4  HGB 10.5* 10.2*  HCT 33.6* 32.0*  PLT 421* 408*   BMET  Basename 12/22/11 0547 12/21/11 0500  NA 136 132*  K 4.2 4.1  CL 102 99  CO2 26 26  GLUCOSE 142* 117*  BUN 7 6  CREATININE 0.58 0.66  CALCIUM 8.7 8.7   PT/INR  Basename 12/21/11 0500  LABPROT 18.4*  INR 1.58*   ABG No results found for this basename: PHART:2,PCO2:2,PO2:2,HCO3:2 in the last 72 hours  Studies/Results: Dg Chest 2 View  12/21/2011  *RADIOLOGY REPORT*  Clinical Data: Follow up left pleural effusion  CHEST - 2 VIEW  Comparison: 12/19/2011  Findings: Right chest wall porta-catheter is identified with tip in the cavoatrial junction.  There is a large left pleural effusion which is not significantly changed from previous exam.  Right lung is clear.  IMPRESSION:  1.  No change in volume of left pleural effusion.   Original Report Authenticated By: Rosealee Albee, M.D.    Ct Guided Abscess  Drain  12/21/2011  *RADIOLOGY REPORT*  Clinical Data/Indication: LEFT PLEURAL EFFUSION  CT GUIDED ABCESS DRAINAGE WITH CATHETER  Sedation: Versed 2.0 mg, Fentanyl 100 mg.  Total Moderate Sedation Time: 21 minutes.  Procedure: The procedure, risks, benefits, and alternatives were explained to the patient. Questions regarding the procedure were encouraged and answered. The patient understands and consents to the procedure.  The left anterolateral chest was prepped with betadine in a sterile fashion, and a sterile drape was applied covering the operative field. A sterile gown and sterile gloves were used for the procedure.  Under CT guidance, an 19 gauge needle was inserted into the left pleural space via left anterior axillary line approach.  It was removed over an 0038 wire. Successive 12 and 18-French dilators were inserted.  A 16-French Thal-Quik drain was then advanced over the wire coiled in the pleural fluid.  It was then attached to a Pleur-Evac at 20 cm water suction.  Findings: Imaging demonstrates placement of a left 16-French thoracostomy tube.  Complications: None.  IMPRESSION: Successful left  16-French thoracostomy tube placement.   Original Report Authenticated By: Donavan Burnet, M.D.     Anti-infectives: Anti-infectives     Start     Dose/Rate Route Frequency Ordered Stop   12/21/11 2200   levofloxacin (LEVAQUIN) tablet 750 mg        750 mg Oral Daily at bedtime 12/21/11 1431 12/25/11 2159   12/19/11 0800   vancomycin (VANCOCIN) IVPB 1000  mg/200 mL premix  Status:  Discontinued        1,000 mg 200 mL/hr over 60 Minutes Intravenous Every 8 hours 12/19/11 0106 12/21/11 1342   12/19/11 0115   vancomycin (VANCOCIN) IVPB 1000 mg/200 mL premix        1,000 mg 200 mL/hr over 60 Minutes Intravenous NOW 12/19/11 0106 12/19/11 0218   12/19/11 0045   ceFEPIme (MAXIPIME) 1 g in dextrose 5 % 50 mL IVPB        1 g 100 mL/hr over 30 Minutes Intravenous 3 times per day 12/19/11 0035 12/26/11 2359    12/19/11 0045   levofloxacin (LEVAQUIN) IVPB 750 mg        750 mg 100 mL/hr over 90 Minutes Intravenous Every 24 hours 12/19/11 0035 12/20/11 2149   12/18/11 2100  piperacillin-tazobactam (ZOSYN) IVPB 4.5 g       4.5 g 200 mL/hr over 30 Minutes Intravenous  Once 12/18/11 2042 12/18/11 2319   12/18/11 2045   levofloxacin (LEVAQUIN) IVPB 750 mg  Status:  Discontinued        750 mg 100 mL/hr over 90 Minutes Intravenous Every 24 hours 12/18/11 2042 12/19/11 0106          Assessment/Plan: s/p left pleural effusion drainage 9/19;  check CXR; trial of pleuravac to wall suction to expedite fluid removal if effusion persists by CXR; check pleural  fluid cxs( prev cytology neg for malig cells).  LOS: 4 days    ALLRED,D [facility] 12/22/2011

## 2011-12-23 ENCOUNTER — Inpatient Hospital Stay (HOSPITAL_COMMUNITY): Payer: BC Managed Care – PPO

## 2011-12-23 LAB — CBC
HCT: 36.6 % — ABNORMAL LOW (ref 39.0–52.0)
MCH: 26.5 pg (ref 26.0–34.0)
MCHC: 32.2 g/dL (ref 30.0–36.0)
MCV: 82.2 fL (ref 78.0–100.0)
RDW: 14.1 % (ref 11.5–15.5)

## 2011-12-23 LAB — BASIC METABOLIC PANEL
BUN: 5 mg/dL — ABNORMAL LOW (ref 6–23)
Calcium: 9 mg/dL (ref 8.4–10.5)
Creatinine, Ser: 0.46 mg/dL — ABNORMAL LOW (ref 0.50–1.35)
GFR calc Af Amer: >90 mL/min (ref >90)
GFR calc non Af Amer: >90 mL/min (ref >90)
Potassium: 4.1 mEq/L (ref 3.5–5.1)

## 2011-12-23 LAB — GLUCOSE, CAPILLARY: Glucose-Capillary: 148 mg/dL — ABNORMAL HIGH (ref 70–99)

## 2011-12-23 NOTE — Progress Notes (Signed)
Name: Adam Henderson MRN: [id number] DOB: 1956/01/16    LOS: 5 Consulting MD:  Dr. Betti Cruz Reason for Consult:  Pleural Effusion Eval   Brief patient profile:   56 yowm admitted 12/18/2011 dyspnea, night sweats, dry cough and chest pain.  Had several weeks of outpt tx for PNA.  Found to have large Lt pleural effusion and PCCM consulted 9/17.  Has hx of Stage IIIb rectal cancer s/p resection and s/p completion of chemo October 2012.  Found to have pelvic recurrence 12/05/11.  PMHx CAD, Hyperlipidemia, Systolic CHF        Cultures / Cytology: BC x 2 9/16>>> Fungal / AFB 9/17>>> Pleural Culture 9/17 > neg Pleural Cytology>>>reactive mesothelial cells, negative for malignancy  Antibiotics: Cefepime 9/16 (?CAP)> 9/20 levaquin 9/16>>>  Tests / Events: CT chest 9/16>>Large left pleural effusion, compressive ATX, small pericardial effusion, coronary calcification, cardiomegaly Lt thoracentesis 9/17>>1500 ml fluid, protein 5.4, LDH 161, glucose 137, 537 WBC (60N, 13L, 69M, E3) L pigtail 9/19 >>  Subjective Still requesting IV narcotics for pain   Vital Signs: BP 116/72  Pulse 83  Temp 98.3 F (36.8 C) (Oral)  Resp 19  Ht 5\' 9"  (1.753 m)  Wt 193 lb 2 oz (87.6 kg)  BMI 28.52 kg/m2  SpO2 100% FIO2  2lpm  Intake/Output Summary (Last 24 hours) at 12/23/11 1115 Last data filed at 12/23/11 0939  Gross per 24 hour  Intake    700 ml  Output   1550 ml  Net   -850 ml      Physical Examination: General:  Chronically ill appearing white male.   Neuro:  Alert and oriented w/out focal def HEENT:  Post, no VJD Cardiovascular:  rrr Lungs:  slt decreased on R.  Abdomen:  Non-tender Musculoskeletal:  intact Skin: intact    Labs and Imaging:   Lab 12/23/11 0624 12/22/11 0547 12/21/11 0500  NA 133* 136 132*  K 4.1 4.2 4.1  CL 98 102 99  CO2 26 26 26   BUN 5* 7 6  CREATININE 0.46* 0.58 0.66  GLUCOSE 145* 142* 117*    Lab 12/23/11 0624 12/22/11 0547 12/21/11 0500  HGB  11.8* 10.5* 10.2*  HCT 36.6* 33.6* 32.0*  WBC 11.9* 9.8 10.4  PLT 439* 421* 408*    Lab 12/23/11 0624 12/22/11 0547 12/21/11 0500 12/19/11 0750  PROCALCITON -- -- <0.10 --  WBC 11.9* 9.8 10.4 12.6*  LATICACIDVEN -- -- -- --    Ct Guided Abscess Drain  12/21/2011  *RADIOLOGY REPORT*  Clinical Data/Indication: LEFT PLEURAL EFFUSION  CT GUIDED ABCESS DRAINAGE WITH CATHETER  Sedation: Versed 2.0 mg, Fentanyl 100 mg.  Total Moderate Sedation Time: 21 minutes.  Procedure: The procedure, risks, benefits, and alternatives were explained to the patient. Questions regarding the procedure were encouraged and answered. The patient understands and consents to the procedure.  The left anterolateral chest was prepped with betadine in a sterile fashion, and a sterile drape was applied covering the operative field. A sterile gown and sterile gloves were used for the procedure.  Under CT guidance, an 19 gauge needle was inserted into the left pleural space via left anterior axillary line approach.  It was removed over an 0038 wire. Successive 12 and 18-French dilators were inserted.  A 16-French Thal-Quik drain was then advanced over the wire coiled in the pleural fluid.  It was then attached to a Pleur-Evac at 20 cm water suction.  Findings: Imaging demonstrates placement of a left 16-French  thoracostomy tube.  Complications: None.  IMPRESSION: Successful left  16-French thoracostomy tube placement.   Original Report Authenticated By: Donavan Burnet, M.D.    Dg Chest Port 1 View  12/23/2011  *RADIOLOGY REPORT*  Clinical Data: Evaluate left-sided chest tube  PORTABLE CHEST - 1 VIEW  Comparison: 12/22/2011; 12/21/2011; 12/19/2011; CT guided thoracostomy tube placement  Findings: Grossly unchanged large cardiac silhouette and mediastinal contours.  Stable position of support apparatus. Interval reaccumulation of a small left-sided pleural effusion and left mid and lower lung heterogeneous / consolidative opacities post  left-sided thoracotomy tube placement. Increased right infrahilar heterogeneous opacities.  No pneumothorax.  Unchanged bones.  IMPRESSION: 1.  Stable positioning of support apparatus. No pneumothorax.  2.  Interval reaccumulation of small left-sided effusion with associated left mid and lower lung opacities, favored to represent atelectasis. 2.  Worsening right infrahilar opacities favored to represent atelectasis.   Original Report Authenticated By: Waynard Reeds, M.D.    Dg Chest Port 1 View  12/22/2011  *RADIOLOGY REPORT*  Clinical Data: Chest tube.  Shortness of breath.  PORTABLE CHEST - 1 VIEW  Comparison: Yesterday.  Findings: Interval small caliber left chest tube.  The previously seen left pleural fluid is no longer present.  Patchy airspace opacity in the left mid and lower lung zones.  Clear right lung. Right jugular porta-catheter, unchanged.  Enlarged cardiac silhouette.  Unremarkable bones.  IMPRESSION:  1.  Interval resolution of the left pleural fluid following chest tube placement. 2.  Patchy airspace opacity in the left mid and lower lung zones. This could represent pneumonia, re-expansion pulmonary edema or residual atelectasis. 3.  Cardiomegaly.   Original Report Authenticated By: Darrol Angel, M.D.     Assessment and Plan:  Large Left pleural effusion. Probable L parapneumonic effusion likely d/t PNA. S/p Pigtail cath placement on 9/19 Plan: - complete total 2 weeks antibiotic therapy, likely monotherapy w/ levaquin will be adequate     6 mm Lt upper lobe pulmonary nodule.  Incidental finding on CT chest 11/02/11. Plan: -Will need outpt follow up  Metastatic colon cancer. Plan: -Pt requesting referral to new oncologist>>defer decision to primary team.    CAD.  Chronic systolic CHF Hyperlipidemia.   Plan: -per primary team -[facility] following   Sandrea Hughs, MD Pulmonary and Critical Care Medicine [facility] Cell [phone]

## 2011-12-23 NOTE — Progress Notes (Signed)
Subjective: Left pleural effusion chest tube placed 9/19 All cxs and cyto neg Pt feels better; altho sweated a lot last night   Objective: Vital signs in last 24 hours: Temp:  [97.5 F (36.4 C)-98.3 F (36.8 C)] 98.3 F (36.8 C) (09/21 0415) Pulse Rate:  [76-95] 83  (09/21 0415) Resp:  [19-20] 19  (09/21 0415) BP: (98-118)/(65-81) 116/72 mmHg (09/21 0415) SpO2:  [96 %-100 %] 100 % (09/21 0415) Weight:  [193 lb 2 oz (87.6 kg)] 193 lb 2 oz (87.6 kg) (09/21 0415) Last BM Date: 12/22/11  Intake/Output from previous day: 09/20 0701 - 09/21 0700 In: 820 [P.O.:820] Out: 2450 [Urine:2400; Drains:50] Intake/Output this shift: Total I/O In: 120 [P.O.:120] Out: -   PE: afeb; VSS CXR this am shows chest tube in good position reaccumulation of small left effusion - atelectasis Site clean and dry tube intact Wbc: 11.9 725 cc in pleurvac; dark serous fluid  Lab Results:   [facility] 12/23/11 0624 12/22/11 0547  WBC 11.9* 9.8  HGB 11.8* 10.5*  HCT 36.6* 33.6*  PLT 439* 421*   BMET  Basename 12/23/11 0624 12/22/11 0547  NA 133* 136  K 4.1 4.2  CL 98 102  CO2 26 26  GLUCOSE 145* 142*  BUN 5* 7  CREATININE 0.46* 0.58  CALCIUM 9.0 8.7   PT/INR  Basename 12/21/11 0500  LABPROT 18.4*  INR 1.58*   ABG No results found for this basename: PHART:2,PCO2:2,PO2:2,HCO3:2 in the last 72 hours  Studies/Results: Ct Guided Abscess Drain  12/21/2011  *RADIOLOGY REPORT*  Clinical Data/Indication: LEFT PLEURAL EFFUSION  CT GUIDED ABCESS DRAINAGE WITH CATHETER  Sedation: Versed 2.0 mg, Fentanyl 100 mg.  Total Moderate Sedation Time: 21 minutes.  Procedure: The procedure, risks, benefits, and alternatives were explained to the patient. Questions regarding the procedure were encouraged and answered. The patient understands and consents to the procedure.  The left anterolateral chest was prepped with betadine in a sterile fashion, and a sterile drape was applied covering the operative  field. A sterile gown and sterile gloves were used for the procedure.  Under CT guidance, an 19 gauge needle was inserted into the left pleural space via left anterior axillary line approach.  It was removed over an 0038 wire. Successive 12 and 18-French dilators were inserted.  A 16-French Thal-Quik drain was then advanced over the wire coiled in the pleural fluid.  It was then attached to a Pleur-Evac at 20 cm water suction.  Findings: Imaging demonstrates placement of a left 16-French thoracostomy tube.  Complications: None.  IMPRESSION: Successful left  16-French thoracostomy tube placement.   Original Report Authenticated By: Donavan Burnet, M.D.    Dg Chest Port 1 View  12/23/2011  *RADIOLOGY REPORT*  Clinical Data: Evaluate left-sided chest tube  PORTABLE CHEST - 1 VIEW  Comparison: 12/22/2011; 12/21/2011; 12/19/2011; CT guided thoracostomy tube placement  Findings: Grossly unchanged large cardiac silhouette and mediastinal contours.  Stable position of support apparatus. Interval reaccumulation of a small left-sided pleural effusion and left mid and lower lung heterogeneous / consolidative opacities post left-sided thoracotomy tube placement. Increased right infrahilar heterogeneous opacities.  No pneumothorax.  Unchanged bones.  IMPRESSION: 1.  Stable positioning of support apparatus. No pneumothorax.  2.  Interval reaccumulation of small left-sided effusion with associated left mid and lower lung opacities, favored to represent atelectasis. 2.  Worsening right infrahilar opacities favored to represent atelectasis.   Original Report Authenticated By: Waynard Reeds, M.D.    Dg Chest [facility]  1 View  12/22/2011  *RADIOLOGY REPORT*  Clinical Data: Chest tube.  Shortness of breath.  PORTABLE CHEST - 1 VIEW  Comparison: Yesterday.  Findings: Interval small caliber left chest tube.  The previously seen left pleural fluid is no longer present.  Patchy airspace opacity in the left mid and lower lung zones.   Clear right lung. Right jugular porta-catheter, unchanged.  Enlarged cardiac silhouette.  Unremarkable bones.  IMPRESSION:  1.  Interval resolution of the left pleural fluid following chest tube placement. 2.  Patchy airspace opacity in the left mid and lower lung zones. This could represent pneumonia, re-expansion pulmonary edema or residual atelectasis. 3.  Cardiomegaly.   Original Report Authenticated By: Darrol Angel, M.D.     Anti-infectives:   Assessment/Plan: s/p left pleural chest tube placed 9/19 Pt breathing better cxr with atelectasis today  Lizvet Chunn A 12/23/2011

## 2011-12-23 NOTE — Progress Notes (Signed)
Subjective: Still having some pain around the chest tube insertion site.  Still draining fluid from chest tube.  Objective: Vital signs in last 24 hours: Filed Vitals:   12/22/11 0641 12/22/11 1428 12/22/11 2230 12/23/11 0415  BP: 95/59 98/65 118/81 116/72  Pulse: 83 76 95 83  Temp: 97.4 F (36.3 C) 97.6 F (36.4 C) 97.5 F (36.4 C) 98.3 F (36.8 C)  TempSrc: Oral Oral Oral Oral  Resp: 20 20 20 19   Height:      Weight: 88.3 kg (194 lb 10.7 oz)   87.6 kg (193 lb 2 oz)  SpO2: 98% 96% 100% 100%   Weight change: -0.7 kg (-1 lb 8.7 oz)  Intake/Output Summary (Last 24 hours) at 12/23/11 1317 Last data filed at 12/23/11 0939  Gross per 24 hour  Intake    480 ml  Output   1250 ml  Net   -770 ml    Physical Exam: General: Awake, Oriented, No acute distress. HEENT: EOMI. Neck: Supple CV: S1 and S2 Lungs: Diminished breath sounds at the left lung base. Abdomen: Soft, Nontender, Nondistended, +bowel sounds. Ext: Good pulses. Trace edema. Chest: Chest tube on the left.  Lab Results: Basic Metabolic Panel:  Lab 12/23/11 7829 12/22/11 0547 12/21/11 0500 12/19/11 0750 12/18/11 1832  NA 133* 136 132* 132* 136  K 4.1 4.2 4.1 4.0 4.6  CL 98 102 99 98 97  CO2 26 26 26 27 27   GLUCOSE 145* 142* 117* 123* 133*  BUN 5* 7 6 4* 5*  CREATININE 0.46* 0.58 0.66 0.61 0.61  CALCIUM 9.0 8.7 8.7 8.6 9.4  MG -- -- -- 2.1 --  PHOS -- -- -- -- --   Liver Function Tests:  Lab 12/18/11 1832  AST 12  ALT 5  ALKPHOS 77  BILITOT 0.3  PROT 8.3  ALBUMIN 2.7*   No results found for this basename: LIPASE:5,AMYLASE:5 in the last 168 hours No results found for this basename: AMMONIA:5 in the last 168 hours CBC:  Lab 12/23/11 0624 12/22/11 0547 12/21/11 0500 12/19/11 0750 12/18/11 1832  WBC 11.9* 9.8 10.4 12.6* 14.5*  NEUTROABS -- -- -- -- 11.7*  HGB 11.8* 10.5* 10.2* 10.2* 11.5*  HCT 36.6* 33.6* 32.0* 31.8* 35.7*  MCV 82.2 83.0 82.1 82.0 83.0  PLT 439* 421* 408* 417* 500*   Cardiac  Enzymes:  Lab 12/18/11 1841  CKTOTAL --  CKMB --  CKMBINDEX --  TROPONINI <0.30   BNP (last 3 results)  Basename 11/16/11 1150 11/01/11 1532  PROBNP 229.0* 1658.0*   CBG:  Lab 12/23/11 0605  GLUCAP 148*   No results found for this basename: HGBA1C:5 in the last 72 hours Other Labs: No components found with this basename: POCBNP:3 No results found for this basename: DDIMER:2 in the last 168 hours No results found for this basename: CHOL:2,HDL:2,LDLCALC:2,TRIG:2,CHOLHDL:2,LDLDIRECT:2 in the last 168 hours No results found for this basename: TSH,T4TOTAL,FREET3,T3FREE,FREET4,THYROIDAB in the last 168 hours No results found for this basename: VITAMINB12:2,FOLATE:2,FERRITIN:2,TIBC:2,IRON:2,RETICCTPCT:2 in the last 168 hours  Micro Results: Recent Results (from the past 240 hour(s))  CULTURE, BLOOD (ROUTINE X 2)     Status: Normal (Preliminary result)   Collection Time   12/18/11  8:54 PM      Component Value Range Status Comment   Specimen Description BLOOD ARM RIGHT   Final    Special Requests BOTTLES DRAWN AEROBIC AND ANAEROBIC 10CC   Final    Culture  Setup Time 12/19/2011 01:47   Final    Culture  Final    Value:        BLOOD CULTURE RECEIVED NO GROWTH TO DATE CULTURE WILL BE HELD FOR 5 DAYS BEFORE ISSUING A FINAL NEGATIVE REPORT   Report Status PENDING   Incomplete   CULTURE, BLOOD (ROUTINE X 2)     Status: Normal (Preliminary result)   Collection Time   12/18/11  9:08 PM      Component Value Range Status Comment   Specimen Description BLOOD HAND LEFT   Final    Special Requests BOTTLES DRAWN AEROBIC ONLY 6CC   Final    Culture  Setup Time 12/19/2011 01:47   Final    Culture     Final    Value:        BLOOD CULTURE RECEIVED NO GROWTH TO DATE CULTURE WILL BE HELD FOR 5 DAYS BEFORE ISSUING A FINAL NEGATIVE REPORT   Report Status PENDING   Incomplete   GRAM STAIN     Status: Normal   Collection Time   12/19/11 11:45 AM      Component Value Range Status Comment    Specimen Description PLEURAL FLUID LEFT   Final    Special Requests   Final    Gram Stain     Final    Value: RARE WBC PRESENT,BOTH PMN AND MONONUCLEAR     NO ORGANISMS SEEN   Report Status 12/19/2011 FINAL   Final   BODY FLUID CULTURE     Status: Normal   Collection Time   12/19/11 11:45 AM      Component Value Range Status Comment   Specimen Description PLEURAL FLUID LEFT   Final    Special Requests   Final    Gram Stain     Final    Value: RARE WBC PRESENT,BOTH PMN AND MONONUCLEAR     NO ORGANISMS SEEN     Performed at [facility]   Culture NO GROWTH 3 DAYS   Final    Report Status 12/22/2011 FINAL   Final   AFB CULTURE WITH SMEAR     Status: Normal (Preliminary result)   Collection Time   12/19/11 11:45 AM      Component Value Range Status Comment   Specimen Description PLEURAL FLUID LEFT   Final    Special Requests   Final    ACID FAST SMEAR NO ACID FAST BACILLI SEEN   Final    Culture     Final    Value: CULTURE WILL BE EXAMINED FOR 6 WEEKS BEFORE ISSUING A FINAL REPORT   Report Status PENDING   Incomplete   FUNGUS CULTURE W SMEAR     Status: Normal (Preliminary result)   Collection Time   12/19/11 11:45 AM      Component Value Range Status Comment   Specimen Description PLEURAL FLUID LEFT   Final    Special Requests   Final    Fungal Smear NO YEAST OR FUNGAL ELEMENTS SEEN   Final    Culture CULTURE IN PROGRESS FOR FOUR WEEKS   Final    Report Status PENDING   Incomplete   CULTURE, EXPECTORATED SPUTUM-ASSESSMENT     Status: Normal   Collection Time   12/19/11  2:07 PM      Component Value Range Status Comment   Specimen Description SPUTUM   Final    Special Requests NONE   Final    Sputum evaluation     Final    Value: MICROSCOPIC FINDINGS SUGGEST  THAT THIS SPECIMEN IS NOT REPRESENTATIVE OF LOWER RESPIRATORY SECRETIONS. PLEASE RECOLLECT.     CALLED TO RN J.SMITH AT 1533 BY L.PITT 12/19/11.   Report Status 12/19/2011 FINAL   Final      Studies/Results: Ct Guided Abscess Drain  12/21/2011  *RADIOLOGY REPORT*  Clinical Data/Indication: LEFT PLEURAL EFFUSION  CT GUIDED ABCESS DRAINAGE WITH CATHETER  Sedation: Versed 2.0 mg, Fentanyl 100 mg.  Total Moderate Sedation Time: 21 minutes.  Procedure: The procedure, risks, benefits, and alternatives were explained to the patient. Questions regarding the procedure were encouraged and answered. The patient understands and consents to the procedure.  The left anterolateral chest was prepped with betadine in a sterile fashion, and a sterile drape was applied covering the operative field. A sterile gown and sterile gloves were used for the procedure.  Under CT guidance, an 19 gauge needle was inserted into the left pleural space via left anterior axillary line approach.  It was removed over an 0038 wire. Successive 12 and 18-French dilators were inserted.  A 16-French Thal-Quik drain was then advanced over the wire coiled in the pleural fluid.  It was then attached to a Pleur-Evac at 20 cm water suction.  Findings: Imaging demonstrates placement of a left 16-French thoracostomy tube.  Complications: None.  IMPRESSION: Successful left  16-French thoracostomy tube placement.   Original Report Authenticated By: Donavan Burnet, M.D.    Dg Chest Port 1 View  12/23/2011  *RADIOLOGY REPORT*  Clinical Data: Evaluate left-sided chest tube  PORTABLE CHEST - 1 VIEW  Comparison: 12/22/2011; 12/21/2011; 12/19/2011; CT guided thoracostomy tube placement  Findings: Grossly unchanged large cardiac silhouette and mediastinal contours.  Stable position of support apparatus. Interval reaccumulation of a small left-sided pleural effusion and left mid and lower lung heterogeneous / consolidative opacities post left-sided thoracotomy tube placement. Increased right infrahilar heterogeneous opacities.  No pneumothorax.  Unchanged bones.  IMPRESSION: 1.  Stable positioning of support apparatus. No pneumothorax.  2.  Interval  reaccumulation of small left-sided effusion with associated left mid and lower lung opacities, favored to represent atelectasis. 2.  Worsening right infrahilar opacities favored to represent atelectasis.   Original Report Authenticated By: Waynard Reeds, M.D.    Dg Chest Port 1 View  12/22/2011  *RADIOLOGY REPORT*  Clinical Data: Chest tube.  Shortness of breath.  PORTABLE CHEST - 1 VIEW  Comparison: Yesterday.  Findings: Interval small caliber left chest tube.  The previously seen left pleural fluid is no longer present.  Patchy airspace opacity in the left mid and lower lung zones.  Clear right lung. Right jugular porta-catheter, unchanged.  Enlarged cardiac silhouette.  Unremarkable bones.  IMPRESSION:  1.  Interval resolution of the left pleural fluid following chest tube placement. 2.  Patchy airspace opacity in the left mid and lower lung zones. This could represent pneumonia, re-expansion pulmonary edema or residual atelectasis. 3.  Cardiomegaly.   Original Report Authenticated By: Darrol Angel, M.D.     Medications: I have reviewed the patient's current medications. Scheduled Meds:    . aspirin EC  81 mg Oral Daily  . levofloxacin  750 mg Oral QHS  . pantoprazole  40 mg Oral Daily  . simvastatin  10 mg Oral QHS  . sodium chloride  3 mL Intravenous Q12H  . vitamin B-12  1,000 mcg Oral Daily  . vitamin C  500 mg Oral Daily  . DISCONTD: ceFEPime (MAXIPIME) IV  1 g Intravenous Q8H   Continuous Infusions:  PRN Meds:.albuterol, benzonatate, guaiFENesin-dextromethorphan, HYDROcodone-acetaminophen, HYDROmorphone (DILAUDID) injection, morphine injection, prochlorperazine, sodium chloride  Assessment/Plan: Pleural effusion Fluid is exudative, cytology was negative for any malignant cells, had thoracentesis on 12/19/2011 with another thoracentesis on 12/22/2011 with chest tube placement.  Pulmonary following. Pleural effusion likely due to pneumonia.  Discontinue Vancomycin on 12/21/2011.  Discontinue cefepime on 12/22/2011. Continue levofloxacin for ~2 weeks total, antibiotics are 12/19/2011. CT angiogram on 12/18/2011, negative for pulmonary embolism, marked increase in left pleural effusion, new small pericardial effusion. AFB cultures negative for AFB so far, body fluid culture negative so far, fungal culture negative so far, blood cultures x2 no growth to date.  Nonsustained ventricular tachycardia Patient is not a candidate for ICD implantation.  No further workup per cardiology.  Hypotension Stable.  Not on any antihypertensive medications.  Leukocytosis Improve.  Continue to monitor.  Hyponatremia Continue to monitor.  Maroon-colored stools Hemoglobin stable.  Continue to monitor. Stool occult pending.  Chest pain Pleuritic, likely due to cough.  Stable.  Rectal cancer stage IIIB with metastases to abdomen/peritoneum Completed chemotherapy in October of 2012, patient currently being followed by Oncologist in [place], [place]. He would like to establish with oncology in [place], [place]. Patient scheduled an appointment with Dr. Mariel Sleet on 01/01/2012 at 11:30 AM.  6 mm Lt upper lobe pulmonary nodule Incidental finding on CT chest 11/02/11. Will need outpatient followup.  Hyperlipidemia Continue statin.  Coronary artery disease Resumed aspirin today.  Prophylaxis SCDs.  Disposition Pending, pulmonary workup and removal of chest tube.   LOS: 5 days  Panhia Karl A, MD 12/23/2011, 1:17 PM

## 2011-12-24 LAB — CBC
HCT: 37.6 % — ABNORMAL LOW (ref 39.0–52.0)
MCH: 25.7 pg — ABNORMAL LOW (ref 26.0–34.0)
MCV: 82.5 fL (ref 78.0–100.0)
Platelets: 493 10*3/uL — ABNORMAL HIGH (ref 150–400)
RBC: 4.56 MIL/uL (ref 4.22–5.81)
RDW: 14.1 % (ref 11.5–15.5)

## 2011-12-24 LAB — BASIC METABOLIC PANEL
BUN: 5 mg/dL — ABNORMAL LOW (ref 6–23)
CO2: 29 mEq/L (ref 19–32)
Calcium: 9.4 mg/dL (ref 8.4–10.5)
Creatinine, Ser: 0.54 mg/dL (ref 0.50–1.35)

## 2011-12-24 LAB — GLUCOSE, CAPILLARY: Glucose-Capillary: 128 mg/dL — ABNORMAL HIGH (ref 70–99)

## 2011-12-24 NOTE — Progress Notes (Signed)
Subjective: Pain better controlled. Will need to get oob to chair. Still draining fluid from chest tube.  Objective: Vital signs in last 24 hours: Filed Vitals:   12/23/11 0415 12/23/11 1442 12/23/11 2209 12/24/11 0549  BP: 116/72 116/80 106/74 103/67  Pulse: 83 85 91 87  Temp: 98.3 F (36.8 C) 98 F (36.7 C) 98.4 F (36.9 C) 97.4 F (36.3 C)  TempSrc: Oral Oral Oral Oral  Resp: 19 19 18 20   Height:      Weight: 87.6 kg (193 lb 2 oz)   83.553 kg (184 lb 3.2 oz)  SpO2: 100% 100% 100% 99%   Weight change: -4.048 kg (-8 lb 14.8 oz)  Intake/Output Summary (Last 24 hours) at 12/24/11 0917 Last data filed at 12/24/11 0700  Gross per 24 hour  Intake   1260 ml  Output   2150 ml  Net   -890 ml    Physical Exam: General: Awake, Oriented, No acute distress. HEENT: EOMI. Neck: Supple CV: S1 and S2 Lungs: Diminished breath sounds at the left lung base. Abdomen: Soft, Nontender, Nondistended, +bowel sounds. Ext: Good pulses. Trace edema. Chest: Chest tube on the left.  Lab Results: Basic Metabolic Panel:  Lab 12/24/11 1610 12/23/11 0624 12/22/11 0547 12/21/11 0500 12/19/11 0750  NA 133* 133* 136 132* 132*  K 4.0 4.1 4.2 4.1 4.0  CL 95* 98 102 99 98  CO2 29 26 26 26 27   GLUCOSE 126* 145* 142* 117* 123*  BUN 5* 5* 7 6 4*  CREATININE 0.54 0.46* 0.58 0.66 0.61  CALCIUM 9.4 9.0 8.7 8.7 8.6  MG -- -- -- -- 2.1  PHOS -- -- -- -- --   Liver Function Tests:  Lab 12/18/11 1832  AST 12  ALT 5  ALKPHOS 77  BILITOT 0.3  PROT 8.3  ALBUMIN 2.7*   No results found for this basename: LIPASE:5,AMYLASE:5 in the last 168 hours No results found for this basename: AMMONIA:5 in the last 168 hours CBC:  Lab 12/24/11 0545 12/23/11 0624 12/22/11 0547 12/21/11 0500 12/19/11 0750 12/18/11 1832  WBC 10.4 11.9* 9.8 10.4 12.6* --  NEUTROABS -- -- -- -- -- 11.7*  HGB 11.7* 11.8* 10.5* 10.2* 10.2* --  HCT 37.6* 36.6* 33.6* 32.0* 31.8* --  MCV 82.5 82.2 83.0 82.1 82.0 --  PLT 493* 439*  421* 408* 417* --   Cardiac Enzymes:  Lab 12/18/11 1841  CKTOTAL --  CKMB --  CKMBINDEX --  TROPONINI <0.30   BNP (last 3 results)  Basename 11/16/11 1150 11/01/11 1532  PROBNP 229.0* 1658.0*   CBG:  Lab 12/24/11 0636 12/23/11 0605  GLUCAP 128* 148*   No results found for this basename: HGBA1C:5 in the last 72 hours Other Labs: No components found with this basename: POCBNP:3 No results found for this basename: DDIMER:2 in the last 168 hours No results found for this basename: CHOL:2,HDL:2,LDLCALC:2,TRIG:2,CHOLHDL:2,LDLDIRECT:2 in the last 168 hours No results found for this basename: TSH,T4TOTAL,FREET3,T3FREE,FREET4,THYROIDAB in the last 168 hours No results found for this basename: VITAMINB12:2,FOLATE:2,FERRITIN:2,TIBC:2,IRON:2,RETICCTPCT:2 in the last 168 hours  Micro Results: Recent Results (from the past 240 hour(s))  CULTURE, BLOOD (ROUTINE X 2)     Status: Normal (Preliminary result)   Collection Time   12/18/11  8:54 PM      Component Value Range Status Comment   Specimen Description BLOOD ARM RIGHT   Final    Special Requests BOTTLES DRAWN AEROBIC AND ANAEROBIC 10CC   Final    Culture  Setup Time  12/19/2011 01:47   Final    Culture     Final    Value:        BLOOD CULTURE RECEIVED NO GROWTH TO DATE CULTURE WILL BE HELD FOR 5 DAYS BEFORE ISSUING A FINAL NEGATIVE REPORT   Report Status PENDING   Incomplete   CULTURE, BLOOD (ROUTINE X 2)     Status: Normal (Preliminary result)   Collection Time   12/18/11  9:08 PM      Component Value Range Status Comment   Specimen Description BLOOD HAND LEFT   Final    Special Requests BOTTLES DRAWN AEROBIC ONLY 6CC   Final    Culture  Setup Time 12/19/2011 01:47   Final    Culture     Final    Value:        BLOOD CULTURE RECEIVED NO GROWTH TO DATE CULTURE WILL BE HELD FOR 5 DAYS BEFORE ISSUING A FINAL NEGATIVE REPORT   Report Status PENDING   Incomplete   GRAM STAIN     Status: Normal   Collection Time   12/19/11 11:45 AM       Component Value Range Status Comment   Specimen Description PLEURAL FLUID LEFT   Final    Special Requests   Final    Gram Stain     Final    Value: RARE WBC PRESENT,BOTH PMN AND MONONUCLEAR     NO ORGANISMS SEEN   Report Status 12/19/2011 FINAL   Final   BODY FLUID CULTURE     Status: Normal   Collection Time   12/19/11 11:45 AM      Component Value Range Status Comment   Specimen Description PLEURAL FLUID LEFT   Final    Special Requests   Final    Gram Stain     Final    Value: RARE WBC PRESENT,BOTH PMN AND MONONUCLEAR     NO ORGANISMS SEEN     Performed at [facility]   Culture NO GROWTH 3 DAYS   Final    Report Status 12/22/2011 FINAL   Final   AFB CULTURE WITH SMEAR     Status: Normal (Preliminary result)   Collection Time   12/19/11 11:45 AM      Component Value Range Status Comment   Specimen Description PLEURAL FLUID LEFT   Final    Special Requests   Final    ACID FAST SMEAR NO ACID FAST BACILLI SEEN   Final    Culture     Final    Value: CULTURE WILL BE EXAMINED FOR 6 WEEKS BEFORE ISSUING A FINAL REPORT   Report Status PENDING   Incomplete   FUNGUS CULTURE W SMEAR     Status: Normal (Preliminary result)   Collection Time   12/19/11 11:45 AM      Component Value Range Status Comment   Specimen Description PLEURAL FLUID LEFT   Final    Special Requests   Final    Fungal Smear NO YEAST OR FUNGAL ELEMENTS SEEN   Final    Culture CULTURE IN PROGRESS FOR FOUR WEEKS   Final    Report Status PENDING   Incomplete   CULTURE, EXPECTORATED SPUTUM-ASSESSMENT     Status: Normal   Collection Time   12/19/11  2:07 PM      Component Value Range Status Comment   Specimen Description SPUTUM   Final    Special Requests NONE   Final    Sputum  evaluation     Final    Value: MICROSCOPIC FINDINGS SUGGEST THAT THIS SPECIMEN IS NOT REPRESENTATIVE OF LOWER RESPIRATORY SECRETIONS. PLEASE RECOLLECT.     CALLED TO RN J.SMITH AT 1533 BY L.PITT 12/19/11.   Report  Status 12/19/2011 FINAL   Final     Studies/Results: Dg Chest Port 1 View  12/23/2011  *RADIOLOGY REPORT*  Clinical Data: Evaluate left-sided chest tube  PORTABLE CHEST - 1 VIEW  Comparison: 12/22/2011; 12/21/2011; 12/19/2011; CT guided thoracostomy tube placement  Findings: Grossly unchanged large cardiac silhouette and mediastinal contours.  Stable position of support apparatus. Interval reaccumulation of a small left-sided pleural effusion and left mid and lower lung heterogeneous / consolidative opacities post left-sided thoracotomy tube placement. Increased right infrahilar heterogeneous opacities.  No pneumothorax.  Unchanged bones.  IMPRESSION: 1.  Stable positioning of support apparatus. No pneumothorax.  2.  Interval reaccumulation of small left-sided effusion with associated left mid and lower lung opacities, favored to represent atelectasis. 2.  Worsening right infrahilar opacities favored to represent atelectasis.   Original Report Authenticated By: Waynard Reeds, M.D.    Dg Chest Port 1 View  12/22/2011  *RADIOLOGY REPORT*  Clinical Data: Chest tube.  Shortness of breath.  PORTABLE CHEST - 1 VIEW  Comparison: Yesterday.  Findings: Interval small caliber left chest tube.  The previously seen left pleural fluid is no longer present.  Patchy airspace opacity in the left mid and lower lung zones.  Clear right lung. Right jugular porta-catheter, unchanged.  Enlarged cardiac silhouette.  Unremarkable bones.  IMPRESSION:  1.  Interval resolution of the left pleural fluid following chest tube placement. 2.  Patchy airspace opacity in the left mid and lower lung zones. This could represent pneumonia, re-expansion pulmonary edema or residual atelectasis. 3.  Cardiomegaly.   Original Report Authenticated By: Darrol Angel, M.D.     Medications: I have reviewed the patient's current medications. Scheduled Meds:    . aspirin EC  81 mg Oral Daily  . levofloxacin  750 mg Oral QHS  . pantoprazole   40 mg Oral Daily  . simvastatin  10 mg Oral QHS  . sodium chloride  3 mL Intravenous Q12H  . vitamin B-12  1,000 mcg Oral Daily  . vitamin C  500 mg Oral Daily   Continuous Infusions:   PRN Meds:.albuterol, benzonatate, guaiFENesin-dextromethorphan, HYDROcodone-acetaminophen, HYDROmorphone (DILAUDID) injection, morphine injection, prochlorperazine, sodium chloride  Assessment/Plan: Pleural effusion: will repeat CXR IN AM. Fluid is exudative, cytology was negative for any malignant cells, had thoracentesis on 12/19/2011 with another thoracentesis on 12/22/2011 with chest tube placement.  Pulmonary following. Pleural effusion likely due to pneumonia.  Discontinue Vancomycin on 12/21/2011. Discontinue cefepime on 12/22/2011. Continue levofloxacin for ~2 weeks total, antibiotics are 12/19/2011. CT angiogram on 12/18/2011, negative for pulmonary embolism, marked increase in left pleural effusion, new small pericardial effusion. AFB cultures negative for AFB so far, body fluid culture negative so far, fungal culture negative so far, blood cultures x2 no growth to date.  Nonsustained ventricular tachycardia Patient is not a candidate for ICD implantation.  No further workup per cardiology.  Hypotension Stable.  Not on any antihypertensive medications.  Leukocytosis resolved.  Continue to monitor.  Hyponatremia Continue to monitor.  Maroon-colored stools Hemoglobin stable.  Continue to monitor. Stool occult pending.  Chest pain Pleuritic, likely due to cough.  Stable.  Rectal cancer stage IIIB with metastases to abdomen/peritoneum Completed chemotherapy in October of 2012, patient currently being followed by Oncologist in [place],  [place]. He would like to establish with oncology in [place], [place]. Patient scheduled an appointment with Dr. Mariel Sleet on 01/01/2012 at 11:30 AM.  6 mm Lt upper lobe pulmonary nodule Incidental finding on CT chest 11/02/11. Will need outpatient  followup.  Hyperlipidemia Continue statin.  Coronary artery disease Resumed aspirin.  Prophylaxis SCDs.  Disposition Pending, pulmonary workup and removal of chest tube.   LOS: 6 days  Cliford Sequeira, MD 12/24/2011, 9:17 AM

## 2011-12-24 NOTE — Progress Notes (Signed)
  Subjective: Left chest drain placed 9/19 850 cc in pleurvac today "better"  Objective: Vital signs in last 24 hours: Temp:  [97.4 F (36.3 C)-98.4 F (36.9 C)] 97.4 F (36.3 C) (09/22 0549) Pulse Rate:  [85-91] 87  (09/22 0549) Resp:  [18-20] 20  (09/22 0549) BP: (103-116)/(67-80) 103/67 mmHg (09/22 0549) SpO2:  [99 %-100 %] 99 % (09/22 0549) Weight:  [184 lb 3.2 oz (83.553 kg)] 184 lb 3.2 oz (83.553 kg) (09/22 0549) Last BM Date: 12/23/11  Intake/Output from previous day: 09/21 0701 - 09/22 0700 In: 1260 [P.O.:900; I.V.:360] Out: 2150 [Urine:2075; Drains:75] Intake/Output this shift:    PE: site of chest tube clean and dry; nt No cxr this am Afeb; vss 850 cc in pleurvac (725 yesterday) Breathing quietly Wbc 10.4  Lab Results:   Basename 12/24/11 0545 12/23/11 0624  WBC 10.4 11.9*  HGB 11.7* 11.8*  HCT 37.6* 36.6*  PLT 493* 439*   BMET  Basename 12/24/11 0545 12/23/11 0624  NA 133* 133*  K 4.0 4.1  CL 95* 98  CO2 29 26  GLUCOSE 126* 145*  BUN 5* 5*  CREATININE 0.54 0.46*  CALCIUM 9.4 9.0   PT/INR No results found for this basename: LABPROT:2,INR:2 in the last 72 hours ABG No results found for this basename: PHART:2,PCO2:2,PO2:2,HCO3:2 in the last 72 hours  Studies/Results: Dg Chest Port 1 View  12/23/2011  *RADIOLOGY REPORT*  Clinical Data: Evaluate left-sided chest tube  PORTABLE CHEST - 1 VIEW  Comparison: 12/22/2011; 12/21/2011; 12/19/2011; CT guided thoracostomy tube placement  Findings: Grossly unchanged large cardiac silhouette and mediastinal contours.  Stable position of support apparatus. Interval reaccumulation of a small left-sided pleural effusion and left mid and lower lung heterogeneous / consolidative opacities post left-sided thoracotomy tube placement. Increased right infrahilar heterogeneous opacities.  No pneumothorax.  Unchanged bones.  IMPRESSION: 1.  Stable positioning of support apparatus. No pneumothorax.  2.  Interval  reaccumulation of small left-sided effusion with associated left mid and lower lung opacities, favored to represent atelectasis. 2.  Worsening right infrahilar opacities favored to represent atelectasis.   Original Report Authenticated By: Waynard Reeds, M.D.    Dg Chest Port 1 View  12/22/2011  *RADIOLOGY REPORT*  Clinical Data: Chest tube.  Shortness of breath.  PORTABLE CHEST - 1 VIEW  Comparison: Yesterday.  Findings: Interval small caliber left chest tube.  The previously seen left pleural fluid is no longer present.  Patchy airspace opacity in the left mid and lower lung zones.  Clear right lung. Right jugular porta-catheter, unchanged.  Enlarged cardiac silhouette.  Unremarkable bones.  IMPRESSION:  1.  Interval resolution of the left pleural fluid following chest tube placement. 2.  Patchy airspace opacity in the left mid and lower lung zones. This could represent pneumonia, re-expansion pulmonary edema or residual atelectasis. 3.  Cardiomegaly.   Original Report Authenticated By: Darrol Angel, M.D.     Anti-infectives:   Assessment/Plan: s/p left chest tube placed 9/19 Intact Draining Will follow  Laporsha Grealish A 12/24/2011

## 2011-12-24 NOTE — Progress Notes (Signed)
Name: Adam Henderson MRN: [id number] DOB: [birth year]    LOS: 6 Consulting MD:  Dr. Betti Cruz Reason for Consult:  Pleural Effusion Eval   Brief patient profile:   56 yowm admitted 12/18/2011 dyspnea, night sweats, dry cough and chest pain.  Had several weeks of outpt tx for PNA.  Found to have large Lt pleural effusion and PCCM consulted 9/17.  Has hx of Stage IIIb rectal cancer s/p resection and s/p completion of chemo October 2012.  Found to have pelvic recurrence 12/05/11.  PMHx CAD, Hyperlipidemia, Systolic CHF        Cultures / Cytology: BC x 2 9/16>>> Fungal / AFB 9/17>>> Pleural Culture 9/17 > neg Pleural Cytology>>>reactive mesothelial cells, negative for malignancy  Antibiotics: Cefepime 9/16 (?CAP)> 9/20 levaquin 9/16>>>  Tests / Events: CT chest 9/16>>Large left pleural effusion, compressive ATX, small pericardial effusion, coronary calcification, cardiomegaly Lt thoracentesis 9/17>>1500 ml fluid, protein 5.4, LDH 161, glucose 137, 537 WBC (60N, 13L, 25M, E3) L pigtail 9/19 >>  Subjective No sob, pain better    Vital Signs: BP 103/67  Pulse 87  Temp 97.4 F (36.3 C) (Oral)  Resp 20  Ht 5\' 9"  (1.753 m)  Wt 184 lb 3.2 oz (83.553 kg)  BMI 27.20 kg/m2  SpO2 99% FIO2  2lpm  Intake/Output Summary (Last 24 hours) at 12/24/11 0956 Last data filed at 12/24/11 0700  Gross per 24 hour  Intake   1140 ml  Output   1725 ml  Net   -585 ml      Physical Examination: General:  Chronically ill appearing white male.   Neuro:  Alert and oriented w/out focal def HEENT:  [place], no VJD Cardiovascular:  rrr Lungs:  Clear bilaterally Abdomen:  Non-tender Musculoskeletal:  intact Skin: intact    Labs and Imaging:   Lab 12/24/11 0545 12/23/11 0624 12/22/11 0547  NA 133* 133* 136  K 4.0 4.1 4.2  CL 95* 98 102  CO2 29 26 26   BUN 5* 5* 7  CREATININE 0.54 0.46* 0.58  GLUCOSE 126* 145* 142*    Lab 12/24/11 0545 12/23/11 0624 12/22/11 0547  HGB 11.7* 11.8*  10.5*  HCT 37.6* 36.6* 33.6*  WBC 10.4 11.9* 9.8  PLT 493* 439* 421*    Lab 12/24/11 0545 12/23/11 0624 12/22/11 0547 12/21/11 0500  PROCALCITON -- -- -- <0.10  WBC 10.4 11.9* 9.8 10.4  LATICACIDVEN -- -- -- --    Dg Chest Port 1 View  12/23/2011  *RADIOLOGY REPORT*  Clinical Data: Evaluate left-sided chest tube  PORTABLE CHEST - 1 VIEW  Comparison: 12/22/2011; 12/21/2011; 12/19/2011; CT guided thoracostomy tube placement  Findings: Grossly unchanged large cardiac silhouette and mediastinal contours.  Stable position of support apparatus. Interval reaccumulation of a small left-sided pleural effusion and left mid and lower lung heterogeneous / consolidative opacities post left-sided thoracotomy tube placement. Increased right infrahilar heterogeneous opacities.  No pneumothorax.  Unchanged bones.  IMPRESSION: 1.  Stable positioning of support apparatus. No pneumothorax.  2.  Interval reaccumulation of small left-sided effusion with associated left mid and lower lung opacities, favored to represent atelectasis. 2.  Worsening right infrahilar opacities favored to represent atelectasis.   Original Report Authenticated By: Waynard Reeds, M.D.    Dg Chest Port 1 View  12/22/2011  *RADIOLOGY REPORT*  Clinical Data: Chest tube.  Shortness of breath.  PORTABLE CHEST - 1 VIEW  Comparison: Yesterday.  Findings: Interval small caliber left chest tube.  The previously seen  left pleural fluid is no longer present.  Patchy airspace opacity in the left mid and lower lung zones.  Clear right lung. Right jugular porta-catheter, unchanged.  Enlarged cardiac silhouette.  Unremarkable bones.  IMPRESSION:  1.  Interval resolution of the left pleural fluid following chest tube placement. 2.  Patchy airspace opacity in the left mid and lower lung zones. This could represent pneumonia, re-expansion pulmonary edema or residual atelectasis. 3.  Cardiomegaly.   Original Report Authenticated By: Darrol Angel, M.D.      Assessment and Plan:  Large Left pleural effusion. Probable L parapneumonic effusion likely d/t PNA. S/p Pigtail cath placement on 9/19 Plan: - complete total 2 weeks antibiotic therapy, likely monotherapy w/ levaquin will be adequate     6 mm Lt upper lobe pulmonary nodule.  Incidental finding on CT chest 11/02/11. Plan: -Will need outpt follow up  Metastatic colon cancer. Plan: -Pt requesting referral to new oncologist>>defer decision to primary team.    CAD.  Chronic systolic CHF Hyperlipidemia.   Plan: -per primary team -[facility] following   Sandrea Hughs, MD Pulmonary and Critical Care Medicine [facility] Cell [phone]

## 2011-12-25 DIAGNOSIS — R6510 Systemic inflammatory response syndrome (SIRS) of non-infectious origin without acute organ dysfunction: Secondary | ICD-10-CM

## 2011-12-25 DIAGNOSIS — I2510 Atherosclerotic heart disease of native coronary artery without angina pectoris: Secondary | ICD-10-CM

## 2011-12-25 LAB — CULTURE, BLOOD (ROUTINE X 2)

## 2011-12-25 MED ORDER — ENOXAPARIN SODIUM 40 MG/0.4ML ~~LOC~~ SOLN
40.0000 mg | SUBCUTANEOUS | Status: DC
  Administered 2011-12-25 – 2011-12-28 (×3): 40 mg via SUBCUTANEOUS
  Filled 2011-12-25 (×4): qty 0.4

## 2011-12-25 NOTE — Progress Notes (Signed)
Name: Adam Henderson MRN: [id number] DOB: [birth year]   Date of admit: 12/18/2011   LOS: 7 Consulting MD:  Dr. Betti Cruz Reason for Consult:  Pleural Effusion Eval   Brief patient profile:   56 yowm admitted 12/18/2011 dyspnea, night sweats, dry cough and chest pain.  Had several weeks of outpt tx for PNA.  Found to have large Lt pleural effusion and PCCM consulted 9/17. Has hx of Stage IIIb rectal cancer s/p resection and s/p completion of chemo October 2012.  Found to have pelvic recurrence 12/05/11. PMHx CAD, Hyperlipidemia, Systolic CHF        Cultures / Cytology: BC x 2 9/16>>> Fungal / AFB 9/17>>> Pleural Culture 9/17 > neg Pleural Cytology>>>reactive mesothelial cells, negative for malignancy  Antibiotics: Cefepime 9/16 (?CAP)> 9/20 levaquin 9/16>>>  Tests / Events: CTAbd lung cut - no effusion CT chest 11/02/11 -small left pleural effusion CT abdomen 12/05/11 - increassing left pleural effusion CT chest 9/16>>Large left pleural effusion, compressive ATX, small pericardial effusion, coronary calcification, cardiomegaly Lt thoracentesis 9/17>>1500 ml fluid, protein 5.4, LDH 161, glucose 137, 537 WBC (60N, 13L, 8M, E3) L pigtail 9/19 >>    SUBJECTIVE/OVERNIGHT/INTERVAL HX No sob, pain better  LEft chest tube still draining    Vital Signs: BP 106/67  Pulse 96  Temp 97.5 F (36.4 C) (Oral)  Resp 20  Ht 5\' 9"  (1.753 m)  Wt 83.2 kg (183 lb 6.8 oz)  BMI 27.09 kg/m2  SpO2 99% FIO2  2lpm  Intake/Output Summary (Last 24 hours) at 12/25/11 1154 Last data filed at 12/25/11 1135  Gross per 24 hour  Intake    360 ml  Output   2875 ml  Net  -2515 ml      Physical Examination: General:  Chronically ill appearing white male.   Neuro:  Alert and oriented w/out focal def HEENT:  [place], no VJD Cardiovascular:  rrr Lungs:  Clear bilaterally Abdomen:  Non-tender Musculoskeletal:  intact Skin: intact    Labs and Imaging:   Lab 12/24/11 0545 12/23/11 0624 12/22/11  0547  NA 133* 133* 136  K 4.0 4.1 4.2  CL 95* 98 102  CO2 29 26 26   BUN 5* 5* 7  CREATININE 0.54 0.46* 0.58  GLUCOSE 126* 145* 142*    Lab 12/24/11 0545 12/23/11 0624 12/22/11 0547  HGB 11.7* 11.8* 10.5*  HCT 37.6* 36.6* 33.6*  WBC 10.4 11.9* 9.8  PLT 493* 439* 421*    Lab 12/24/11 0545 12/23/11 0624 12/22/11 0547 12/21/11 0500  PROCALCITON -- -- -- <0.10  WBC 10.4 11.9* 9.8 10.4  LATICACIDVEN -- -- -- --    No results found.   No results found.  Assessment and Plan:  Large Left pleural effusion. Probable L parapneumonic effusion likely d/t PNA. S/p Pigtail cath placement on 9/19  - on 12/25/11: Left pigtail still draning. Given cancer recurrecne and serial radioloogy from July not showing clear cut pneumonia and effusion being large and chest tube still draining. We are moving malignant effusion to higher on list (cytology insensitive)  Plan: - complete total 2 weeks antibiotic therapy, likely monotherapy w/ levaquin will be adequate  - ultimatley if drainage does not stop, or effusion recurs will need VATS eval    6 mm Lt upper lobe pulmonary nodule.  Incidental finding on CT chest 11/02/11. Plan: -Will need outpt follow up  Metastatic colon cancer. Plan: -Pt requesting referral to new oncologist>>defer decision to primary team.    CAD.  Chronic  systolic CHF Hyperlipidemia.   Plan: -per primary team -[facility] following    D/w Dr Lucrezia Starch  Dr. Kalman Shan, M.D., [facility].C.P Pulmonary and Critical Care Medicine Staff Physician [facility] [place] [facility] Pager: [phone], If no answer or between  15:00h - 7:00h: call [phone]  12/25/2011 12:04 PM

## 2011-12-25 NOTE — Progress Notes (Signed)
Subjective: Pt ok, breathing better, feels better Mildly sore at chest tube site  Objective: Physical Exam: BP 101/64  Pulse 95  Temp 97.8 F (36.6 C) (Oral)  Resp 18  Ht 5\' 9"  (1.753 m)  Wt 183 lb 6.8 oz (83.2 kg)  BMI 27.09 kg/m2  SpO2 100% (L)chest tube intact, site clean, nontender. Output serous, 175cc recorded output since yesterday am.    Labs: CBC  Basename 12/24/11 0545 12/23/11 0624  WBC 10.4 11.9*  HGB 11.7* 11.8*  HCT 37.6* 36.6*  PLT 493* 439*   BMET  Basename 12/24/11 0545 12/23/11 0624  NA 133* 133*  K 4.0 4.1  CL 95* 98  CO2 29 26  GLUCOSE 126* 145*  BUN 5* 5*  CREATININE 0.54 0.46*  CALCIUM 9.4 9.0   LFT No results found for this basename: PROT,ALBUMIN,AST,ALT,ALKPHOS,BILITOT,BILIDIR,IBILI,LIPASE in the last 72 hours PT/INR No results found for this basename: LABPROT:2,INR:2 in the last 72 hours   Studies/Results: No results found.  Assessment/Plan: s/p left chest tube placed 9/19  Intact  Draining  Will follow   LOS: 7 days    Brayton El PA-C 12/25/2011 10:13 AM

## 2011-12-25 NOTE — Progress Notes (Signed)
PATIENT DETAILS Name: Adam Henderson Age: 56 y.o. Sex: male Date of Birth: [birth year] Admit Date: 12/18/2011 Admitting Physician Hollice Espy, MD ION:GEXBM, Dorinda Hill, MD  Subjective: No major complaints overnight.  Assessment/Plan: Principal Problem:  *Pleural effusion -exudative-given prior h/o PNA-suspicous of para pneumonic effusion, however no obvious PNA seen on prior radiologic studies. Chest tube already in-once removed-and if pleural effusion recurs-will need a VATS procedure to make sure this is not malignant-given h/o colorectal malignancy. Although cytology negative for malignant cells. CT Chest on 9/16-no PE, no obvious parenchymal or pleural lesions.Pleural Fluid cultures neg so far, including AFB smear. -PCCM and IR following -Continue levofloxacin for ~2 weeks total  Active Problems: Nonsustained ventricular tachycardia  Patient is not a candidate for ICD implantation. No further workup per cardiology.  Rectal cancer stage IIIB with metastases to abdomen/peritoneum  -Completed chemotherapy in October of 2012, patient currently being followed by Oncologist in [place], [place]. He would like to establish with oncology in [place], [place]. Patient scheduled an appointment with Dr. Mariel Sleet on 01/01/2012 at 11:30 AM. -Suspicion for malignant pleural effusion-if pleural effusion recurs after removal of the chest tube-will need to consult [facility] for a VATS procedure  6 mm Lt upper lobe pulmonary nodule  Incidental finding on CT chest 11/02/11. Will need outpatient followup.  Dyslipidemia -c/w statin   SIRS (systemic inflammatory response syndrome) -resolved   Hypotension -resolved, BP stilL soft  Coronary artery disease  - aspirin.  Chronic Systolic Heart Failure -compensated clinically -Effusion-exudate, not releated to CHF  Disposition: Remain inpatient  DVT Prophylaxis: Prophylactic Lovenox   Code Status: Full code   Procedures:  Thoracocentesis 9/17  Left  chest tube    9/19  CONSULTS:  cardiology and pulmonary/intensive care  PHYSICAL EXAM: Vital signs in last 24 hours: Filed Vitals:   12/25/11 0459 12/25/11 0756 12/25/11 0958 12/25/11 1135  BP: 102/79 104/66 101/64 106/67  Pulse: 95 91 95 96  Temp: 98 F (36.7 C) 97.8 F (36.6 C)  97.5 F (36.4 C)  TempSrc: Oral Oral  Oral  Resp: 20 20 18 20   Height:      Weight: 83.2 kg (183 lb 6.8 oz)     SpO2: 98% 98% 100% 99%    Weight change: -0.353 kg (-12.4 oz) Body mass index is 27.09 kg/(m^2).   Gen Exam: Awake and alert with clear speech.   Neck: Supple, No JVD.   Chest: B/L Clear.   CVS: S1 S2 Regular, no murmurs.  Abdomen: soft, BS +, non tender, non distended.  Extremities: no edema, lower extremities warm to touch. Neurologic: Non Focal.   Skin: No Rash.   Wounds: N/A.    Intake/Output from previous day:  Intake/Output Summary (Last 24 hours) at 12/25/11 1151 Last data filed at 12/25/11 1135  Gross per 24 hour  Intake    360 ml  Output   2875 ml  Net  -2515 ml     LAB RESULTS: CBC  Lab 12/24/11 0545 12/23/11 0624 12/22/11 0547 12/21/11 0500 12/19/11 0750 12/18/11 1832  WBC 10.4 11.9* 9.8 10.4 12.6* --  HGB 11.7* 11.8* 10.5* 10.2* 10.2* --  HCT 37.6* 36.6* 33.6* 32.0* 31.8* --  PLT 493* 439* 421* 408* 417* --  MCV 82.5 82.2 83.0 82.1 82.0 --  MCH 25.7* 26.5 25.9* 26.2 26.3 --  MCHC 31.1 32.2 31.3 31.9 32.1 --  RDW 14.1 14.1 14.3 14.2 14.5 --  LYMPHSABS -- -- -- -- -- 1.4  MONOABS -- -- -- -- --  1.2*  EOSABS -- -- -- -- -- 0.2  BASOSABS -- -- -- -- -- 0.1  BANDABS -- -- -- -- -- --    Chemistries   Lab 12/24/11 0545 12/23/11 0624 12/22/11 0547 12/21/11 0500 12/19/11 0750  NA 133* 133* 136 132* 132*  K 4.0 4.1 4.2 4.1 4.0  CL 95* 98 102 99 98  CO2 29 26 26 26 27   GLUCOSE 126* 145* 142* 117* 123*  BUN 5* 5* 7 6 4*  CREATININE 0.54 0.46* 0.58 0.66 0.61  CALCIUM 9.4 9.0 8.7 8.7 8.6  MG -- -- -- -- 2.1    CBG:  Lab 12/25/11 0621 12/24/11 0636  12/23/11 0605  GLUCAP 128* 128* 148*    GFR Estimated Creatinine Clearance: 105.6 ml/min (by C-G formula based on Cr of 0.54).  Coagulation profile  Lab 12/21/11 0500 12/18/11 2046  INR 1.58* 1.45  PROTIME -- --    Cardiac Enzymes  Lab 12/18/11 1841  CKMB --  TROPONINI <0.30  MYOGLOBIN --    No components found with this basename: POCBNP:3 No results found for this basename: DDIMER:2 in the last 72 hours No results found for this basename: HGBA1C:2 in the last 72 hours No results found for this basename: CHOL:2,HDL:2,LDLCALC:2,TRIG:2,CHOLHDL:2,LDLDIRECT:2 in the last 72 hours No results found for this basename: TSH,T4TOTAL,FREET3,T3FREE,THYROIDAB in the last 72 hours No results found for this basename: VITAMINB12:2,FOLATE:2,FERRITIN:2,TIBC:2,IRON:2,RETICCTPCT:2 in the last 72 hours No results found for this basename: LIPASE:2,AMYLASE:2 in the last 72 hours  Urine Studies No results found for this basename: UACOL:2,UAPR:2,USPG:2,UPH:2,UTP:2,UGL:2,UKET:2,UBIL:2,UHGB:2,UNIT:2,UROB:2,ULEU:2,UEPI:2,UWBC:2,URBC:2,UBAC:2,CAST:2,CRYS:2,UCOM:2,BILUA:2 in the last 72 hours  MICROBIOLOGY: Recent Results (from the past 240 hour(s))  CULTURE, BLOOD (ROUTINE X 2)     Status: Normal   Collection Time   12/18/11  8:54 PM      Component Value Range Status Comment   Specimen Description BLOOD ARM RIGHT   Final    Special Requests BOTTLES DRAWN AEROBIC AND ANAEROBIC 10CC   Final    Culture  Setup Time 12/19/2011 01:47   Final    Culture NO GROWTH 5 DAYS   Final    Report Status 12/25/2011 FINAL   Final   CULTURE, BLOOD (ROUTINE X 2)     Status: Normal   Collection Time   12/18/11  9:08 PM      Component Value Range Status Comment   Specimen Description BLOOD HAND LEFT   Final    Special Requests BOTTLES DRAWN AEROBIC ONLY [facility]   Final    Culture  Setup Time 12/19/2011 01:47   Final    Culture NO GROWTH 5 DAYS   Final    Report Status 12/25/2011 FINAL   Final   GRAM STAIN     Status:  Normal   Collection Time   12/19/11 11:45 AM      Component Value Range Status Comment   Specimen Description PLEURAL FLUID LEFT   Final    Special Requests   Final    Gram Stain     Final    Value: RARE WBC PRESENT,BOTH PMN AND MONONUCLEAR     NO ORGANISMS SEEN   Report Status 12/19/2011 FINAL   Final   BODY FLUID CULTURE     Status: Normal   Collection Time   12/19/11 11:45 AM      Component Value Range Status Comment   Specimen Description PLEURAL FLUID LEFT   Final    Special Requests   Final    Gram Stain  Final    Value: RARE WBC PRESENT,BOTH PMN AND MONONUCLEAR     NO ORGANISMS SEEN     Performed at [facility]   Culture NO GROWTH 3 DAYS   Final    Report Status 12/22/2011 FINAL   Final   AFB CULTURE WITH SMEAR     Status: Normal (Preliminary result)   Collection Time   12/19/11 11:45 AM      Component Value Range Status Comment   Specimen Description PLEURAL FLUID LEFT   Final    Special Requests   Final    ACID FAST SMEAR NO ACID FAST BACILLI SEEN   Final    Culture     Final    Value: CULTURE WILL BE EXAMINED FOR 6 WEEKS BEFORE ISSUING A FINAL REPORT   Report Status PENDING   Incomplete   FUNGUS CULTURE W SMEAR     Status: Normal (Preliminary result)   Collection Time   12/19/11 11:45 AM      Component Value Range Status Comment   Specimen Description PLEURAL FLUID LEFT   Final    Special Requests   Final    Fungal Smear NO YEAST OR FUNGAL ELEMENTS SEEN   Final    Culture CULTURE IN PROGRESS FOR FOUR WEEKS   Final    Report Status PENDING   Incomplete   CULTURE, EXPECTORATED SPUTUM-ASSESSMENT     Status: Normal   Collection Time   12/19/11  2:07 PM      Component Value Range Status Comment   Specimen Description SPUTUM   Final    Special Requests NONE   Final    Sputum evaluation     Final    Value: MICROSCOPIC FINDINGS SUGGEST THAT THIS SPECIMEN IS NOT REPRESENTATIVE OF LOWER RESPIRATORY SECRETIONS. PLEASE RECOLLECT.     CALLED TO RN  J.SMITH AT 1533 BY L.PITT 12/19/11.   Report Status 12/19/2011 FINAL   Final     RADIOLOGY STUDIES/RESULTS: Dg Chest 2 View  12/21/2011  *RADIOLOGY REPORT*  Clinical Data: Follow up left pleural effusion  CHEST - 2 VIEW  Comparison: 12/19/2011  Findings: Right chest wall porta-catheter is identified with tip in the cavoatrial junction.  There is a large left pleural effusion which is not significantly changed from previous exam.  Right lung is clear.  IMPRESSION:  1.  No change in volume of left pleural effusion.   Original Report Authenticated By: Rosealee Albee, M.D.    Dg Chest 2 View  12/18/2011  *RADIOLOGY REPORT*  Clinical Data: Short of breath and pneumonia  CHEST - 2 VIEW  Comparison: 11/29/2011  Findings: Large left effusion has progressed from the  prior study. There is collapse of the left lower lobe.  Right lung is clear. Port-A-Cath tip remains in the lower SVC.  No heart failure.  IMPRESSION: Large left effusion has progressed from the prior study.  There is collapse of the left lower lobe.  Thoracentesis is suggested to rule out underlying malignancy or infection.   Original Report Authenticated By: Camelia Phenes, M.D.    Dg Chest 2 View  11/29/2011  *RADIOLOGY REPORT*  Clinical Data: Cough, shortness of breath and chest pain.  CHEST - 2 VIEW  Comparison: CT chest 11/02/2011 and chest radiograph 11/01/2011.  Findings: Trachea is midline.  Heart size stable.  Right IJ power port tip projects over the SVC.  Small left pleural effusion and left basilar airspace disease are new.  IMPRESSION: Small left pleural  effusion with left basilar airspace disease.   Original Report Authenticated By: Reyes Ivan, M.D.    Ct Angio Chest Pe W/cm &/or Wo Cm  12/18/2011  *RADIOLOGY REPORT*  Clinical Data: Chest pain and shortness of breath.  History of colon cancer.  CT ANGIOGRAPHY CHEST  Technique:  Multidetector CT imaging of the chest using the standard protocol during bolus administration of  intravenous contrast. Multiplanar reconstructed images including MIPs were obtained and reviewed to evaluate the vascular anatomy.  Contrast: OMNIPAQUE IOHEXOL 350 MG/ML SOLN  Comparison: PA and lateral chest earlier this same day and CT chest 11/02/2011.  Findings: No pulmonary embolus is identified.  Since the prior CT scan, the small left pleural effusion seen on the prior study has increased in size and now nearly fills the left chest.  Trace amount of pleural fluid seen on the prior study has resolved. There is a small pericardial effusion, increased in size since the prior study.  Cardiomegaly is noted.  There are some small mediastinal lymph nodes but no pathologic lymphadenopathy by CT size criteria in the axilla, hila or mediastinum is identified. Coronary atherosclerotic vascular disease is noted.  The lungs demonstrate mark compressive atelectasis of the left lung.  The right lung is clear.  Incidentally imaged upper abdomen is unremarkable.  No focal bony abnormality is identified.  IMPRESSION:  1.  Negative for pulmonary embolus. 2.  Marked increase in a left pleural effusion which now nearly fills the left chest.  Associated compressive atelectasis of the left lung is seen. 3.  New very small pericardial effusion. 4.  Calcific coronary artery disease.  Cardiomegaly also noted.   Original Report Authenticated By: Bernadene Bell. Maricela Curet, M.D.    Ct Guided Abscess Drain  12/21/2011  *RADIOLOGY REPORT*  Clinical Data/Indication: LEFT PLEURAL EFFUSION  CT GUIDED ABCESS DRAINAGE WITH CATHETER  Sedation: Versed 2.0 mg, Fentanyl 100 mg.  Total Moderate Sedation Time: 21 minutes.  Procedure: The procedure, risks, benefits, and alternatives were explained to the patient. Questions regarding the procedure were encouraged and answered. The patient understands and consents to the procedure.  The left anterolateral chest was prepped with betadine in a sterile fashion, and a sterile drape was applied covering  the operative field. A sterile gown and sterile gloves were used for the procedure.  Under CT guidance, an 19 gauge needle was inserted into the left pleural space via left anterior axillary line approach.  It was removed over an 0038 wire. Successive 12 and 18-French dilators were inserted.  A 16-French Thal-Quik drain was then advanced over the wire coiled in the pleural fluid.  It was then attached to a Pleur-Evac at 20 cm water suction.  Findings: Imaging demonstrates placement of a left 16-French thoracostomy tube.  Complications: None.  IMPRESSION: Successful left  16-French thoracostomy tube placement.   Original Report Authenticated By: Donavan Burnet, M.D.    Ct Abdomen Pelvis W Contrast  12/05/2011  *RADIOLOGY REPORT*  Clinical Data: Rectal carcinoma.  Lower abdominal pain.  Pelvic fluid collection.  CT ABDOMEN AND PELVIS WITH CONTRAST  Technique:  Multidetector CT imaging of the abdomen and pelvis was performed following the standard protocol during bolus administration of intravenous contrast.  Contrast: 80mL OMNIPAQUE IOHEXOL 300 MG/ML  SOLN  Comparison: 10/09/2011  Findings: Images through the lung bases show a moderate left-sided pleural effusion and left lower lobe atelectasis.  Hepatic cirrhosis is again noted, however no liver masses are identified.  The other abdominal parenchymal organs are  normal in appearance.  Gallbladder is unremarkable.  No evidence of hydronephrosis.  A large partially necrotic soft tissue mass is again seen involving the rectum, with extension to involve the left pelvic sidewall and base of the urinary bladder.  This measures approximately 8.6 x 10.4 cm maximum dimensions, without significant change in size since prior study.  Asymmetric diffuse wall thickening of the urinary bladder is also stable in appearance, which may be due to tumor, muscular hypertrophy, or cystitis.  Shotty less than 1 cm lymph nodes are again seen in the central sigmoid mesentery and right lower  quadrant small bowel mesentery which are stable.  Shotty less than 1 cm retroperitoneal lymph nodes in the aortocaval space are also stable.  No new or worsening areas of lymphadenopathy identified.  No evidence of inflammatory process or abnormal fluid collections. No evidence of bowel obstruction.  A left lower quadrant colostomy is again demonstrated.  No suspicious bone lesions identified.  IMPRESSION:  1.  No significant change in size of large, partially necrotic rectal mass, with involvement of the left pelvic side wall and base of the urinary bladder. 2.  Stable shotty sigmoid and small bowel mesenteric lymphadenopathy, and retroperitoneal lymphadenopathy. 3.  Increased moderate left pleural effusion and left lower lobe atelectasis. 4.  Stable hepatic cirrhosis.  No evidence of hepatic neoplasm, ascites, or abscess.   Original Report Authenticated By: Danae Orleans, M.D.    Ct Aspiration  12/05/2011  *RADIOLOGY REPORT*  CT GUIDED ASPIRATION  Date: 12/05/2011  Clinical History: 56 year old male with a history of advanced rectal cancer status post APR and adjuvant CTX with a persistent fluid and gas collection in the surgical bed concerning for recurrent necrotic tumor versus abscess. The CEA level has been rising.  Procedures Performed: 1. CT guided aspiration  Interventional Radiologist:  Sterling Big, MD  Sedation: Moderate (conscious) sedation was used.  1.5 mg Versed, 50 mcg Fentanyl were administered intravenously.  The patient's vital signs were monitored continuously by radiology nursing throughout the procedure.  Sedation Time: 22 minutes  Fluoroscopy time: 10 seconds  PROCEDURE/FINDINGS:   Informed consent was obtained from the patient following explanation of the procedure, risks, benefits and alternatives. The patient understands, agrees and consents for the procedure. All questions were addressed. A time out was performed.  Maximal barrier sterile technique utilized including caps, mask,  sterile gowns, sterile gloves, large sterile drape, hand hygiene, and betadine skin prep.  A planning axial CT scan was performed.  An appropriate skin entry site was selected and marked.  Local anesthesia was obtained with infiltration of 1% lidocaine.  Under CT fluoroscopic guidance, an 18 gauge trocar needle was advanced to the skin and soft tissues into the fluid and gas collection in the deep anatomic pelvis.  A very small amount (8 ml) of bloody fluid containing chunks of caseous material was successfully aspirated.  This material was divided and sent to microbiology for culture, as well as pathology for pathologic analysis.  The needle was manipulated in multiple directions in an effort to aspirate additional material.  This was not successful.  Location of the needle was confirmed with axial CT imaging.  Given the imaging findings, and lack of purulent return, it is felt that this lesion represents a centrally necrotic recurrent tumor. Therefore, the needle was withdrawn.  No abscess drain was placed. The patient tolerated the procedure well, there was no immediate complication.  After appropriate observation period, the patient was discharged home.  IMPRESSION:  Technically successful CT guided aspiration of a small amount of bloody fluid and caseous material from within the deep pelvic necrotic mass.  The material was divided and sent to microbiology for culture, and pathology for pathologic evaluation.  No percutaneous drain was placed.  These results were called by telephone on 12/05/2011 at 03:45 p.m. to Dr. [name], who verbally acknowledged these results.  Signed,  Sterling Big, MD Vascular & Interventional Radiologist [facility] Radiology   Original Report Authenticated By: Alvino Blood Chest Port 1 View  12/23/2011  *RADIOLOGY REPORT*  Clinical Data: Evaluate left-sided chest tube  PORTABLE CHEST - 1 VIEW  Comparison: 12/22/2011; 12/21/2011; 12/19/2011; CT guided thoracostomy tube placement   Findings: Grossly unchanged large cardiac silhouette and mediastinal contours.  Stable position of support apparatus. Interval reaccumulation of a small left-sided pleural effusion and left mid and lower lung heterogeneous / consolidative opacities post left-sided thoracotomy tube placement. Increased right infrahilar heterogeneous opacities.  No pneumothorax.  Unchanged bones.  IMPRESSION: 1.  Stable positioning of support apparatus. No pneumothorax.  2.  Interval reaccumulation of small left-sided effusion with associated left mid and lower lung opacities, favored to represent atelectasis. 2.  Worsening right infrahilar opacities favored to represent atelectasis.   Original Report Authenticated By: Waynard Reeds, M.D.    Dg Chest Port 1 View  12/22/2011  *RADIOLOGY REPORT*  Clinical Data: Chest tube.  Shortness of breath.  PORTABLE CHEST - 1 VIEW  Comparison: Yesterday.  Findings: Interval small caliber left chest tube.  The previously seen left pleural fluid is no longer present.  Patchy airspace opacity in the left mid and lower lung zones.  Clear right lung. Right jugular porta-catheter, unchanged.  Enlarged cardiac silhouette.  Unremarkable bones.  IMPRESSION:  1.  Interval resolution of the left pleural fluid following chest tube placement. 2.  Patchy airspace opacity in the left mid and lower lung zones. This could represent pneumonia, re-expansion pulmonary edema or residual atelectasis. 3.  Cardiomegaly.   Original Report Authenticated By: Darrol Angel, M.D.    Dg Chest Port 1 View  12/19/2011  *RADIOLOGY REPORT*  Clinical Data: Left chest pain following thoracentesis.  PORTABLE CHEST - 1 VIEW  Comparison: Portable film earlier in the day.  Findings: Moderate left pleural effusion persists.  Compressive atelectasis left base.  Cardiomegaly.  No pneumothorax.  Right lung shows mild vascular congestion.  Unchanged Port-A-Cath. Unremarkable osseous structures.  IMPRESSION: Moderate left effusion  persists.  There is no pneumothorax or significant change from earlier in the day.   Original Report Authenticated By: Elsie Stain, M.D.    Dg Chest Port 1 View  12/19/2011  *RADIOLOGY REPORT*  Clinical Data: Left thoracentesis follow-up.  PORTABLE CHEST - 1 VIEW  Comparison: 12/18/2011 CT  Findings: Decrease in size of the left pleural effusion. Hazy appearance to the left aerated portion of the lung may correspond to superimposed layering fluid in the pleural space or airspace edema.  No pneumothorax visualized.  Cardiomegaly.  Right Port-A- Cath with tip projecting over the cavoatrial junction.  No interval osseous change.  IMPRESSION: Decreased size of the left pleural effusion status post thoracentesis.  Hazy appearance to the left lung may reflect edema or superimposed layering pleural fluid.  No pneumothorax.   Original Report Authenticated By: Waneta Martins, M.D.     MEDICATIONS: Scheduled Meds:   . aspirin EC  81 mg Oral Daily  . levofloxacin  750 mg Oral QHS  . pantoprazole  40 mg Oral Daily  . simvastatin  10 mg Oral QHS  . sodium chloride  3 mL Intravenous Q12H  . vitamin B-12  1,000 mcg Oral Daily  . vitamin C  500 mg Oral Daily   Continuous Infusions:  PRN Meds:.albuterol, benzonatate, guaiFENesin-dextromethorphan, HYDROcodone-acetaminophen, HYDROmorphone (DILAUDID) injection, morphine injection, prochlorperazine, sodium chloride  Antibiotics: Anti-infectives     Start     Dose/Rate Route Frequency Ordered Stop   12/21/11 2200   levofloxacin (LEVAQUIN) tablet 750 mg        750 mg Oral Daily at bedtime 12/21/11 1431 12/29/11 2159   12/19/11 0800   vancomycin (VANCOCIN) IVPB 1000 mg/200 mL premix  Status:  Discontinued        1,000 mg 200 mL/hr over 60 Minutes Intravenous Every 8 hours 12/19/11 0106 12/21/11 1342   12/19/11 0115   vancomycin (VANCOCIN) IVPB 1000 mg/200 mL premix        1,000 mg 200 mL/hr over 60 Minutes Intravenous NOW 12/19/11 0106 12/19/11 0218     12/19/11 0045   ceFEPIme (MAXIPIME) 1 g in dextrose 5 % 50 mL IVPB  Status:  Discontinued        1 g 100 mL/hr over 30 Minutes Intravenous 3 times per day 12/19/11 0035 12/22/11 1705   12/19/11 0045   levofloxacin (LEVAQUIN) IVPB 750 mg        750 mg 100 mL/hr over 90 Minutes Intravenous Every 24 hours 12/19/11 0035 12/20/11 2149   12/18/11 2100  piperacillin-tazobactam (ZOSYN) IVPB 4.5 g       4.5 g 200 mL/hr over 30 Minutes Intravenous  Once 12/18/11 2042 12/18/11 2319   12/18/11 2045   levofloxacin (LEVAQUIN) IVPB 750 mg  Status:  Discontinued        750 mg 100 mL/hr over 90 Minutes Intravenous Every 24 hours 12/18/11 2042 12/19/11 0106           Jeoffrey Massed, MD  [facility] Pager:[phone]  If 7PM-7AM, please contact night-coverage [email or web address] Password [facility] 12/25/2011, 11:51 AM   LOS: 7 days

## 2011-12-26 ENCOUNTER — Inpatient Hospital Stay (HOSPITAL_COMMUNITY): Payer: BC Managed Care – PPO

## 2011-12-26 LAB — BASIC METABOLIC PANEL
BUN: 7 mg/dL (ref 6–23)
CO2: 29 mEq/L (ref 19–32)
Calcium: 9.2 mg/dL (ref 8.4–10.5)
Chloride: 98 mEq/L (ref 96–112)
Creatinine, Ser: 0.59 mg/dL (ref 0.50–1.35)
Glucose, Bld: 128 mg/dL — ABNORMAL HIGH (ref 70–99)

## 2011-12-26 LAB — CBC
HCT: 36.7 % — ABNORMAL LOW (ref 39.0–52.0)
Hemoglobin: 11.6 g/dL — ABNORMAL LOW (ref 13.0–17.0)
MCH: 25.8 pg — ABNORMAL LOW (ref 26.0–34.0)
MCV: 81.6 fL (ref 78.0–100.0)
Platelets: 439 10*3/uL — ABNORMAL HIGH (ref 150–400)
RBC: 4.5 MIL/uL (ref 4.22–5.81)
WBC: 10.4 10*3/uL (ref 4.0–10.5)

## 2011-12-26 NOTE — Progress Notes (Signed)
[facility] PROGRESS NOTE  Adam Henderson WUJ:[id number] DOB: 15-May-1955 DOA: 12/18/2011 PCP: [name], MD  Assessment/Plan: Principal Problem:  *Pleural effusion Active Problems:  ADENOCARCINOMA  SIRS (systemic inflammatory response syndrome)  Hypotension    Principal Problem:  *Pleural effusion most likely malignant  -exudative-given prior h/o PNA-suspicous of para pneumonic effusion, however no obvious PNA seen on prior radiologic studies. Chest tube already in-once removed-and if pleural effusion recurs-will need a VATS procedure to make sure this is not malignant-given h/o colorectal malignancy. Although cytology negative for malignant cells. CT Chest on 9/16-no PE, no obvious parenchymal or pleural lesions.Pleural Fluid cultures neg so far, including AFB smear.  -PCCM and IR following  -Continue levofloxacin for ~2 weeks total  ultimatley if drainage does not stop, or effusion recurs will need VATS eval Tube is still draining, will defer management of removal to pulmonary service  Nonsustained ventricular tachycardia  Adam is not a candidate for ICD implantation. No further workup per cardiology.   Rectal cancer stage IIIB with metastases to abdomen/peritoneum  -Completed chemotherapy in October of 2012, Adam currently being followed by Oncologist in [place], Adam Henderson. Adam Henderson, Adam Henderson. Adam Henderson.   -Suspicion for malignant pleural effusion-if pleural effusion recurs after removal of the chest tube-will need to consult [facility] for a VATS procedure  6 mm Lt upper lobe pulmonary nodule  Incidental finding on CT chest 11/02/11. Will need outpatient followup.   Dyslipidemia  -c/w statin  SIRS (systemic inflammatory response syndrome)  -resolved  Hypotension  -resolved, BP stilL soft  Coronary artery disease  - aspirin.  Chronic Systolic Heart Failure  -compensated  clinically  -Effusion-exudate, not releated to CHF  Disposition:  Remain inpatient  DVT Prophylaxis:  Prophylactic Lovenox  Code Status:  Full code     Procedures:  Cultures / Cytology:  BC x 2 9/16>>>  Fungal / AFB 9/17>>>  Pleural Culture 9/17 > neg  Pleural Cytology>>>reactive mesothelial cells, negative for malignancy  Antibiotics:  Cefepime 9/16 (?CAP)> 9/20 levaquin 9/16>>>  Tests / Events:  CTAbd lung cut - no effusion  CT chest 11/02/11 -small left pleural effusion  CT abdomen 12/05/11 - increassing left pleural effusion  CT chest 9/16>>Large left pleural effusion, compressive ATX, small pericardial effusion, coronary calcification, cardiomegaly  Lt thoracentesis 9/17>>1500 ml fluid, protein 5.4, LDH 161, glucose 137, 537 WBC (60N, 13L, 47M, E3)  L pigtail 9/19 >>  CONSULTS:  cardiology and pulmonary/intensive care    subjective Complaining of pain on the side of the tube   Objective: Filed Vitals:   12/25/11 2145 12/26/11 0153 12/26/11 0628 12/26/11 0908  BP: 106/73 97/66 101/64 96/69  Pulse: 88 92 89 88  Temp: 97.9 F (36.6 C) 98.2 F (36.8 C) 97.9 F (36.6 C)   TempSrc: Oral Oral Oral   Resp: 18 18 18 16   Height:      Weight:   83.6 kg (184 lb 4.9 oz)   SpO2: 99% 99% 99%     Intake/Output Summary (Last 24 hours) at 12/26/11 0938 Last data filed at 12/26/11 0927  Gross per 24 hour  Intake   1480 ml  Output   1776 ml  Net   -296 ml    Exam:  HENT:  Head: Atraumatic.  Nose: Nose normal.  Mouth/Throat: Oropharynx is clear and moist.  Eyes: Conjunctivae are normal. Pupils are equal, round, and reactive to light. No scleral  icterus.  Neck: Neck supple. No tracheal deviation present.  Cardiovascular: Normal rate, regular rhythm, normal heart sounds and intact distal pulses.  Pulmonary/Chest: Effort normal and breath sounds normal. No respiratory distress.  Abdominal: Soft. Normal appearance and bowel sounds are normal. She exhibits no distension.  There is no tenderness.  Musculoskeletal: She exhibits no edema and no tenderness.  Neurological: She is alert. No cranial nerve deficit.    Data Reviewed: Basic Metabolic Panel:  Lab 12/26/11 1610 12/24/11 0545 12/23/11 0624 12/22/11 0547 12/21/11 0500  NA 138 133* 133* 136 132*  K 4.0 4.0 4.1 4.2 4.1  CL 98 95* 98 102 99  CO2 29 29 26 26 26   GLUCOSE 128* 126* 145* 142* 117*  BUN 7 5* 5* 7 6  CREATININE 0.59 0.54 0.46* 0.58 0.66  CALCIUM 9.2 9.4 9.0 8.7 8.7  MG -- -- -- -- --  PHOS -- -- -- -- --    Liver Function Tests: No results found for this basename: AST:5,ALT:5,ALKPHOS:5,BILITOT:5,PROT:5,ALBUMIN:5 in the last 168 hours No results found for this basename: LIPASE:5,AMYLASE:5 in the last 168 hours No results found for this basename: AMMONIA:5 in the last 168 hours  CBC:  Lab 12/26/11 0519 12/24/11 0545 12/23/11 0624 12/22/11 0547 12/21/11 0500  WBC 10.4 10.4 11.9* 9.8 10.4  NEUTROABS -- -- -- -- --  HGB 11.6* 11.7* 11.8* 10.5* 10.2*  HCT 36.7* 37.6* 36.6* 33.6* 32.0*  MCV 81.6 82.5 82.2 83.0 82.1  PLT 439* 493* 439* 421* 408*    Cardiac Enzymes: No results found for this basename: CKTOTAL:5,CKMB:5,CKMBINDEX:5,TROPONINI:5 in the last 168 hours BNP (last 3 results)  Basename 11/16/11 1150 11/01/11 1532  PROBNP 229.0* 1658.0*     CBG:  Lab 12/25/11 0621 12/24/11 0636 12/23/11 0605  GLUCAP 128* 128* 148*    Recent Results (from the past 240 hour(s))  CULTURE, BLOOD (ROUTINE X 2)     Status: Normal   Collection Time   12/18/11  8:54 PM      Component Value Range Status Comment   Specimen Description BLOOD ARM RIGHT   Final    Special Requests BOTTLES DRAWN AEROBIC AND ANAEROBIC 10CC   Final    Culture  Setup Time 12/19/2011 01:47   Final    Culture NO GROWTH 5 DAYS   Final    Report Status 12/25/2011 FINAL   Final   CULTURE, BLOOD (ROUTINE X 2)     Status: Normal   Collection Time   12/18/11  9:08 PM      Component Value Range Status Comment   Specimen  Description BLOOD HAND LEFT   Final    Special Requests BOTTLES DRAWN AEROBIC ONLY [facility]   Final    Culture  Setup Time 12/19/2011 01:47   Final    Culture NO GROWTH 5 DAYS   Final    Report Status 12/25/2011 FINAL   Final   GRAM STAIN     Status: Normal   Collection Time   12/19/11 11:45 Henderson      Component Value Range Status Comment   Specimen Description PLEURAL FLUID LEFT   Final    Special Requests   Final    Gram Stain     Final    Value: RARE WBC PRESENT,BOTH PMN AND MONONUCLEAR     NO ORGANISMS SEEN   Report Status 12/19/2011 FINAL   Final   BODY FLUID CULTURE     Status: Normal   Collection Time   12/19/11 11:45 Henderson  Component Value Range Status Comment   Specimen Description PLEURAL FLUID LEFT   Final    Special Requests   Final    Gram Stain     Final    Value: RARE WBC PRESENT,BOTH PMN AND MONONUCLEAR     NO ORGANISMS SEEN     Performed at [facility]   Culture NO GROWTH 3 DAYS   Final    Report Status 12/22/2011 FINAL   Final   AFB CULTURE WITH SMEAR     Status: Normal (Preliminary result)   Collection Time   12/19/11 11:45 Henderson      Component Value Range Status Comment   Specimen Description PLEURAL FLUID LEFT   Final    Special Requests   Final    ACID FAST SMEAR NO ACID FAST BACILLI SEEN   Final    Culture     Final    Value: CULTURE WILL BE EXAMINED FOR 6 WEEKS BEFORE ISSUING A FINAL REPORT   Report Status PENDING   Incomplete   FUNGUS CULTURE W SMEAR     Status: Normal (Preliminary result)   Collection Time   12/19/11 11:45 Henderson      Component Value Range Status Comment   Specimen Description PLEURAL FLUID LEFT   Final    Special Requests   Final    Fungal Smear NO YEAST OR FUNGAL ELEMENTS SEEN   Final    Culture CULTURE IN PROGRESS FOR FOUR WEEKS   Final    Report Status PENDING   Incomplete   CULTURE, EXPECTORATED SPUTUM-ASSESSMENT     Status: Normal   Collection Time   12/19/11  2:07 PM      Component Value Range Status Comment    Specimen Description SPUTUM   Final    Special Requests NONE   Final    Sputum evaluation     Final    Value: MICROSCOPIC FINDINGS SUGGEST THAT THIS SPECIMEN IS NOT REPRESENTATIVE OF LOWER RESPIRATORY SECRETIONS. PLEASE RECOLLECT.     CALLED TO RN J.SMITH AT 1533 BY L.PITT 12/19/11.   Report Status 12/19/2011 FINAL   Final      Studies: Dg Chest 2 View  12/21/2011  *RADIOLOGY REPORT*  Clinical Data: Follow up left pleural effusion  CHEST - 2 VIEW  Comparison: 12/19/2011  Findings: Right chest wall porta-catheter is identified with tip in the cavoatrial junction.  There is a large left pleural effusion which is not significantly changed from previous exam.  Right lung is clear.  IMPRESSION:  1.  No change in volume of left pleural effusion.   Original Report Authenticated By: Rosealee Albee, M.D.    Dg Chest 2 View  12/18/2011  *RADIOLOGY REPORT*  Clinical Data: Short of breath and pneumonia  CHEST - 2 VIEW  Comparison: 11/29/2011  Findings: Large left effusion has progressed from the  prior study. There is collapse of the left lower lobe.  Right lung is clear. Port-A-Cath tip remains in the lower SVC.  No heart failure.  IMPRESSION: Large left effusion has progressed from the prior study.  There is collapse of the left lower lobe.  Thoracentesis is suggested to rule out underlying malignancy or infection.   Original Report Authenticated By: Camelia Phenes, M.D.    Dg Chest 2 View  11/29/2011  *RADIOLOGY REPORT*  Clinical Data: Cough, shortness of breath and chest pain.  CHEST - 2 VIEW  Comparison: CT chest 11/02/2011 and chest radiograph 11/01/2011.  Findings: Trachea  is midline.  Heart size stable.  Right IJ power port tip projects over the SVC.  Small left pleural effusion and left basilar airspace disease are new.  IMPRESSION: Small left pleural effusion with left basilar airspace disease.   Original Report Authenticated By: Reyes Ivan, M.D.    Ct Angio Chest Pe W/cm &/or Wo  Cm  12/18/2011  *RADIOLOGY REPORT*  Clinical Data: Chest pain and shortness of breath.  History of colon cancer.  CT ANGIOGRAPHY CHEST  Technique:  Multidetector CT imaging of the chest using the standard protocol during bolus administration of intravenous contrast. Multiplanar reconstructed images including MIPs were obtained and reviewed to evaluate the vascular anatomy.  Contrast: OMNIPAQUE IOHEXOL 350 MG/ML SOLN  Comparison: PA and lateral chest earlier this same day and CT chest 11/02/2011.  Findings: No pulmonary embolus is identified.  Since the prior CT scan, the small left pleural effusion seen on the prior study has increased in size and now nearly fills the left chest.  Trace amount of pleural fluid seen on the prior study has resolved. There is a small pericardial effusion, increased in size since the prior study.  Cardiomegaly is noted.  There are some small mediastinal lymph nodes but no pathologic lymphadenopathy by CT size criteria in the axilla, hila or mediastinum is identified. Coronary atherosclerotic vascular disease is noted.  The lungs demonstrate mark compressive atelectasis of the left lung.  The right lung is clear.  Incidentally imaged upper abdomen is unremarkable.  No focal bony abnormality is identified.  IMPRESSION:  1.  Negative for pulmonary embolus. 2.  Marked increase in a left pleural effusion which now nearly fills the left chest.  Associated compressive atelectasis of the left lung is seen. 3.  New very small pericardial effusion. 4.  Calcific coronary artery disease.  Cardiomegaly also noted.   Original Report Authenticated By: Bernadene Bell. Maricela Curet, M.D.    Ct Guided Abscess Drain  12/21/2011  *RADIOLOGY REPORT*  Clinical Data/Indication: LEFT PLEURAL EFFUSION  CT GUIDED ABCESS DRAINAGE WITH CATHETER  Sedation: Versed 2.0 mg, Fentanyl 100 mg.  Total Moderate Sedation Time: 21 minutes.  Procedure: The procedure, risks, benefits, and alternatives were explained to the  Adam. Questions regarding the procedure were encouraged and answered. The Adam understands and consents to the procedure.  The left anterolateral chest was prepped with betadine in a sterile fashion, and a sterile drape was applied covering the operative field. A sterile gown and sterile gloves were used for the procedure.  Under CT guidance, an 19 gauge needle was inserted into the left pleural space via left anterior axillary line approach.  It was removed over an 0038 wire. Successive 12 and 18-French dilators were inserted.  A 16-French Thal-Quik drain was then advanced over the wire coiled in the pleural fluid.  It was then attached to a Pleur-Evac at 20 cm water suction.  Findings: Imaging demonstrates placement of a left 16-French thoracostomy tube.  Complications: None.  IMPRESSION: Successful left  16-French thoracostomy tube placement.   Original Report Authenticated By: Donavan Burnet, M.D.    Ct Abdomen Pelvis W Contrast  12/05/2011  *RADIOLOGY REPORT*  Clinical Data: Rectal carcinoma.  Lower abdominal pain.  Pelvic fluid collection.  CT ABDOMEN AND PELVIS WITH CONTRAST  Technique:  Multidetector CT imaging of the abdomen and pelvis was performed following the standard protocol during bolus administration of intravenous contrast.  Contrast: 80mL OMNIPAQUE IOHEXOL 300 MG/ML  SOLN  Comparison: 10/09/2011  Findings: Images through  the lung bases show a moderate left-sided pleural effusion and left lower lobe atelectasis.  Hepatic cirrhosis is again noted, however no liver masses are identified.  The other abdominal parenchymal organs are normal in appearance.  Gallbladder is unremarkable.  No evidence of hydronephrosis.  A large partially necrotic soft tissue mass is again seen involving the rectum, with extension to involve the left pelvic sidewall and base of the urinary bladder.  This measures approximately 8.6 x 10.4 cm maximum dimensions, without significant change in size since prior study.   Asymmetric diffuse wall thickening of the urinary bladder is also stable in appearance, which may be due to tumor, muscular hypertrophy, or cystitis.  Shotty less than 1 cm lymph nodes are again seen in the central sigmoid mesentery and right lower quadrant small bowel mesentery which are stable.  Shotty less than 1 cm retroperitoneal lymph nodes in the aortocaval space are also stable.  No new or worsening areas of lymphadenopathy identified.  No evidence of inflammatory process or abnormal fluid collections. No evidence of bowel obstruction.  A left lower quadrant colostomy is again demonstrated.  No suspicious bone lesions identified.  IMPRESSION:  1.  No significant change in size of large, partially necrotic rectal mass, with involvement of the left pelvic side wall and base of the urinary bladder. 2.  Stable shotty sigmoid and small bowel mesenteric lymphadenopathy, and retroperitoneal lymphadenopathy. 3.  Increased moderate left pleural effusion and left lower lobe atelectasis. 4.  Stable hepatic cirrhosis.  No evidence of hepatic neoplasm, ascites, or abscess.   Original Report Authenticated By: Danae Orleans, M.D.    Ct Aspiration  12/05/2011  *RADIOLOGY REPORT*  CT GUIDED ASPIRATION  Date: 12/05/2011  Clinical History: 56 year old male with a history of advanced rectal cancer status post APR and adjuvant CTX with a persistent fluid and gas collection in the surgical bed concerning for recurrent necrotic tumor versus abscess. The CEA level has been rising.  Procedures Performed: 1. CT guided aspiration  Interventional Radiologist:  Sterling Big, MD  Sedation: Moderate (conscious) sedation was used.  1.5 mg Versed, 50 mcg Fentanyl were administered intravenously.  The Adam's vital signs were monitored continuously by radiology nursing throughout the procedure.  Sedation Time: 22 minutes  Fluoroscopy time: 10 seconds  PROCEDURE/FINDINGS:   Informed consent was obtained from the Adam following  explanation of the procedure, risks, benefits and alternatives. The Adam understands, agrees and consents for the procedure. All questions were addressed. A time out was performed.  Maximal barrier sterile technique utilized including caps, mask, sterile gowns, sterile gloves, large sterile drape, hand hygiene, and betadine skin prep.  A planning axial CT scan was performed.  An appropriate skin entry site was selected and marked.  Local anesthesia was obtained with infiltration of 1% lidocaine.  Under CT fluoroscopic guidance, an 18 gauge trocar needle was advanced to the skin and soft tissues into the fluid and gas collection in the deep anatomic pelvis.  A very small amount (8 ml) of bloody fluid containing chunks of caseous material was successfully aspirated.  This material was divided and sent to microbiology for culture, as well as pathology for pathologic analysis.  The needle was manipulated in multiple directions in an effort to aspirate additional material.  This was not successful.  Location of the needle was confirmed with axial CT imaging.  Given the imaging findings, and lack of purulent return, it is felt that this lesion represents a centrally necrotic recurrent tumor. Therefore, the  needle was withdrawn.  No abscess drain was placed. The Adam tolerated the procedure well, there was no immediate complication.  After appropriate observation period, the Adam was discharged home.  IMPRESSION:  Technically successful CT guided aspiration of a small amount of bloody fluid and caseous material from within the deep pelvic necrotic mass.  The material was divided and sent to microbiology for culture, and pathology for pathologic evaluation.  No percutaneous drain was placed.  These results were called by telephone on 12/05/2011 at 03:45 p.m. to Dr. [name], who verbally acknowledged these results.  Signed,  Sterling Big, MD Vascular & Interventional Radiologist [facility]   Original  Report Authenticated By: Alvino Blood Chest Port 1 View  12/23/2011  *RADIOLOGY REPORT*  Clinical Data: Evaluate left-sided chest tube  PORTABLE CHEST - 1 VIEW  Comparison: 12/22/2011; 12/21/2011; 12/19/2011; CT guided thoracostomy tube placement  Findings: Grossly unchanged large cardiac silhouette and mediastinal contours.  Stable position of support apparatus. Interval reaccumulation of a small left-sided pleural effusion and left mid and lower lung heterogeneous / consolidative opacities post left-sided thoracotomy tube placement. Increased right infrahilar heterogeneous opacities.  No pneumothorax.  Unchanged bones.  IMPRESSION: 1.  Stable positioning of support apparatus. No pneumothorax.  2.  Interval reaccumulation of small left-sided effusion with associated left mid and lower lung opacities, favored to represent atelectasis. 2.  Worsening right infrahilar opacities favored to represent atelectasis.   Original Report Authenticated By: Waynard Reeds, M.D.    Dg Chest Port 1 View  12/22/2011  *RADIOLOGY REPORT*  Clinical Data: Chest tube.  Shortness of breath.  PORTABLE CHEST - 1 VIEW  Comparison: Yesterday.  Findings: Interval small caliber left chest tube.  The previously seen left pleural fluid is no longer present.  Patchy airspace opacity in the left mid and lower lung zones.  Clear right lung. Right jugular porta-catheter, unchanged.  Enlarged cardiac silhouette.  Unremarkable bones.  IMPRESSION:  1.  Interval resolution of the left pleural fluid following chest tube placement. 2.  Patchy airspace opacity in the left mid and lower lung zones. This could represent pneumonia, re-expansion pulmonary edema or residual atelectasis. 3.  Cardiomegaly.   Original Report Authenticated By: Darrol Angel, M.D.    Dg Chest Port 1 View  12/19/2011  *RADIOLOGY REPORT*  Clinical Data: Left chest pain following thoracentesis.  PORTABLE CHEST - 1 VIEW  Comparison: Portable film earlier in the day.  Findings:  Moderate left pleural effusion persists.  Compressive atelectasis left base.  Cardiomegaly.  No pneumothorax.  Right lung shows mild vascular congestion.  Unchanged Port-A-Cath. Unremarkable osseous structures.  IMPRESSION: Moderate left effusion persists.  There is no pneumothorax or significant change from earlier in the day.   Original Report Authenticated By: Elsie Stain, M.D.    Dg Chest Port 1 View  12/19/2011  *RADIOLOGY REPORT*  Clinical Data: Left thoracentesis follow-up.  PORTABLE CHEST - 1 VIEW  Comparison: 12/18/2011 CT  Findings: Decrease in size of the left pleural effusion. Hazy appearance to the left aerated portion of the lung may correspond to superimposed layering fluid in the pleural space or airspace edema.  No pneumothorax visualized.  Cardiomegaly.  Right Port-A- Cath with tip projecting over the cavoatrial junction.  No interval osseous change.  IMPRESSION: Decreased size of the left pleural effusion status post thoracentesis.  Hazy appearance to the left lung may reflect edema or superimposed layering pleural fluid.  No pneumothorax.   Original Report Authenticated  By: Waneta Martins, M.D.     Scheduled Meds:   . aspirin EC  81 mg Oral Daily  . enoxaparin (LOVENOX) injection  40 mg Subcutaneous Q24H  . levofloxacin  750 mg Oral QHS  . pantoprazole  40 mg Oral Daily  . simvastatin  10 mg Oral QHS  . sodium chloride  3 mL Intravenous Q12H  . vitamin B-12  1,000 mcg Oral Daily  . vitamin C  500 mg Oral Daily   Continuous Infusions:   Principal Problem:  *Pleural effusion Active Problems:  ADENOCARCINOMA  SIRS (systemic inflammatory response syndrome)  Hypotension    Time spent: 40 minutes   [facility]  [facility] Pager [phone]. If 8PM-8AM, please contact night-coverage at [email or web address], password [facility] 12/26/2011, 9:38 Henderson  LOS: 8 days

## 2011-12-26 NOTE — Progress Notes (Signed)
Unable to measure chest tube drainage .drainage went to other side chamber at xray dept

## 2011-12-26 NOTE — Progress Notes (Signed)
Name: Adam Henderson MRN: [id number] DOB: Jan 18, 1956   Date of admit: 12/18/2011   LOS: 8 Consulting MD:  Dr. Betti Cruz Reason for Consult:  Pleural Effusion Eval   Brief patient profile:   56 yowm admitted 12/18/2011 dyspnea, night sweats, dry cough and chest pain.  Had several weeks of outpt tx for PNA.  Found to have large Lt pleural effusion and PCCM consulted 9/17. Has hx of Stage IIIb rectal cancer s/p resection and s/p completion of chemo October 2012.  Found to have pelvic recurrence 12/05/11. PMHx CAD, Hyperlipidemia, Systolic CHF        Cultures / Cytology: BC x 2 9/16>>> Fungal / AFB 9/17>>> Pleural Culture 9/17 > neg Pleural Cytology>>>reactive mesothelial cells, negative for malignancy  Antibiotics: Cefepime 9/16 (?CAP)> 9/20 levaquin 9/16>>>  Tests / Events: CT Abd lung cut July 2013 - no effusion CT chest 11/02/11 -small left pleural effusion CT abdomen 12/05/11 - increassing left pleural effusion CT chest 9/16>>Large left pleural effusion, compressive ATX, small pericardial effusion, coronary calcification, cardiomegaly Lt thoracentesis 9/17>>1500 ml fluid, protein 5.4, LDH 161, glucose 137, 537 WBC (60N, 13L, 93M, E3) L pigtail 9/19 >>    SUBJECTIVE/OVERNIGHT/INTERVAL HX No sob, pain better  Feels stronger    Vital Signs: BP 96/69  Pulse 88  Temp 97.9 F (36.6 C) (Oral)  Resp 16  Ht 5\' 9"  (1.753 m)  Wt 83.6 kg (184 lb 4.9 oz)  BMI 27.22 kg/m2  SpO2 99% Room air   Intake/Output Summary (Last 24 hours) at 12/26/11 1109 Last data filed at 12/26/11 1035  Gross per 24 hour  Intake   1560 ml  Output   1976 ml  Net   -416 ml      Intake/Output      09/23 0701 - 09/24 0700 09/24 0701 - 09/25 0700   P.O. 900 1020   Total Intake(mL/kg) 900 (10.8) 1020 (12.2)   Urine (mL/kg/hr) 1900 (0.9) 200 (0.3)   Drains 175    Stool 1    Total Output 2076 200   Net -1176 +820           Physical Examination: General:  Chronically ill appearing white male.    Neuro:  Alert and oriented w/out focal def HEENT:  [place], no VJD Cardiovascular:  rrr Lungs:  Clear bilaterally Abdomen:  Non-tender Musculoskeletal:  intact Skin: intact    Labs and Imaging:   Lab 12/26/11 0519 12/24/11 0545 12/23/11 0624  NA 138 133* 133*  K 4.0 4.0 4.1  CL 98 95* 98  CO2 29 29 26   BUN 7 5* 5*  CREATININE 0.59 0.54 0.46*  GLUCOSE 128* 126* 145*    Lab 12/26/11 0519 12/24/11 0545 12/23/11 0624  HGB 11.6* 11.7* 11.8*  HCT 36.7* 37.6* 36.6*  WBC 10.4 10.4 11.9*  PLT 439* 493* 439*    Lab 12/26/11 0519 12/24/11 0545 12/23/11 0624 12/22/11 0547 12/21/11 0500  PROCALCITON -- -- -- -- <0.10  WBC 10.4 10.4 11.9* 9.8 --  LATICACIDVEN -- -- -- -- --    No results found.   No results found. PCXR: improved aeration. Decreased effusion. Left CT in place.   Assessment and Plan:  Large Left pleural effusion. Probable L parapneumonic effusion likely d/t PNA. S/p Pigtail cath placement on 9/19  - on 12/25/11: Left pigtail still draning, but starting to slow down. Given cancer recurrence and serial radiology from July not showing clear cut pneumonia and effusion being large and chest tube  still draining. We are moving malignant effusion to higher on list (cytology from pleural fluid is an insensitive test) Plan: - complete total 2 weeks monotherapy w/ levaquin  - recheck CXR in am, might be able to pull CT if drainage continues current trend of slowing down ultimatley if drainage does not stop, or effusion recurs will need VATS eval   6 mm Lt upper lobe pulmonary nodule.  Incidental finding on CT chest 11/02/11. Plan: -Will need outpt follow up  Metastatic colon cancer. Plan: -Pt requesting referral to new oncologist>>defer decision to primary team.    CAD.  Chronic systolic CHF Hyperlipidemia.   Plan: -per primary team -[facility] following     12/26/2011 11:09 AM   STAFF NOTE: I, Dr Lavinia Sharps have personally reviewed patient's  available data, including medical history, events of note, physical examination and test results as part of my evaluation. I have discussed with resident/NP and other care providers such as pharmacist, RN and RRT.  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 Effusion volume decreasing. Hopefully will dc pigtail soon. If effusion recurs, will need VATS. He is agreeable with plan. Have ordered PT, he appears deconditioned.  Rest per NP/medical resident whose note is outlined above and that I agree with    Dr. Kalman Shan, M.D., [facility].C.P Pulmonary and Critical Care Medicine Staff Physician [facility] Pager: [phone], If no answer or between  15:00h - 7:00h: call [phone]  12/26/2011 3:55 PM

## 2011-12-27 ENCOUNTER — Inpatient Hospital Stay (HOSPITAL_COMMUNITY): Payer: BC Managed Care – PPO

## 2011-12-27 MED ORDER — LEVOFLOXACIN 750 MG PO TABS
750.0000 mg | ORAL_TABLET | Freq: Every day | ORAL | Status: DC
  Administered 2011-12-27: 750 mg via ORAL
  Filled 2011-12-27 (×2): qty 1

## 2011-12-27 NOTE — Progress Notes (Signed)
It is very difficult to tell the output amount from the Chest tube due to fluid moving to other chamber, but it appears to be 0ml output for the nightshift. Tobin Witucki, [place] Quitter

## 2011-12-27 NOTE — Progress Notes (Signed)
Occupational Therapy Treatment Patient Details Name: Adam Henderson MRN: [id number] DOB: Aug 10, 1955 Today's Date: 12/27/2011 Time: 1100-1115 OT Time Calculation (min): 15 min  OT Assessment / Plan / Recommendation Comments on Treatment Session Completed OT education regarding E conservation, home safety and theraband HEP. No further OT needs. Encourage pt to ambulate with staff using RW.    Follow Up Recommendations  No OT follow up    Barriers to Discharge  None    Equipment Recommendations  None recommended by OT    Recommendations for Other Services  none  Frequency Min 2X/week   Plan Discharge plan remains appropriate    Precautions / Restrictions Precautions Precaution Comments: chest tube Restrictions Weight Bearing Restrictions: No   Pertinent Vitals/Pain No c/o    ADL  Eating/Feeding: Simulated;Independent Where Assessed - Eating/Feeding: Edge of bed Grooming: Simulated;Modified independent Where Assessed - Grooming: Supine, head of bed up Upper Body Bathing: Simulated;Modified independent Where Assessed - Upper Body Bathing: Unsupported sitting Lower Body Bathing: Simulated;Moderate assistance Where Assessed - Lower Body Bathing: Supported sit to stand Upper Body Dressing: Simulated;Set up Where Assessed - Upper Body Dressing: Unsupported sitting Lower Body Dressing: Simulated;Moderate assistance Where Assessed - Lower Body Dressing: Supported sit to Pharmacist, hospital: Simulated;Modified independent Toilet Transfer Method: Sit to stand;Stand pivot Acupuncturist: Other (comment) (bed = chair) Toileting - Clothing Manipulation and Hygiene: Performed;Modified independent Where Assessed - Toileting Clothing Manipulation and Hygiene: Sit to stand from 3-in-1 or toilet;Standing (Independent with colostomy care) Tub/Shower Transfer: Simulated;Modified independent Tub/Shower Transfer Method: Stand pivot;Ambulating;Squat pivot Equipment Used: Gait  belt;Rolling walker Transfers/Ambulation Related to ADLs: Mod I @ RW level.  ADL Comments: Discussed E conservation techniques and use of reacher to assist as needed. Discussed need to use built in shower seat for bathing until strength imporves.     OT Diagnosis: Generalized weakness;Acute pain  OT Problem List: Decreased strength;Decreased activity tolerance;Decreased knowledge of use of DME or AE;Pain OT Treatment Interventions:     OT Goals Acute Rehab OT Goals OT Goal Formulation: With patient Time For Goal Achievement: 01/03/12 Potential to Achieve Goals: Good ADL Goals Additional ADL Goal #1: Verbalize understaninding of E conservation techniques to increase independence with ADL and IADL. ADL Goal: Additional Goal #1 - Progress: Met Additional ADL Goal #2: Demonstrate functional mobility @ RW level within room @ MOdI level. ADL Goal: Additional Goal #2 - Progress: Met Arm Goals Pt Will Complete Theraband Exer: Independently;to increase strength;Bilateral upper extremities;2 sets;Level 2 Theraband Arm Goal: Theraband Exercises - Progress: Met  Visit Information  Last OT Received On: 12/27/11 PT/OT Co-Evaluation/Treatment: Yes    Subjective Data   I will be fine once I get this tube out.   Prior Functioning  Home Living Lives With: Family;Other (Comment) (lives with mother) Available Help at Discharge: Available PRN/intermittently;Other (Comment) (mother has breast CA) Type of Home: House Home Access: Stairs to enter [employer] of Steps: 1 Entrance Stairs-Rails: None Home Layout: One level Bathroom Shower/Tub: Walk-in shower;Door [employer] Toilet: Standard Bathroom Accessibility: Yes Home Adaptive Equipment: Grab bars in shower;Straight cane;Built-in shower seat;Walker - rolling Prior Function Level of Independence: Independent Able to Take Stairs?: No Driving: Yes 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 Communication: No difficulties Dominant Hand: Right    Cognition  Overall Cognitive Status: Appears within functional limits for tasks assessed/performed Arousal/Alertness: Awake/alert Orientation Level: Appears intact for tasks assessed Behavior During Session: [facility] for tasks performed    Mobility  Shoulder Instructions Bed  Mobility Bed Mobility: Supine to Sit Supine to Sit: 6: Modified independent (Device/Increase time);With rails;HOB flat Transfers Transfers: Sit to Stand;Stand to Sit Sit to Stand: 6: Modified independent (Device/Increase time);With upper extremity assist;From bed Stand to Sit: 6: Modified independent (Device/Increase time);With upper extremity assist;To chair/3-in-1       Exercises  General Exercises - Upper Extremity Shoulder Flexion: Strengthening;Both;5 reps;Seated;Theraband Theraband Level (Shoulder Flexion): Level 2 (Red) Shoulder Extension: Strengthening;Both;5 reps;Seated;Theraband Theraband Level (Shoulder Extension): Level 2 (Red) Shoulder ABduction: Strengthening;Both;5 reps;Seated;Theraband Theraband Level (Shoulder Abduction): Level 2 (Red) Elbow Flexion: Strengthening;Both;5 reps;Seated;Theraband Theraband Level (Elbow Flexion): Level 2 (Red) Elbow Extension: Strengthening;Both;5 reps;Seated;Theraband   Balance  WFL   End of Session OT - End of Session Equipment Utilized During Treatment: Gait belt Activity Tolerance: Patient tolerated treatment well Patient left: in chair;with call bell/phone within reach Nurse Communication: Mobility status  GO     Richards Pherigo,HILLARY 12/27/2011, 11:28 AM

## 2011-12-27 NOTE — Progress Notes (Addendum)
[facility] PROGRESS NOTE  AYDAN LEVITZ ZOX:[id number] DOB: [birth year] DOA: 12/18/2011 PCP: [name], MD  Assessment/Plan: Principal Problem:  *Pleural effusion Active Problems:  ADENOCARCINOMA  SIRS (systemic inflammatory response syndrome)  Hypotension     Brief patient profile:  56 yowm admitted 12/18/2011 dyspnea, night sweats, dry cough and chest pain. Had several weeks of outpt tx for PNA. Found to have large Lt pleural effusion and PCCM consulted 9/17. Has hx of Stage IIIb rectal cancer s/p resection and s/p completion of chemo October 2012. Found to have pelvic recurrence 12/05/11. PMHx CAD, Hyperlipidemia, Systolic CHF   Cultures / Cytology:  BC x 2 9/16>>>  Fungal / AFB 9/17>>>  Pleural Culture 9/17 > neg  Pleural Cytology>>>reactive mesothelial cells, negative for malignancy  Antibiotics:  Cefepime 9/16 (?CAP)> 9/20 levaquin 9/16>>>    Tests / Events:  CT Abd lung cut July 2013 - no effusion  CT chest 11/02/11 -small left pleural effusion  CT abdomen 12/05/11 - increassing left pleural effusion  CT chest 9/16>>Large left pleural effusion, compressive ATX, small pericardial effusion, coronary calcification, cardiomegaly  Lt thoracentesis 9/17>>1500 ml fluid, protein 5.4, LDH 161, glucose 137, 537 WBC (60N, 13L, 35M, E3)  L pigtail 9/19 >>  SUBJECTIVE/OVERNIGHT/INTERVAL HX  No sob, pain better  Feels stronger      Pleural effusion most likely malignant  -exudative-given prior h/o PNA-suspicous of para pneumonic effusion, however no obvious PNA seen on prior radiologic studies. Pigtail catheter had low output today, per pulmonology  if pleural effusion recurs-will need a VATS procedure to make sure this is not malignant-given h/o colorectal malignancy. Although cytology negative for malignant cells. CT Chest on 9/16-no PE, no obvious parenchymal or pleural lesions.Pleural Fluid cultures neg so far, including AFB smear.  -PCCM and IR following  -Continue  levofloxacin for ~2 weeks total  We'll defer management of pigtail to pulmonary service   Nonsustained ventricular tachycardia  Patient is not a candidate for ICD implantation. No further workup per cardiology.   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 He would like to establish with oncology in [place], [place]. Patient scheduled an appointment with Dr. Mariel Sleet on 01/01/2012 at 11:30 AM.    -Suspicion for malignant pleural effusion-if pleural effusion recurs after removal of the chest tube-will need to consult [facility] for a VATS procedure  6 mm Lt upper lobe pulmonary nodule  Incidental finding on CT chest 11/02/11. Will need outpatient followup.    Dyslipidemia  -c/w statin   SIRS (systemic inflammatory response syndrome)  -resolved  Hypotension  -resolved, BP stilL soft  Coronary artery disease  - aspirin.  Chronic Systolic Heart Failure  -compensated clinically  -Effusion-exudate, not releated to CHF    Disposition:  Remain inpatient , PT eval pending   DVT Prophylaxis:  Prophylactic Lovenox  Code Status:  Full code      HPI/Subjective: No sob, pain better  Feels stronger    Objective: Filed Vitals:   12/26/11 1427 12/26/11 2038 12/27/11 0434 12/27/11 0942  BP: 98/65 103/66 113/73 101/67  Pulse: 99 95 94 90  Temp: 97.6 F (36.4 C) 98.4 F (36.9 C) 97.9 F (36.6 C) 97.4 F (36.3 C)  TempSrc: Oral Oral Oral Oral  Resp: 18 18 16 16   Height:      Weight:   84.097 kg (185 lb 6.4 oz)   SpO2: 99% 98% 99% 96%  Intake/Output Summary (Last 24 hours) at 12/27/11 1055 Last data filed at 12/27/11 0900  Gross per 24 hour  Intake    483 ml  Output   1825 ml  Net  -1342 ml    Exam:  General: Chronically ill appearing white male.  Neuro: Alert and oriented w/out focal def  HEENT: [place], no VJD  Cardiovascular: rrr  Lungs: Clear  bilaterally  Abdomen: Non-tender  Musculoskeletal: intact  Skin: intact  .    Data Reviewed: Basic Metabolic Panel:  Lab 12/26/11 1610 12/24/11 0545 12/23/11 0624 12/22/11 0547 12/21/11 0500  NA 138 133* 133* 136 132*  K 4.0 4.0 4.1 4.2 4.1  CL 98 95* 98 102 99  CO2 29 29 26 26 26   GLUCOSE 128* 126* 145* 142* 117*  BUN 7 5* 5* 7 6  CREATININE 0.59 0.54 0.46* 0.58 0.66  CALCIUM 9.2 9.4 9.0 8.7 8.7  MG -- -- -- -- --  PHOS -- -- -- -- --    Liver Function Tests: No results found for this basename: AST:5,ALT:5,ALKPHOS:5,BILITOT:5,PROT:5,ALBUMIN:5 in the last 168 hours No results found for this basename: LIPASE:5,AMYLASE:5 in the last 168 hours No results found for this basename: AMMONIA:5 in the last 168 hours  CBC:  Lab 12/26/11 0519 12/24/11 0545 12/23/11 0624 12/22/11 0547 12/21/11 0500  WBC 10.4 10.4 11.9* 9.8 10.4  NEUTROABS -- -- -- -- --  HGB 11.6* 11.7* 11.8* 10.5* 10.2*  HCT 36.7* 37.6* 36.6* 33.6* 32.0*  MCV 81.6 82.5 82.2 83.0 82.1  PLT 439* 493* 439* 421* 408*    Cardiac Enzymes: No results found for this basename: CKTOTAL:5,CKMB:5,CKMBINDEX:5,TROPONINI:5 in the last 168 hours BNP (last 3 results)  Basename 11/16/11 1150 11/01/11 1532  PROBNP 229.0* 1658.0*     CBG:  Lab 12/25/11 0621 12/24/11 0636 12/23/11 0605  GLUCAP 128* 128* 148*    Recent Results (from the past 240 hour(s))  CULTURE, BLOOD (ROUTINE X 2)     Status: Normal   Collection Time   12/18/11  8:54 PM      Component Value Range Status Comment   Specimen Description BLOOD ARM RIGHT   Final    Special Requests BOTTLES DRAWN AEROBIC AND ANAEROBIC 10CC   Final    Culture  Setup Time 12/19/2011 01:47   Final    Culture NO GROWTH 5 DAYS   Final    Report Status 12/25/2011 FINAL   Final   CULTURE, BLOOD (ROUTINE X 2)     Status: Normal   Collection Time   12/18/11  9:08 PM      Component Value Range Status Comment   Specimen Description BLOOD HAND LEFT   Final    Special Requests  BOTTLES DRAWN AEROBIC ONLY [facility]   Final    Culture  Setup Time 12/19/2011 01:47   Final    Culture NO GROWTH 5 DAYS   Final    Report Status 12/25/2011 FINAL   Final   GRAM STAIN     Status: Normal   Collection Time   12/19/11 11:45 AM      Component Value Range Status Comment   Specimen Description PLEURAL FLUID LEFT   Final    Special Requests   Final    Gram Stain     Final    Value: RARE WBC PRESENT,BOTH PMN AND MONONUCLEAR     NO ORGANISMS SEEN   Report Status 12/19/2011 FINAL   Final   BODY FLUID CULTURE     Status:  Normal   Collection Time   12/19/11 11:45 AM      Component Value Range Status Comment   Specimen Description PLEURAL FLUID LEFT   Final    Special Requests   Final    Gram Stain     Final    Value: RARE WBC PRESENT,BOTH PMN AND MONONUCLEAR     NO ORGANISMS SEEN     Performed at [facility]   Culture NO GROWTH 3 DAYS   Final    Report Status 12/22/2011 FINAL   Final   AFB CULTURE WITH SMEAR     Status: Normal (Preliminary result)   Collection Time   12/19/11 11:45 AM      Component Value Range Status Comment   Specimen Description PLEURAL FLUID LEFT   Final    Special Requests   Final    ACID FAST SMEAR NO ACID FAST BACILLI SEEN   Final    Culture     Final    Value: CULTURE WILL BE EXAMINED FOR 6 WEEKS BEFORE ISSUING A FINAL REPORT   Report Status PENDING   Incomplete   FUNGUS CULTURE W SMEAR     Status: Normal (Preliminary result)   Collection Time   12/19/11 11:45 AM      Component Value Range Status Comment   Specimen Description PLEURAL FLUID LEFT   Final    Special Requests   Final    Fungal Smear NO YEAST OR FUNGAL ELEMENTS SEEN   Final    Culture CULTURE IN PROGRESS FOR FOUR WEEKS   Final    Report Status PENDING   Incomplete   CULTURE, EXPECTORATED SPUTUM-ASSESSMENT     Status: Normal   Collection Time   12/19/11  2:07 PM      Component Value Range Status Comment   Specimen Description SPUTUM   Final    Special Requests  NONE   Final    Sputum evaluation     Final    Value: MICROSCOPIC FINDINGS SUGGEST THAT THIS SPECIMEN IS NOT REPRESENTATIVE OF LOWER RESPIRATORY SECRETIONS. PLEASE RECOLLECT.     CALLED TO RN J.SMITH AT 1533 BY L.PITT 12/19/11.   Report Status 12/19/2011 FINAL   Final      Studies: Dg Chest 2 View  12/27/2011  *RADIOLOGY REPORT*  Clinical Data: Pleural drain.  Chest tube.  Effusion.  CHEST - 2 VIEW  Comparison: 12/26/2011  Findings: Right Port-A-Cath and left chest tube remain in place, unchanged.  Scarring in the left lung base, stable.  Possible small residual left effusion, stable.  Right lung is clear.  Heart is borderline in size. No pneumothorax.  IMPRESSION: No interval change.   Original Report Authenticated By: Cyndie Chime, M.D.    Dg Chest 2 View  12/26/2011  *RADIOLOGY REPORT*  Clinical Data: Pleural effusion and chest tube  CHEST - 2 VIEW  Comparison: Chest radiograph 12/23/2011,12/22/2011, 12/21/2011.  Findings: Right IJ power Port-A-Cath is in satisfactory position, with the tip in the distal superior vena cava.  A single left-sided chest tube terminates posteriorly in the left mid hemithorax.  The left pleural effusion has significantly decreased in size since the two-view chest radiograph of 12/21/2011, and appears smaller compared to the most recent study of 12/23/2011.  Aeration of the left lung base is improved, suggesting some re-expansion of the atelectatic lung and/or decrease in airspace disease.  The right lung is well expanded and clear.  There is no pleural effusion on the right.  Stable mild cardiomegaly.  Pulmonary vascularity normal.  No acute bony abnormality.  IMPRESSION: 1. Near complete resolution of left pleural effusion, with left chest tube in place.  Improved aeration of the left lung base. 2. Stable mild cardiomegaly.   Original Report Authenticated By: Britta Mccreedy, M.D.    Dg Chest 2 View  12/21/2011  *RADIOLOGY REPORT*  Clinical Data: Follow up left pleural  effusion  CHEST - 2 VIEW  Comparison: 12/19/2011  Findings: Right chest wall porta-catheter is identified with tip in the cavoatrial junction.  There is a large left pleural effusion which is not significantly changed from previous exam.  Right lung is clear.  IMPRESSION:  1.  No change in volume of left pleural effusion.   Original Report Authenticated By: Rosealee Albee, M.D.    Dg Chest 2 View  12/18/2011  *RADIOLOGY REPORT*  Clinical Data: Short of breath and pneumonia  CHEST - 2 VIEW  Comparison: 11/29/2011  Findings: Large left effusion has progressed from the  prior study. There is collapse of the left lower lobe.  Right lung is clear. Port-A-Cath tip remains in the lower SVC.  No heart failure.  IMPRESSION: Large left effusion has progressed from the prior study.  There is collapse of the left lower lobe.  Thoracentesis is suggested to rule out underlying malignancy or infection.   Original Report Authenticated By: Camelia Phenes, M.D.    Dg Chest 2 View  11/29/2011  *RADIOLOGY REPORT*  Clinical Data: Cough, shortness of breath and chest pain.  CHEST - 2 VIEW  Comparison: CT chest 11/02/2011 and chest radiograph 11/01/2011.  Findings: Trachea is midline.  Heart size stable.  Right IJ power port tip projects over the SVC.  Small left pleural effusion and left basilar airspace disease are new.  IMPRESSION: Small left pleural effusion with left basilar airspace disease.   Original Report Authenticated By: Reyes Ivan, M.D.    Ct Angio Chest Pe W/cm &/or Wo Cm  12/18/2011  *RADIOLOGY REPORT*  Clinical Data: Chest pain and shortness of breath.  History of colon cancer.  CT ANGIOGRAPHY CHEST  Technique:  Multidetector CT imaging of the chest using the standard protocol during bolus administration of intravenous contrast. Multiplanar reconstructed images including MIPs were obtained and reviewed to evaluate the vascular anatomy.  Contrast: OMNIPAQUE IOHEXOL 350 MG/ML SOLN  Comparison: PA and  lateral chest earlier this same day and CT chest 11/02/2011.  Findings: No pulmonary embolus is identified.  Since the prior CT scan, the small left pleural effusion seen on the prior study has increased in size and now nearly fills the left chest.  Trace amount of pleural fluid seen on the prior study has resolved. There is a small pericardial effusion, increased in size since the prior study.  Cardiomegaly is noted.  There are some small mediastinal lymph nodes but no pathologic lymphadenopathy by CT size criteria in the axilla, hila or mediastinum is identified. Coronary atherosclerotic vascular disease is noted.  The lungs demonstrate mark compressive atelectasis of the left lung.  The right lung is clear.  Incidentally imaged upper abdomen is unremarkable.  No focal bony abnormality is identified.  IMPRESSION:  1.  Negative for pulmonary embolus. 2.  Marked increase in a left pleural effusion which now nearly fills the left chest.  Associated compressive atelectasis of the left lung is seen. 3.  New very small pericardial effusion. 4.  Calcific coronary artery disease.  Cardiomegaly also noted.   Original Report  Authenticated By: Bernadene Bell. Maricela Curet, M.D.    Ct Guided Abscess Drain  12/21/2011  *RADIOLOGY REPORT*  Clinical Data/Indication: LEFT PLEURAL EFFUSION  CT GUIDED ABCESS DRAINAGE WITH CATHETER  Sedation: Versed 2.0 mg, Fentanyl 100 mg.  Total Moderate Sedation Time: 21 minutes.  Procedure: The procedure, risks, benefits, and alternatives were explained to the patient. Questions regarding the procedure were encouraged and answered. The patient understands and consents to the procedure.  The left anterolateral chest was prepped with betadine in a sterile fashion, and a sterile drape was applied covering the operative field. A sterile gown and sterile gloves were used for the procedure.  Under CT guidance, an 19 gauge needle was inserted into the left pleural space via left anterior axillary line  approach.  It was removed over an 0038 wire. Successive 12 and 18-French dilators were inserted.  A 16-French Thal-Quik drain was then advanced over the wire coiled in the pleural fluid.  It was then attached to a Pleur-Evac at 20 cm water suction.  Findings: Imaging demonstrates placement of a left 16-French thoracostomy tube.  Complications: None.  IMPRESSION: Successful left  16-French thoracostomy tube placement.   Original Report Authenticated By: Donavan Burnet, M.D.    Ct Abdomen Pelvis W Contrast  12/05/2011  *RADIOLOGY REPORT*  Clinical Data: Rectal carcinoma.  Lower abdominal pain.  Pelvic fluid collection.  CT ABDOMEN AND PELVIS WITH CONTRAST  Technique:  Multidetector CT imaging of the abdomen and pelvis was performed following the standard protocol during bolus administration of intravenous contrast.  Contrast: 80mL OMNIPAQUE IOHEXOL 300 MG/ML  SOLN  Comparison: 10/09/2011  Findings: Images through the lung bases show a moderate left-sided pleural effusion and left lower lobe atelectasis.  Hepatic cirrhosis is again noted, however no liver masses are identified.  The other abdominal parenchymal organs are normal in appearance.  Gallbladder is unremarkable.  No evidence of hydronephrosis.  A large partially necrotic soft tissue mass is again seen involving the rectum, with extension to involve the left pelvic sidewall and base of the urinary bladder.  This measures approximately 8.6 x 10.4 cm maximum dimensions, without significant change in size since prior study.  Asymmetric diffuse wall thickening of the urinary bladder is also stable in appearance, which may be due to tumor, muscular hypertrophy, or cystitis.  Shotty less than 1 cm lymph nodes are again seen in the central sigmoid mesentery and right lower quadrant small bowel mesentery which are stable.  Shotty less than 1 cm retroperitoneal lymph nodes in the aortocaval space are also stable.  No new or worsening areas of lymphadenopathy  identified.  No evidence of inflammatory process or abnormal fluid collections. No evidence of bowel obstruction.  A left lower quadrant colostomy is again demonstrated.  No suspicious bone lesions identified.  IMPRESSION:  1.  No significant change in size of large, partially necrotic rectal mass, with involvement of the left pelvic side wall and base of the urinary bladder. 2.  Stable shotty sigmoid and small bowel mesenteric lymphadenopathy, and retroperitoneal lymphadenopathy. 3.  Increased moderate left pleural effusion and left lower lobe atelectasis. 4.  Stable hepatic cirrhosis.  No evidence of hepatic neoplasm, ascites, or abscess.   Original Report Authenticated By: Danae Orleans, M.D.    Ct Aspiration  12/05/2011  *RADIOLOGY REPORT*  CT GUIDED ASPIRATION  Date: 12/05/2011  Clinical History: 56 year old male with a history of advanced rectal cancer status post APR and adjuvant CTX with a persistent fluid and gas collection in the  surgical bed concerning for recurrent necrotic tumor versus abscess. The CEA level has been rising.  Procedures Performed: 1. CT guided aspiration  Interventional Radiologist:  Sterling Big, MD  Sedation: Moderate (conscious) sedation was used.  1.5 mg Versed, 50 mcg Fentanyl were administered intravenously.  The patient's vital signs were monitored continuously by radiology nursing throughout the procedure.  Sedation Time: 22 minutes  Fluoroscopy time: 10 seconds  PROCEDURE/FINDINGS:   Informed consent was obtained from the patient following explanation of the procedure, risks, benefits and alternatives. The patient understands, agrees and consents for the procedure. All questions were addressed. A time out was performed.  Maximal barrier sterile technique utilized including caps, mask, sterile gowns, sterile gloves, large sterile drape, hand hygiene, and betadine skin prep.  A planning axial CT scan was performed.  An appropriate skin entry site was selected and marked.   Local anesthesia was obtained with infiltration of 1% lidocaine.  Under CT fluoroscopic guidance, an 18 gauge trocar needle was advanced to the skin and soft tissues into the fluid and gas collection in the deep anatomic pelvis.  A very small amount (8 ml) of bloody fluid containing chunks of caseous material was successfully aspirated.  This material was divided and sent to microbiology for culture, as well as pathology for pathologic analysis.  The needle was manipulated in multiple directions in an effort to aspirate additional material.  This was not successful.  Location of the needle was confirmed with axial CT imaging.  Given the imaging findings, and lack of purulent return, it is felt that this lesion represents a centrally necrotic recurrent tumor. Therefore, the needle was withdrawn.  No abscess drain was placed. The patient tolerated the procedure well, there was no immediate complication.  After appropriate observation period, the patient was discharged home.  IMPRESSION:  Technically successful CT guided aspiration of a small amount of bloody fluid and caseous material from within the deep pelvic necrotic mass.  The material was divided and sent to microbiology for culture, and pathology for pathologic evaluation.  No percutaneous drain was placed.  These results were called by telephone on 12/05/2011 at 03:45 p.m. to Dr. [name], who verbally acknowledged these results.  Signed,  Sterling Big, MD Vascular & Interventional Radiologist [facility] Radiology   Original Report Authenticated By: Alvino Blood Chest Port 1 View  12/23/2011  *RADIOLOGY REPORT*  Clinical Data: Evaluate left-sided chest tube  PORTABLE CHEST - 1 VIEW  Comparison: 12/22/2011; 12/21/2011; 12/19/2011; CT guided thoracostomy tube placement  Findings: Grossly unchanged large cardiac silhouette and mediastinal contours.  Stable position of support apparatus. Interval reaccumulation of a small left-sided pleural effusion and  left mid and lower lung heterogeneous / consolidative opacities post left-sided thoracotomy tube placement. Increased right infrahilar heterogeneous opacities.  No pneumothorax.  Unchanged bones.  IMPRESSION: 1.  Stable positioning of support apparatus. No pneumothorax.  2.  Interval reaccumulation of small left-sided effusion with associated left mid and lower lung opacities, favored to represent atelectasis. 2.  Worsening right infrahilar opacities favored to represent atelectasis.   Original Report Authenticated By: Waynard Reeds, M.D.    Dg Chest Port 1 View  12/22/2011  *RADIOLOGY REPORT*  Clinical Data: Chest tube.  Shortness of breath.  PORTABLE CHEST - 1 VIEW  Comparison: Yesterday.  Findings: Interval small caliber left chest tube.  The previously seen left pleural fluid is no longer present.  Patchy airspace opacity in the left mid and lower lung zones.  Clear right lung. Right jugular porta-catheter, unchanged.  Enlarged cardiac silhouette.  Unremarkable bones.  IMPRESSION:  1.  Interval resolution of the left pleural fluid following chest tube placement. 2.  Patchy airspace opacity in the left mid and lower lung zones. This could represent pneumonia, re-expansion pulmonary edema or residual atelectasis. 3.  Cardiomegaly.   Original Report Authenticated By: Darrol Angel, M.D.    Dg Chest Port 1 View  12/19/2011  *RADIOLOGY REPORT*  Clinical Data: Left chest pain following thoracentesis.  PORTABLE CHEST - 1 VIEW  Comparison: Portable film earlier in the day.  Findings: Moderate left pleural effusion persists.  Compressive atelectasis left base.  Cardiomegaly.  No pneumothorax.  Right lung shows mild vascular congestion.  Unchanged Port-A-Cath. Unremarkable osseous structures.  IMPRESSION: Moderate left effusion persists.  There is no pneumothorax or significant change from earlier in the day.   Original Report Authenticated By: Elsie Stain, M.D.    Dg Chest Port 1 View  12/19/2011   *RADIOLOGY REPORT*  Clinical Data: Left thoracentesis follow-up.  PORTABLE CHEST - 1 VIEW  Comparison: 12/18/2011 CT  Findings: Decrease in size of the left pleural effusion. Hazy appearance to the left aerated portion of the lung may correspond to superimposed layering fluid in the pleural space or airspace edema.  No pneumothorax visualized.  Cardiomegaly.  Right Port-A- Cath with tip projecting over the cavoatrial junction.  No interval osseous change.  IMPRESSION: Decreased size of the left pleural effusion status post thoracentesis.  Hazy appearance to the left lung may reflect edema or superimposed layering pleural fluid.  No pneumothorax.   Original Report Authenticated By: Waneta Martins, M.D.     Scheduled Meds:   . aspirin EC  81 mg Oral Daily  . enoxaparin (LOVENOX) injection  40 mg Subcutaneous Q24H  . levofloxacin  750 mg Oral QHS  . pantoprazole  40 mg Oral Daily  . simvastatin  10 mg Oral QHS  . sodium chloride  3 mL Intravenous Q12H  . vitamin B-12  1,000 mcg Oral Daily  . vitamin C  500 mg Oral Daily   Continuous Infusions:   Principal Problem:  *Pleural effusion Active Problems:  ADENOCARCINOMA  SIRS (systemic inflammatory response syndrome)  Hypotension    Time spent: 40 minutes   [facility]  [facility] Pager [phone]. If 8PM-8AM, please contact night-coverage at [email or web address], password [facility] 12/27/2011, 10:55 AM  LOS: 9 days

## 2011-12-27 NOTE — Progress Notes (Signed)
I cosign for Adam Henderson's assessment, med administration, intake and output, and notes.  

## 2011-12-27 NOTE — Progress Notes (Signed)
Name: Adam Henderson MRN: [id number] DOB: 03/27/1956   Date of admit: 12/18/2011   LOS: 9 Consulting MD:  Dr. Betti Cruz Reason for Consult:  Pleural Effusion Eval   Brief patient profile:   56 yowm admitted 12/18/2011 dyspnea, night sweats, dry cough and chest pain.  Had several weeks of outpt tx for PNA.  Found to have large Lt pleural effusion and PCCM consulted 9/17. Has hx of Stage IIIb rectal cancer s/p resection and s/p completion of chemo October 2012.  Found to have pelvic recurrence 12/05/11. PMHx CAD, Hyperlipidemia, Systolic CHF        Cultures / Cytology: BC x 2 9/16>>> Fungal / AFB 9/17>>> Pleural Culture 9/17 > neg Pleural Cytology>>>reactive mesothelial cells, negative for malignancy  Antibiotics: Cefepime 9/16 (?CAP)> 9/20 levaquin 9/16>>>  Tests / Events: CT Abd lung cut July 2013 - no effusion CT chest 11/02/11 -small left pleural effusion CT abdomen 12/05/11 - increassing left pleural effusion CT chest 9/16>>Large left pleural effusion, compressive ATX, small pericardial effusion, coronary calcification, cardiomegaly Lt thoracentesis 9/17>>1500 ml fluid, protein 5.4, LDH 161, glucose 137, 537 WBC (60N, 13L, 24M, E3) L pigtail 9/19 >> 9/25    SUBJECTIVE/OVERNIGHT/INTERVAL HX No sob, pain better  Feels stronger    Vital Signs: BP 101/67  Pulse 90  Temp 97.4 F (36.3 C) (Oral)  Resp 16  Ht 5\' 9"  (1.753 m)  Wt 84.097 kg (185 lb 6.4 oz)  BMI 27.38 kg/m2  SpO2 96% Room air   Intake/Output Summary (Last 24 hours) at 12/27/11 1201 Last data filed at 12/27/11 0900  Gross per 24 hour  Intake    483 ml  Output   1825 ml  Net  -1342 ml      Intake/Output      09/24 0701 - 09/25 0700 09/25 0701 - 09/26 0700   P.O. 1260    I.V. (mL/kg) 3 (0)    Total Intake(mL/kg) 1263 (15)    Urine (mL/kg/hr) 1625 (0.8) 400 (0.9)   Drains     Stool     Total Output 1625 400   Net -362 -400           Physical Examination: General:  Chronically ill appearing  white male.   Neuro:  Alert and oriented w/out focal def HEENT:  [place], no VJD Cardiovascular:  rrr Lungs:  Clear bilaterally, CT w/ no airleak and minimal output  Abdomen:  Non-tender Musculoskeletal:  intact Skin: intact    Labs and Imaging:   Lab 12/26/11 0519 12/24/11 0545 12/23/11 0624  NA 138 133* 133*  K 4.0 4.0 4.1  CL 98 95* 98  CO2 29 29 26   BUN 7 5* 5*  CREATININE 0.59 0.54 0.46*  GLUCOSE 128* 126* 145*    Lab 12/26/11 0519 12/24/11 0545 12/23/11 0624  HGB 11.6* 11.7* 11.8*  HCT 36.7* 37.6* 36.6*  WBC 10.4 10.4 11.9*  PLT 439* 493* 439*    Lab 12/26/11 0519 12/24/11 0545 12/23/11 0624 12/22/11 0547 12/21/11 0500  PROCALCITON -- -- -- -- <0.10  WBC 10.4 10.4 11.9* 9.8 --  LATICACIDVEN -- -- -- -- --        PCXR: improved aeration. Decreased effusion. Left CT in place.   Assessment and Plan:  Large Left pleural effusion. Probable L parapneumonic effusion likely d/t PNA vs malignant (cytology from pleural fluid is an insensitive test)  . S/p Pigtail cath placement on 9/19  - on 9/24 and 9/25 has had minimal pleural  fluid output Plan: - complete total 2 weeks monotherapy w/ levaquin  - will talk to IR, think CT can be pulled.  -will f/u cxr in am -have set him up w/ f/u with [place] on 01/11/12 with Tammy Parrett and after that with DR Marchelle Gearing or Dr Craige Cotta   6 mm Lt upper lobe pulmonary nodule.  Incidental finding on CT chest 11/02/11. Plan: -Will need outpt follow up  Metastatic colon cancer. Plan: -Pt requesting referral to new oncologist>>defer decision to primary team.    CAD.  Chronic systolic CHF Hyperlipidemia.   Plan: -per primary team -[facility] following     12/27/2011 12:01 PM      STAFF NOTE: I, Dr Lavinia Sharps have personally reviewed patient's available data, including medical history, events of note, physical examination and test results as part of my evaluation. I have discussed with resident/NP and other care  providers such as pharmacist, RN and RRT.  In addition,  I personally evaluated patient and elicited key findings of  Idiopathic exudate left chest tube removed. FU with NP at pulm office. IF effusion recurs, needs VATS.  Rest per NP/medical resident whose note is outlined above and that I agree with     Dr. Kalman Shan, M.D., [facility].C.P Pulmonary and Critical Care Medicine Staff Physician [facility] Pager: [phone], If no answer or between  15:00h - 7:00h: call [phone]  12/27/2011 3:24 PM

## 2011-12-27 NOTE — Progress Notes (Signed)
Subjective: Pt eating lunch, doing well; breathing has improved; has ambulated in hallway  Objective: Vital signs in last 24 hours: Temp:  [97.4 F (36.3 C)-98.4 F (36.9 C)] 97.4 F (36.3 C) (09/25 0942) Pulse Rate:  [90-99] 90  (09/25 0942) Resp:  [16-18] 16  (09/25 0942) BP: (98-113)/(65-73) 101/67 mmHg (09/25 0942) SpO2:  [96 %-99 %] 96 % (09/25 0942) Weight:  [185 lb 6.4 oz (84.097 kg)] 185 lb 6.4 oz (84.097 kg) (09/25 0434) Last BM Date: 12/27/11  Intake/Output from previous day: 09/24 0701 - 09/25 0700 In: 1263 [P.O.:1260; I.V.:3] Out: 1625 [Urine:1625] Intake/Output this shift: Total I/O In: -  Out: 515 [Urine:515]  Left chest drain intact, output minimal, no air leak  Lab Results:   Basename 12/26/11 0519  WBC 10.4  HGB 11.6*  HCT 36.7*  PLT 439*   BMET  Basename 12/26/11 0519  NA 138  K 4.0  CL 98  CO2 29  GLUCOSE 128*  BUN 7  CREATININE 0.59  CALCIUM 9.2   PT/INR No results found for this basename: LABPROT:2,INR:2 in the last 72 hours ABG No results found for this basename: PHART:2,PCO2:2,PO2:2,HCO3:2 in the last 72 hours  Studies/Results: Dg Chest 2 View  12/27/2011  *RADIOLOGY REPORT*  Clinical Data: Pleural drain.  Chest tube.  Effusion.  CHEST - 2 VIEW  Comparison: 12/26/2011  Findings: Right Port-A-Cath and left chest tube remain in place, unchanged.  Scarring in the left lung base, stable.  Possible small residual left effusion, stable.  Right lung is clear.  Heart is borderline in size. No pneumothorax.  IMPRESSION: No interval change.   Original Report Authenticated By: Cyndie Chime, M.D.    Dg Chest 2 View  12/26/2011  *RADIOLOGY REPORT*  Clinical Data: Pleural effusion and chest tube  CHEST - 2 VIEW  Comparison: Chest radiograph 12/23/2011,12/22/2011, 12/21/2011.  Findings: Right IJ power Port-A-Cath is in satisfactory position, with the tip in the distal superior vena cava.  A single left-sided chest tube terminates posteriorly in  the left mid hemithorax.  The left pleural effusion has significantly decreased in size since the two-view chest radiograph of 12/21/2011, and appears smaller compared to the most recent study of 12/23/2011.  Aeration of the left lung base is improved, suggesting some re-expansion of the atelectatic lung and/or decrease in airspace disease.  The right lung is well expanded and clear.  There is no pleural effusion on the right.  Stable mild cardiomegaly.  Pulmonary vascularity normal.  No acute bony abnormality.  IMPRESSION: 1. Near complete resolution of left pleural effusion, with left chest tube in place.  Improved aeration of the left lung base. 2. Stable mild cardiomegaly.   Original Report Authenticated By: Britta Mccreedy, M.D.     Anti-infectives: Anti-infectives     Start     Dose/Rate Route Frequency Ordered Stop   12/21/11 2200   levofloxacin (LEVAQUIN) tablet 750 mg        750 mg Oral Daily at bedtime 12/21/11 1431 12/29/11 2159   12/19/11 0800   vancomycin (VANCOCIN) IVPB 1000 mg/200 mL premix  Status:  Discontinued        1,000 mg 200 mL/hr over 60 Minutes Intravenous Every 8 hours 12/19/11 0106 12/21/11 1342   12/19/11 0115   vancomycin (VANCOCIN) IVPB 1000 mg/200 mL premix        1,000 mg 200 mL/hr over 60 Minutes Intravenous NOW 12/19/11 0106 12/19/11 0218   12/19/11 0045   ceFEPIme (MAXIPIME) 1 g in dextrose  5 % 50 mL IVPB  Status:  Discontinued        1 g 100 mL/hr over 30 Minutes Intravenous 3 times per day 12/19/11 0035 12/22/11 1705   12/19/11 0045   levofloxacin (LEVAQUIN) IVPB 750 mg        750 mg 100 mL/hr over 90 Minutes Intravenous Every 24 hours 12/19/11 0035 12/20/11 2149   12/18/11 2100   piperacillin-tazobactam (ZOSYN) IVPB 4.5 g        4.5 g 200 mL/hr over 30 Minutes Intravenous  Once 12/18/11 2042 12/18/11 2319   12/18/11 2045   levofloxacin (LEVAQUIN) IVPB 750 mg  Status:  Discontinued        750 mg 100 mL/hr over 90 Minutes Intravenous Every 24 hours  12/18/11 2042 12/19/11 0106          Assessment/Plan: s/p left chest drain 9/19 for parapneumonic effusion; per CCM request, left chest drain removed in its entirety without immediate complications and vaseline gauze dressing applied to site. Further plans as per CCM.           LOS: 9 days    Licet Dunphy,D [facility] 12/27/2011

## 2011-12-27 NOTE — Progress Notes (Signed)
Occupational Therapy Evaluation Patient Details Name: KIAI PEACE MRN: [id number] DOB: 1956-03-03 Today's Date: 12/27/2011 Time: 4696-2952 OT Time Calculation (min): 12 min  OT Assessment / Plan / Recommendation Clinical Impression  56 yo with hx of rectal cancer with mets to peritoneum. Admitted with pleural effusion, SIRS, hypotension. Pt will benefit from OT to max independence with ADL and functional moiblity to facilitate safe return home due to below deficits. Mother is able to assist as needed.     OT Assessment  Patient needs continued OT Services    Follow Up Recommendations  No OT follow up    Barriers to Discharge None    Equipment Recommendations  None recommended by OT    Recommendations for Other Services    Frequency  Min 2X/week    Precautions / Restrictions Precautions Precaution Comments: chest tube Restrictions Weight Bearing Restrictions: No   Pertinent Vitals/Pain Discomfort from chest tube    ADL  Eating/Feeding: Simulated;Independent Where Assessed - Eating/Feeding: Edge of bed Grooming: Simulated;Modified independent Where Assessed - Grooming: Supine, head of bed up Upper Body Bathing: Simulated;Modified independent Where Assessed - Upper Body Bathing: Unsupported sitting Lower Body Bathing: Simulated;Moderate assistance Where Assessed - Lower Body Bathing: Supported sit to stand Upper Body Dressing: Simulated;Set up Where Assessed - Upper Body Dressing: Unsupported sitting Lower Body Dressing: Simulated;Moderate assistance Where Assessed - Lower Body Dressing: Supported sit to stand Transfers/Ambulation Related to ADLs: not assessed due to pt requesting to empty colostomy bag prior to transfers ADL Comments: limited by chest tubew    OT Diagnosis: Generalized weakness;Acute pain  OT Problem List: Decreased strength;Decreased activity tolerance;Decreased knowledge of use of DME or AE;Pain OT Treatment Interventions:     OT Goals Acute  Rehab OT Goals OT Goal Formulation: With patient Time For Goal Achievement: 01/03/12 Potential to Achieve Goals: Good ADL Goals Additional ADL Goal #1: Verbalize understaninding of E conservation techniques to increase independence with ADL and IADL. ADL Goal: Additional Goal #1 - Progress: Goal set today Additional ADL Goal #2: Demonstrate functional mobility @ RW level within room @ MOdI level. ADL Goal: Additional Goal #2 - Progress: Goal set today Arm Goals Pt Will Complete Theraband Exer: Independently;to increase strength;Bilateral upper extremities;2 sets;Level 2 Theraband Arm Goal: Theraband Exercises - Progress: Goal set today  Visit Information  Last OT Received On: 12/27/11    Subjective Data      Prior Functioning  Vision/Perception  Home Living Lives With: Family;Other (Comment) (lives with mother) Available Help at Discharge: Available PRN/intermittently;Other (Comment) (mother has breast CA) Type of Home: House Home Access: Stairs to enter [employer] of Steps: 1 Entrance Stairs-Rails: None Home Layout: One level Bathroom Shower/Tub: Walk-in shower;Door [employer] Toilet: Standard Bathroom Accessibility: Yes Home Adaptive Equipment: Grab bars in shower;Straight cane;Built-in shower seat;Walker - rolling Prior Function Level of Independence: Independent Able to Take Stairs?: No Driving: Yes 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: No difficulties Dominant Hand: Right      Cognition  Overall Cognitive Status: Appears within functional limits for tasks assessed/performed Arousal/Alertness: Awake/alert Orientation Level: Appears intact for tasks assessed Behavior During Session: [facility] for tasks performed    Extremity/Trunk Assessment Right Upper Extremity Assessment RUE ROM/Strength/Tone: Deficits RUE ROM/Strength/Tone Deficits: WFL, arthritic pain in shoulders RUE Sensation: WFL -  Light Touch;WFL - Proprioception RUE Coordination: WFL - gross/fine motor Left Upper Extremity Assessment LUE ROM/Strength/Tone: Deficits LUE ROM/Strength/Tone Deficits: same as R LUE Sensation: WFL - Light Touch;WFL - Proprioception LUE Coordination:  WFL - gross/fine motor Right Lower Extremity Assessment RLE ROM/Strength/Tone: Deficits;Due to pain (pain in ankle/calf from CABG) RLE Sensation: Deficits RLE Sensation Deficits: numbness Left Lower Extremity Assessment LLE ROM/Strength/Tone: WFL for tasks assessed Trunk Assessment Trunk Assessment: Normal   Mobility            Exercise     Balance  WFL   End of Session OT - End of Session Activity Tolerance: Patient tolerated treatment well Patient left: in bed;with call bell/phone within reach Nurse Communication: Mobility status  GO     Ariea Rochin,HILLARY 12/27/2011, 11:22 AM Luisa Dago, OTR/L  [phone] 12/27/2011

## 2011-12-27 NOTE — Evaluation (Signed)
Physical Therapy Evaluation Patient Details Name: Adam Henderson MRN: [id number] DOB: July 27, 1955 Today's Date: 12/27/2011 Time: 1478-2956 PT Time Calculation (min): 12 min  PT Assessment / Plan / Recommendation Clinical Impression  Patient s/p pleural effusion with chest tube.  Will benefit from PT to address endurance and balance issues.  Patient will need HHPT f/u.      PT Assessment  Patient needs continued PT services    Follow Up Recommendations  Home health PT;Supervision - Intermittent    Barriers to Discharge Decreased caregiver support      Equipment Recommendations  None recommended by PT    Recommendations for Other Services     Frequency Min 3X/week    Precautions / Restrictions Precautions Precautions: Fall Precaution Comments: chest tube Restrictions Weight Bearing Restrictions: No   Pertinent Vitals/Pain VSS, no pain      Mobility  Bed Mobility Bed Mobility: Supine to Sit Supine to Sit: 6: Modified independent (Device/Increase time);With rails;HOB flat Transfers Transfers: Sit to Stand;Stand to Sit Sit to Stand: 6: Modified independent (Device/Increase time);With upper extremity assist;From bed Stand to Sit: 6: Modified independent (Device/Increase time);With upper extremity assist;With armrests;To chair/3-in-1 Ambulation/Gait Ambulation/Gait Assistance: 4: Min guard Ambulation Distance (Feet): 150 Feet Assistive device: Rolling walker Ambulation/Gait Assistance Details: Ambulated well with RW.  Encouraged patient to use initially on d/c for safety as patient is weak and unsteady as he fatigues secondary to right LE weakness.   Gait Pattern: Step-through pattern;Decreased stride length;Decreased step length - right;Decreased dorsiflexion - right Stairs: No Wheelchair Mobility Wheelchair Mobility: No    Shoulder Instructions     Exercises General Exercises - Upper Extremity Shoulder Flexion: Strengthening;Both;5 reps;Seated;Theraband Theraband  Level (Shoulder Flexion): Level 2 (Red) Shoulder Extension: Strengthening;Both;5 reps;Seated;Theraband Theraband Level (Shoulder Extension): Level 2 (Red) Shoulder ABduction: Strengthening;Both;5 reps;Seated;Theraband Theraband Level (Shoulder Abduction): Level 2 (Red) Elbow Flexion: Strengthening;Both;5 reps;Seated;Theraband Theraband Level (Elbow Flexion): Level 2 (Red) Elbow Extension: Strengthening;Both;5 reps;Seated;Theraband   PT Diagnosis: Generalized weakness  PT Problem List: Decreased activity tolerance;Decreased balance;Decreased mobility;Decreased safety awareness;Decreased knowledge of use of DME PT Treatment Interventions: DME instruction;Gait training;Stair training;Functional mobility training;Therapeutic exercise;Balance training;Therapeutic activities;Patient/family education   PT Goals Acute Rehab PT Goals PT Goal Formulation: With patient Time For Goal Achievement: 01/03/12 Potential to Achieve Goals: Good Pt will Ambulate: >150 feet;with modified independence;with least restrictive assistive device PT Goal: Ambulate - Progress: Goal set today Pt will Go Up / Down Stairs: 1-2 stairs;with supervision;with least restrictive assistive device PT Goal: Up/Down Stairs - Progress: Goal set today  Visit Information  Last PT Received On: 12/27/11 Assistance Needed: +1 PT/OT Co-Evaluation/Treatment: Yes    Subjective Data  Subjective: "I want to try and walk." Patient Stated Goal: To go home   Prior Functioning  Home Living Lives With: Family (lives with mother) Available Help at Discharge: Available PRN/intermittently (mother has breast CA) Type of Home: House Home Access: Stairs to enter Secretary/administrator of Steps: 1 Entrance Stairs-Rails: None Home Layout: One level Bathroom Shower/Tub: Walk-in shower;Door [employer] Toilet: Standard Bathroom Accessibility: Yes Home Adaptive Equipment: Grab bars in shower;Straight cane;Built-in shower seat;Walker -  rolling Prior Function Level of Independence: Independent Able to Take Stairs?: No Driving: Yes 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[employer], 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 Dominant Hand: Right    Cognition  Overall Cognitive Status: Appears within functional limits for tasks assessed/performed Arousal/Alertness: Awake/alert Orientation Level: Appears intact for tasks assessed Behavior During Session: [facility] for tasks performed    Extremity/Trunk Assessment Right Upper  Extremity Assessment RUE ROM/Strength/Tone: Deficits RUE ROM/Strength/Tone Deficits: See OT note RUE Sensation: WFL - Light Touch;WFL - Proprioception RUE Coordination: WFL - gross/fine motor Left Upper Extremity Assessment LUE ROM/Strength/Tone: Deficits LUE ROM/Strength/Tone Deficits: see OT note LUE Sensation: WFL - Light Touch;WFL - Proprioception LUE Coordination: WFL - gross/fine motor Right Lower Extremity Assessment RLE ROM/Strength/Tone: Deficits;Due to pain RLE ROM/Strength/Tone Deficits: pain in ankle/calf after CABG, noted some foot drop and knee hyperextension RLE Sensation: Deficits RLE Sensation Deficits: numbness Left Lower Extremity Assessment LLE ROM/Strength/Tone: WFL for tasks assessed Trunk Assessment Trunk Assessment: Normal   Balance    End of Session PT - End of Session Equipment Utilized During Treatment: Gait belt Activity Tolerance: Patient tolerated treatment well Patient left: in chair;with call bell/phone within reach Nurse Communication: Mobility status      INGOLD,Shainna Faux 12/27/2011, 11:47 AM  [facility] [phone] (pager)

## 2011-12-28 ENCOUNTER — Inpatient Hospital Stay (HOSPITAL_COMMUNITY): Payer: BC Managed Care – PPO

## 2011-12-28 MED ORDER — ALBUTEROL SULFATE (5 MG/ML) 0.5% IN NEBU: 2.5000 mg | INHALATION_SOLUTION | RESPIRATORY_TRACT | Status: DC | PRN

## 2011-12-28 MED ORDER — LEVOFLOXACIN 750 MG PO TABS: 750.0000 mg | ORAL_TABLET | Freq: Every day | ORAL | Status: AC

## 2011-12-28 MED ORDER — HYDROCODONE-ACETAMINOPHEN 5-325 MG PO TABS: 1.0000 | ORAL_TABLET | ORAL | Status: DC | PRN

## 2011-12-28 MED ORDER — INFLUENZA VIRUS VACC SPLIT PF IM SUSP
0.5000 mL | Freq: Once | INTRAMUSCULAR | Status: AC
  Administered 2011-12-28: 0.5 mL via INTRAMUSCULAR
  Filled 2011-12-28: qty 0.5

## 2011-12-28 MED ORDER — HEPARIN SOD (PORK) LOCK FLUSH 100 UNIT/ML IV SOLN
500.0000 [IU] | INTRAVENOUS | Status: DC
  Administered 2011-12-28: 500 [IU]
  Filled 2011-12-28: qty 5

## 2011-12-28 MED ORDER — HEPARIN SOD (PORK) LOCK FLUSH 100 UNIT/ML IV SOLN
500.0000 [IU] | INTRAVENOUS | Status: DC | PRN
  Filled 2011-12-28: qty 5

## 2011-12-28 MED ORDER — ALBUTEROL SULFATE HFA 108 (90 BASE) MCG/ACT IN AERS: 2.0000 | INHALATION_SPRAY | Freq: Four times a day (QID) | RESPIRATORY_TRACT | Status: DC | PRN

## 2011-12-28 MED ORDER — GUAIFENESIN-DM 100-10 MG/5ML PO SYRP: 5.0000 mL | ORAL_SOLUTION | Freq: Four times a day (QID) | ORAL | Status: AC | PRN

## 2011-12-28 NOTE — Progress Notes (Signed)
Ambulated with patient in hallway. Patient on RA, O2 STATs 100%  Will continue to monitor.

## 2011-12-28 NOTE — Progress Notes (Signed)
Paged Dr. Susie Cassette to get an order for pt to have flu vaccine before D/C today.

## 2011-12-28 NOTE — Progress Notes (Signed)
12/28/11 1148 Tera Mater, RN, BSN NCM [phone] Noted new orders for [facility] PT/OT and DME nebulizer.  TC to Hilda Lias, with [facility] to give updated orders, and TC to [facility], with [facility] to give referral for DME.  Pt. has dc orders, and will be going home with family.

## 2011-12-28 NOTE — Progress Notes (Signed)
Pt given DC instructions and verbalized understanding.  Pt DC home via wc.  

## 2011-12-28 NOTE — Progress Notes (Addendum)
Name: Adam Henderson MRN: [id number] DOB: 11/21/1955   Date of admit: 12/18/2011   LOS: 10 Consulting MD:  Dr. Betti Cruz Reason for Consult:  Pleural Effusion Eval   Brief patient profile:   56 yowm admitted 12/18/2011 dyspnea, night sweats, dry cough and chest pain.  Had several weeks of outpt tx for PNA.  Found to have large Lt pleural effusion and PCCM consulted 9/17. Has hx of Stage IIIb rectal cancer s/p resection and s/p completion of chemo October 2012.  Found to have pelvic recurrence 12/05/11. PMHx CAD, Hyperlipidemia, Systolic CHF        Cultures / Cytology: BC x 2 9/16>>> Fungal / AFB 9/17>>> Pleural Culture 9/17 > neg Pleural Cytology>>>reactive mesothelial cells, negative for malignancy  Antibiotics: Cefepime 9/16 (?CAP)> 9/20 levaquin 9/16>>> ([place] 01/04/12)_ Anti-infectives     Start     Dose/Rate Route Frequency Ordered Stop   12/28/11 0000   levofloxacin (LEVAQUIN) 750 MG tablet        750 mg Oral Daily at bedtime 12/28/11 0854 01/04/12 2359   12/27/11 2200   levofloxacin (LEVAQUIN) tablet 750 mg        750 mg Oral Daily at bedtime 12/27/11 1328 01/01/12 2159   12/21/11 2200   levofloxacin (LEVAQUIN) tablet 750 mg  Status:  Discontinued        750 mg Oral Daily at bedtime 12/21/11 1431 12/27/11 1328   12/19/11 0800   vancomycin (VANCOCIN) IVPB 1000 mg/200 mL premix  Status:  Discontinued        1,000 mg 200 mL/hr over 60 Minutes Intravenous Every 8 hours 12/19/11 0106 12/21/11 1342   12/19/11 0115   vancomycin (VANCOCIN) IVPB 1000 mg/200 mL premix        1,000 mg 200 mL/hr over 60 Minutes Intravenous NOW 12/19/11 0106 12/19/11 0218   12/19/11 0045   ceFEPIme (MAXIPIME) 1 g in dextrose 5 % 50 mL IVPB  Status:  Discontinued        1 g 100 mL/hr over 30 Minutes Intravenous 3 times per day 12/19/11 0035 12/22/11 1705   12/19/11 0045   levofloxacin (LEVAQUIN) IVPB 750 mg        750 mg 100 mL/hr over 90 Minutes Intravenous Every 24 hours 12/19/11 0035  12/20/11 2149   12/18/11 2100  piperacillin-tazobactam (ZOSYN) IVPB 4.5 g       4.5 g 200 mL/hr over 30 Minutes Intravenous  Once 12/18/11 2042 12/18/11 2319   12/18/11 2045   levofloxacin (LEVAQUIN) IVPB 750 mg  Status:  Discontinued        750 mg 100 mL/hr over 90 Minutes Intravenous Every 24 hours 12/18/11 2042 12/19/11 0106           Tests / Events: CT Abd lung cut July 2013 - no effusion CT chest 11/02/11 -small left pleural effusion CT abdomen 12/05/11 - increassing left pleural effusion CT chest 9/16>>Large left pleural effusion, compressive ATX, small pericardial effusion, coronary calcification, cardiomegaly Lt thoracentesis 9/17>>1500 ml fluid, protein 5.4, LDH 161, glucose 137, 537 WBC (60N, 13L, 86M, E3) L pigtail 9/19 >> 9/25    SUBJECTIVE/OVERNIGHT/INTERVAL HX No sob, pain better . Feels stronger . CXR does not show recurrence of effusion. Going home imminently.  Has opd fu with NP. Says he does not want albuterol as needed  Vital Signs: BP 96/58  Pulse 88  Temp 98.3 F (36.8 C) (Oral)  Resp 16  Ht 5\' 9"  (1.753 m)  Wt 83.825 kg (184  lb 12.8 oz)  BMI 27.29 kg/m2  SpO2 99% Room air   Intake/Output Summary (Last 24 hours) at 12/28/11 1645 Last data filed at 12/28/11 1100  Gross per 24 hour  Intake    480 ml  Output   1120 ml  Net   -640 ml      Intake/Output      09/25 0701 - 09/26 0700 09/26 0701 - 09/27 0700   P.O. 480 240   I.V. (mL/kg)     Total Intake(mL/kg) 480 (5.7) 240 (2.9)   Urine (mL/kg/hr) 1335 (0.7) 600 (0.7)   Total Output 1335 600   Net -855 -360         Dg Chest 2 View  12/28/2011  *RADIOLOGY REPORT*  Clinical Data: Follow up chest tube removal  CHEST - 2 VIEW  Comparison: 12/27/2011  Findings:  There is a right chest wall porta-catheter with tip in the cavoatrial junction.  Interval removal of left chest tube.  There is persistent blunting of the left costophrenic angle consistent with effusion and/or pleural parenchymal scarring.   No airspace consolidation or interstitial edema.  IMPRESSION:  1.  No pneumothorax after chest tube removal.   Original Report Authenticated By: Rosealee Albee, M.D.    Dg Chest 2 View  12/27/2011  *RADIOLOGY REPORT*  Clinical Data: Pleural drain.  Chest tube.  Effusion.  CHEST - 2 VIEW  Comparison: 12/26/2011  Findings: Right Port-A-Cath and left chest tube remain in place, unchanged.  Scarring in the left lung base, stable.  Possible small residual left effusion, stable.  Right lung is clear.  Heart is borderline in size. No pneumothorax.  IMPRESSION: No interval change.   Original Report Authenticated By: Cyndie Chime, M.D.      Physical Examination: General:  Chronically ill appearing white male.   Neuro:  Alert and oriented w/out focal def HEENT:  [place], no VJD Cardiovascular:  rrr Lungs:  Clear bilaterally, CT w/ no airleak and minimal output  Abdomen:  Non-tender Musculoskeletal:  intact Skin: intact    Labs and Imaging:   Lab 12/26/11 0519 12/24/11 0545 12/23/11 0624  NA 138 133* 133*  K 4.0 4.0 4.1  CL 98 95* 98  CO2 29 29 26   BUN 7 5* 5*  CREATININE 0.59 0.54 0.46*  GLUCOSE 128* 126* 145*    Lab 12/26/11 0519 12/24/11 0545 12/23/11 0624  HGB 11.6* 11.7* 11.8*  HCT 36.7* 37.6* 36.6*  WBC 10.4 10.4 11.9*  PLT 439* 493* 439*    Lab 12/26/11 0519 12/24/11 0545 12/23/11 0624 12/22/11 0547  PROCALCITON -- -- -- --  WBC 10.4 10.4 11.9* 9.8  LATICACIDVEN -- -- -- --        PCXR: improved aeration. Decreased effusion. Left CT in place.   Assessment and Plan:  Large Left pleural effusion. Probable L parapneumonic effusion likely d/t PNA vs malignant (cytology from pleural fluid is an insensitive test)  . S/p Pigtail cath placement on 9/19 with dc 9/25 drained lot of fluid  - on 9/26: No recurrent  effusion on CXR   Plan: - complete total 2 weeks monotherapy w/ levaquin to end 01/04/12 --have set him up w/ f/u with [place] on 01/11/12 with Tammy Parrett. Needs CXR  at time of eval with NP  - After that with DR Marchelle Gearing -NOTE: Does not need albuterol prn  6 mm Lt upper lobe pulmonary nodule.  Incidental finding on CT chest 11/02/11. Plan: -Will need outpt follow up - CT  in 6 months May 04, 2012  Metastatic colon cancer. Plan: -Pt requesting referral to new oncologist>>defer decision to primary team.  AT OPD fu with pulmonary need to ensure he has one  CAD.  Chronic systolic CHF Hyperlipidemia.   Plan: -per primary team -[place] [facility] following      Dr. Kalman Shan, M.D., [facility].C.P Pulmonary and Critical Care Medicine Staff Physician [facility]  Pulmonary and Critical Care Pager: [phone], If no answer or between  15:00h - 7:00h: call [phone]  12/28/2011 4:48 PM

## 2011-12-28 NOTE — Progress Notes (Signed)
Physical Therapy Treatment Patient Details Name: Adam Henderson MRN: [id number] DOB: 03-Jan-1956 Today's Date: 12/28/2011 Time: 1205-1228 PT Time Calculation (min): 23 min  PT Assessment / Plan / Recommendation Comments on Treatment Session  Pt. progressing well with ambulation though believe he will be a safer ambulator with use of RW at least short term.    Follow Up Recommendations  Home health PT;Supervision - Intermittent    Barriers to Discharge        Equipment Recommendations  None recommended by PT    Recommendations for Other Services    Frequency Min 3X/week   Plan Discharge plan remains appropriate    Precautions / Restrictions Precautions Precautions: Fall Restrictions Weight Bearing Restrictions: No   Pertinent Vitals/Pain No pain, no distress    Mobility  Bed Mobility Bed Mobility: Not assessed Transfers Transfers: Sit to Stand;Stand to Sit Sit to Stand: 6: Modified independent (Device/Increase time);With upper extremity assist;From chair/3-in-1 Stand to Sit: 6: Modified independent (Device/Increase time);With upper extremity assist;With armrests;To chair/3-in-1 Details for Transfer Assistance: smooth transition Ambulation/Gait Ambulation/Gait Assistance: 4: Min guard Ambulation Distance (Feet): 150 Feet Assistive device: None Ambulation/Gait Assistance Details: slight unsteadiness without device Gait Pattern: Step-through pattern;Decreased stride length;Decreased step length - right;Decreased dorsiflexion - right Stairs: No Wheelchair Mobility Wheelchair Mobility: No    Exercises     PT Diagnosis:    PT Problem List:   PT Treatment Interventions:     PT Goals Acute Rehab PT Goals PT Goal: Ambulate - Progress: Goal set today  Visit Information  Last PT Received On: 12/28/11 Assistance Needed: +1    Subjective Data  Subjective: "I might be going home today"   Cognition  Overall Cognitive Status: Appears within functional limits for tasks  assessed/performed Arousal/Alertness: Awake/alert Orientation Level: Appears intact for tasks assessed Behavior During Session: [facility] for tasks performed    Balance     End of Session PT - End of Session Equipment Utilized During Treatment: Gait belt Activity Tolerance: Patient tolerated treatment well Patient left: in chair;with call bell/phone within reach Nurse Communication: Mobility status   GP     Ferman Hamming 12/28/2011, 1:21 PM Weldon Picking PT Acute Rehab Services [phone] [place] [phone]

## 2011-12-28 NOTE — Discharge Summary (Addendum)
Physician Discharge Summary  Adam Henderson MRN: [id number] DOB/AGE: Mar 29, 1956 56 y.o.  PCP: [name], MD   Admit date: 12/18/2011 Discharge date: 12/28/2011  Discharge Diagnoses:  s/p left chest drain 9/19 for parapneumonic effusion 6 mm Lt upper lobe pulmonary nodule Metastatic colon cancer Chronic systolic CHF   *Pleural effusion Active Problems:  ADENOCARCINOMA  SIRS (systemic inflammatory response syndrome)  Hypotension     Medication List     As of 12/28/2011  8:56 AM    STOP taking these medications         amoxicillin-clavulanate 875-125 MG per tablet   Commonly known as: AUGMENTIN      TAKE these medications         albuterol (5 MG/ML) 0.5% nebulizer solution   Commonly known as: PROVENTIL   Take 0.5 mLs (2.5 mg total) by nebulization every 2 (two) hours as needed for wheezing.      albuterol 108 (90 BASE) MCG/ACT inhaler   Commonly known as: PROVENTIL HFA;VENTOLIN HFA   Inhale 2 puffs into the lungs every 6 (six) hours as needed for wheezing.      aspirin EC 81 MG tablet   Take 81 mg by mouth daily.      benzonatate 100 MG capsule   Commonly known as: TESSALON   Take 100 mg by mouth 3 (three) times daily as needed. For cough.      guaiFENesin-dextromethorphan 100-10 MG/5ML syrup   Commonly known as: ROBITUSSIN DM   Take 5 mLs by mouth every 6 (six) hours as needed for cough.      HYDROcodone-acetaminophen 5-325 MG per tablet   Commonly known as: NORCO/VICODIN   Take 1 tablet by mouth every 4 (four) hours as needed. For pain.      IRON PO   Take 1 tablet by mouth daily.      levofloxacin 750 MG tablet   Commonly known as: LEVAQUIN   Take 1 tablet (750 mg total) by mouth at bedtime.      pantoprazole 40 MG tablet   Commonly known as: PROTONIX   Take 40 mg by mouth daily.      pravastatin 20 MG tablet   Commonly known as: PRAVACHOL   Take 20 mg by mouth at bedtime.      prochlorperazine 10 MG tablet   Commonly known as:  COMPAZINE   Take 10 mg by mouth every 6 (six) hours as needed. nausea      Tamsulosin HCl 0.4 MG Caps   Commonly known as: FLOMAX   Take 0.4 mg by mouth daily.      vitamin B-12 1000 MCG tablet   Commonly known as: CYANOCOBALAMIN   Take 1,000 mcg by mouth daily.      vitamin C 500 MG tablet   Commonly known as: ASCORBIC ACID   Take 500 mg by mouth daily.        Discharge Condition: Stable   Disposition: 01-Home or Self Care   Consults: * #1 interventional radiology #2 pulmonary and critical care #3  Significant Diagnostic Studies: Dg Chest 2 View  12/28/2011  *RADIOLOGY REPORT*  Clinical Data: Follow up chest tube removal  CHEST - 2 VIEW  Comparison: 12/27/2011  Findings:  There is a right chest wall porta-catheter with tip in the cavoatrial junction.  Interval removal of left chest tube.  There is persistent blunting of the left costophrenic angle consistent with effusion and/or pleural parenchymal scarring.  No airspace consolidation or interstitial edema.  IMPRESSION:  1.  No pneumothorax after chest tube removal.   Original Report Authenticated By: Rosealee Albee, M.D.    Dg Chest 2 View  12/27/2011  *RADIOLOGY REPORT*  Clinical Data: Pleural drain.  Chest tube.  Effusion.  CHEST - 2 VIEW  Comparison: 12/26/2011  Findings: Right Port-A-Cath and left chest tube remain in place, unchanged.  Scarring in the left lung base, stable.  Possible small residual left effusion, stable.  Right lung is clear.  Heart is borderline in size. No pneumothorax.  IMPRESSION: No interval change.   Original Report Authenticated By: Cyndie Chime, M.D.    Dg Chest 2 View  12/26/2011  *RADIOLOGY REPORT*  Clinical Data: Pleural effusion and chest tube  CHEST - 2 VIEW  Comparison: Chest radiograph 12/23/2011,12/22/2011, 12/21/2011.  Findings: Right IJ power Port-A-Cath is in satisfactory position, with the tip in the distal superior vena cava.  A single left-sided chest tube terminates posteriorly  in the left mid hemithorax.  The left pleural effusion has significantly decreased in size since the two-view chest radiograph of 12/21/2011, and appears smaller compared to the most recent study of 12/23/2011.  Aeration of the left lung base is improved, suggesting some re-expansion of the atelectatic lung and/or decrease in airspace disease.  The right lung is well expanded and clear.  There is no pleural effusion on the right.  Stable mild cardiomegaly.  Pulmonary vascularity normal.  No acute bony abnormality.  IMPRESSION: 1. Near complete resolution of left pleural effusion, with left chest tube in place.  Improved aeration of the left lung base. 2. Stable mild cardiomegaly.   Original Report Authenticated By: Britta Mccreedy, M.D.    Dg Chest 2 View  12/21/2011  *RADIOLOGY REPORT*  Clinical Data: Follow up left pleural effusion  CHEST - 2 VIEW  Comparison: 12/19/2011  Findings: Right chest wall porta-catheter is identified with tip in the cavoatrial junction.  There is a large left pleural effusion which is not significantly changed from previous exam.  Right lung is clear.  IMPRESSION:  1.  No change in volume of left pleural effusion.   Original Report Authenticated By: Rosealee Albee, M.D.    Dg Chest 2 View  12/18/2011  *RADIOLOGY REPORT*  Clinical Data: Short of breath and pneumonia  CHEST - 2 VIEW  Comparison: 11/29/2011  Findings: Large left effusion has progressed from the  prior study. There is collapse of the left lower lobe.  Right lung is clear. Port-A-Cath tip remains in the lower SVC.  No heart failure.  IMPRESSION: Large left effusion has progressed from the prior study.  There is collapse of the left lower lobe.  Thoracentesis is suggested to rule out underlying malignancy or infection.   Original Report Authenticated By: Camelia Phenes, M.D.    Dg Chest 2 View  11/29/2011  *RADIOLOGY REPORT*  Clinical Data: Cough, shortness of breath and chest pain.  CHEST - 2 VIEW  Comparison: CT  chest 11/02/2011 and chest radiograph 11/01/2011.  Findings: Trachea is midline.  Heart size stable.  Right IJ power port tip projects over the SVC.  Small left pleural effusion and left basilar airspace disease are new.  IMPRESSION: Small left pleural effusion with left basilar airspace disease.   Original Report Authenticated By: Reyes Ivan, M.D.    Ct Angio Chest Pe W/cm &/or Wo Cm  12/18/2011  *RADIOLOGY REPORT*  Clinical Data: Chest pain and shortness of breath.  History of colon cancer.  CT ANGIOGRAPHY CHEST  Technique:  Multidetector CT imaging of the chest using the standard protocol during bolus administration of intravenous contrast. Multiplanar reconstructed images including MIPs were obtained and reviewed to evaluate the vascular anatomy.  Contrast: OMNIPAQUE IOHEXOL 350 MG/ML SOLN  Comparison: PA and lateral chest earlier this same day and CT chest 11/02/2011.  Findings: No pulmonary embolus is identified.  Since the prior CT scan, the small left pleural effusion seen on the prior study has increased in size and now nearly fills the left chest.  Trace amount of pleural fluid seen on the prior study has resolved. There is a small pericardial effusion, increased in size since the prior study.  Cardiomegaly is noted.  There are some small mediastinal lymph nodes but no pathologic lymphadenopathy by CT size criteria in the axilla, hila or mediastinum is identified. Coronary atherosclerotic vascular disease is noted.  The lungs demonstrate mark compressive atelectasis of the left lung.  The right lung is clear.  Incidentally imaged upper abdomen is unremarkable.  No focal bony abnormality is identified.  IMPRESSION:  1.  Negative for pulmonary embolus. 2.  Marked increase in a left pleural effusion which now nearly fills the left chest.  Associated compressive atelectasis of the left lung is seen. 3.  New very small pericardial effusion. 4.  Calcific coronary artery disease.  Cardiomegaly  also noted.   Original Report Authenticated By: Bernadene Bell. Maricela Curet, M.D.    Ct Guided Abscess Drain  12/21/2011  *RADIOLOGY REPORT*  Clinical Data/Indication: LEFT PLEURAL EFFUSION  CT GUIDED ABCESS DRAINAGE WITH CATHETER  Sedation: Versed 2.0 mg, Fentanyl 100 mg.  Total Moderate Sedation Time: 21 minutes.  Procedure: The procedure, risks, benefits, and alternatives were explained to the patient. Questions regarding the procedure were encouraged and answered. The patient understands and consents to the procedure.  The left anterolateral chest was prepped with betadine in a sterile fashion, and a sterile drape was applied covering the operative field. A sterile gown and sterile gloves were used for the procedure.  Under CT guidance, an 19 gauge needle was inserted into the left pleural space via left anterior axillary line approach.  It was removed over an 0038 wire. Successive 12 and 18-French dilators were inserted.  A 16-French Thal-Quik drain was then advanced over the wire coiled in the pleural fluid.  It was then attached to a Pleur-Evac at 20 cm water suction.  Findings: Imaging demonstrates placement of a left 16-French thoracostomy tube.  Complications: None.  IMPRESSION: Successful left  16-French thoracostomy tube placement.   Original Report Authenticated By: Donavan Burnet, M.D.    Ct Abdomen Pelvis W Contrast  12/05/2011  *RADIOLOGY REPORT*  Clinical Data: Rectal carcinoma.  Lower abdominal pain.  Pelvic fluid collection.  CT ABDOMEN AND PELVIS WITH CONTRAST  Technique:  Multidetector CT imaging of the abdomen and pelvis was performed following the standard protocol during bolus administration of intravenous contrast.  Contrast: 80mL OMNIPAQUE IOHEXOL 300 MG/ML  SOLN  Comparison: 10/09/2011  Findings: Images through the lung bases show a moderate left-sided pleural effusion and left lower lobe atelectasis.  Hepatic cirrhosis is again noted, however no liver masses are identified.  The other  abdominal parenchymal organs are normal in appearance.  Gallbladder is unremarkable.  No evidence of hydronephrosis.  A large partially necrotic soft tissue mass is again seen involving the rectum, with extension to involve the left pelvic sidewall and base of the urinary bladder.  This measures approximately 8.6 x 10.4 cm maximum dimensions, without  significant change in size since prior study.  Asymmetric diffuse wall thickening of the urinary bladder is also stable in appearance, which may be due to tumor, muscular hypertrophy, or cystitis.  Shotty less than 1 cm lymph nodes are again seen in the central sigmoid mesentery and right lower quadrant small bowel mesentery which are stable.  Shotty less than 1 cm retroperitoneal lymph nodes in the aortocaval space are also stable.  No new or worsening areas of lymphadenopathy identified.  No evidence of inflammatory process or abnormal fluid collections. No evidence of bowel obstruction.  A left lower quadrant colostomy is again demonstrated.  No suspicious bone lesions identified.  IMPRESSION:  1.  No significant change in size of large, partially necrotic rectal mass, with involvement of the left pelvic side wall and base of the urinary bladder. 2.  Stable shotty sigmoid and small bowel mesenteric lymphadenopathy, and retroperitoneal lymphadenopathy. 3.  Increased moderate left pleural effusion and left lower lobe atelectasis. 4.  Stable hepatic cirrhosis.  No evidence of hepatic neoplasm, ascites, or abscess.   Original Report Authenticated By: Danae Orleans, M.D.    Ct Aspiration  12/05/2011  *RADIOLOGY REPORT*  CT GUIDED ASPIRATION  Date: 12/05/2011  Clinical History: 56 year old male with a history of advanced rectal cancer status post APR and adjuvant CTX with a persistent fluid and gas collection in the surgical bed concerning for recurrent necrotic tumor versus abscess. The CEA level has been rising.  Procedures Performed: 1. CT guided aspiration   Interventional Radiologist:  Sterling Big, MD  Sedation: Moderate (conscious) sedation was used.  1.5 mg Versed, 50 mcg Fentanyl were administered intravenously.  The patient's vital signs were monitored continuously by radiology nursing throughout the procedure.  Sedation Time: 22 minutes  Fluoroscopy time: 10 seconds  PROCEDURE/FINDINGS:   Informed consent was obtained from the patient following explanation of the procedure, risks, benefits and alternatives. The patient understands, agrees and consents for the procedure. All questions were addressed. A time out was performed.  Maximal barrier sterile technique utilized including caps, mask, sterile gowns, sterile gloves, large sterile drape, hand hygiene, and betadine skin prep.  A planning axial CT scan was performed.  An appropriate skin entry site was selected and marked.  Local anesthesia was obtained with infiltration of 1% lidocaine.  Under CT fluoroscopic guidance, an 18 gauge trocar needle was advanced to the skin and soft tissues into the fluid and gas collection in the deep anatomic pelvis.  A very small amount (8 ml) of bloody fluid containing chunks of caseous material was successfully aspirated.  This material was divided and sent to microbiology for culture, as well as pathology for pathologic analysis.  The needle was manipulated in multiple directions in an effort to aspirate additional material.  This was not successful.  Location of the needle was confirmed with axial CT imaging.  Given the imaging findings, and lack of purulent return, it is felt that this lesion represents a centrally necrotic recurrent tumor. Therefore, the needle was withdrawn.  No abscess drain was placed. The patient tolerated the procedure well, there was no immediate complication.  After appropriate observation period, the patient was discharged home.  IMPRESSION:  Technically successful CT guided aspiration of a small amount of bloody fluid and caseous material  from within the deep pelvic necrotic mass.  The material was divided and sent to microbiology for culture, and pathology for pathologic evaluation.  No percutaneous drain was placed.  These results were called by telephone  on 12/05/2011 at 03:45 p.m. to Dr. [name], who verbally acknowledged these results.  Signed,  Sterling Big, MD Vascular & Interventional Radiologist [facility] Radiology   Original Report Authenticated By: Alvino Blood Chest Port 1 View  12/23/2011  *RADIOLOGY REPORT*  Clinical Data: Evaluate left-sided chest tube  PORTABLE CHEST - 1 VIEW  Comparison: 12/22/2011; 12/21/2011; 12/19/2011; CT guided thoracostomy tube placement  Findings: Grossly unchanged large cardiac silhouette and mediastinal contours.  Stable position of support apparatus. Interval reaccumulation of a small left-sided pleural effusion and left mid and lower lung heterogeneous / consolidative opacities post left-sided thoracotomy tube placement. Increased right infrahilar heterogeneous opacities.  No pneumothorax.  Unchanged bones.  IMPRESSION: 1.  Stable positioning of support apparatus. No pneumothorax.  2.  Interval reaccumulation of small left-sided effusion with associated left mid and lower lung opacities, favored to represent atelectasis. 2.  Worsening right infrahilar opacities favored to represent atelectasis.   Original Report Authenticated By: Waynard Reeds, M.D.    Dg Chest Port 1 View  12/22/2011  *RADIOLOGY REPORT*  Clinical Data: Chest tube.  Shortness of breath.  PORTABLE CHEST - 1 VIEW  Comparison: Yesterday.  Findings: Interval small caliber left chest tube.  The previously seen left pleural fluid is no longer present.  Patchy airspace opacity in the left mid and lower lung zones.  Clear right lung. Right jugular porta-catheter, unchanged.  Enlarged cardiac silhouette.  Unremarkable bones.  IMPRESSION:  1.  Interval resolution of the left pleural fluid following chest tube placement. 2.  Patchy  airspace opacity in the left mid and lower lung zones. This could represent pneumonia, re-expansion pulmonary edema or residual atelectasis. 3.  Cardiomegaly.   Original Report Authenticated By: Darrol Angel, M.D.    Dg Chest Port 1 View  12/19/2011  *RADIOLOGY REPORT*  Clinical Data: Left chest pain following thoracentesis.  PORTABLE CHEST - 1 VIEW  Comparison: Portable film earlier in the day.  Findings: Moderate left pleural effusion persists.  Compressive atelectasis left base.  Cardiomegaly.  No pneumothorax.  Right lung shows mild vascular congestion.  Unchanged Port-A-Cath. Unremarkable osseous structures.  IMPRESSION: Moderate left effusion persists.  There is no pneumothorax or significant change from earlier in the day.   Original Report Authenticated By: Elsie Stain, M.D.    Dg Chest Port 1 View  12/19/2011  *RADIOLOGY REPORT*  Clinical Data: Left thoracentesis follow-up.  PORTABLE CHEST - 1 VIEW  Comparison: 12/18/2011 CT  Findings: Decrease in size of the left pleural effusion. Hazy appearance to the left aerated portion of the lung may correspond to superimposed layering fluid in the pleural space or airspace edema.  No pneumothorax visualized.  Cardiomegaly.  Right Port-A- Cath with tip projecting over the cavoatrial junction.  No interval osseous change.  IMPRESSION: Decreased size of the left pleural effusion status post thoracentesis.  Hazy appearance to the left lung may reflect edema or superimposed layering pleural fluid.  No pneumothorax.   Original Report Authenticated By: Waneta Martins, M.D.    X.   2-D echo Most recent echo was done in August of 2013 that showed Study Conclusions  - Left ventricle: Poor acoustic windows limit study. LVEF is apprxoimately 30 to 35% with severe hypokinesis of the inferior, septlal and posterior walls. The cavity size was severely dilated.     Microbiology: Recent Results (from the past 240 hour(s))  CULTURE, BLOOD (ROUTINE X  2)     Status: Normal   Collection  Time   12/18/11  8:54 PM      Component Value Range Status Comment   Specimen Description BLOOD ARM RIGHT   Final    Special Requests BOTTLES DRAWN AEROBIC AND ANAEROBIC 10CC   Final    Culture  Setup Time 12/19/2011 01:47   Final    Culture NO GROWTH 5 DAYS   Final    Report Status 12/25/2011 FINAL   Final   CULTURE, BLOOD (ROUTINE X 2)     Status: Normal   Collection Time   12/18/11  9:08 PM      Component Value Range Status Comment   Specimen Description BLOOD HAND LEFT   Final    Special Requests BOTTLES DRAWN AEROBIC ONLY [facility]   Final    Culture  Setup Time 12/19/2011 01:47   Final    Culture NO GROWTH 5 DAYS   Final    Report Status 12/25/2011 FINAL   Final   GRAM STAIN     Status: Normal   Collection Time   12/19/11 11:45 AM      Component Value Range Status Comment   Specimen Description PLEURAL FLUID LEFT   Final    Special Requests   Final    Gram Stain     Final    Value: RARE WBC PRESENT,BOTH PMN AND MONONUCLEAR     NO ORGANISMS SEEN   Report Status 12/19/2011 FINAL   Final   BODY FLUID CULTURE     Status: Normal   Collection Time   12/19/11 11:45 AM      Component Value Range Status Comment   Specimen Description PLEURAL FLUID LEFT   Final    Special Requests   Final    Gram Stain     Final    Value: RARE WBC PRESENT,BOTH PMN AND MONONUCLEAR     NO ORGANISMS SEEN     Performed at [facility]   Culture NO GROWTH 3 DAYS   Final    Report Status 12/22/2011 FINAL   Final   AFB CULTURE WITH SMEAR     Status: Normal (Preliminary result)   Collection Time   12/19/11 11:45 AM      Component Value Range Status Comment   Specimen Description PLEURAL FLUID LEFT   Final    Special Requests   Final    ACID FAST SMEAR NO ACID FAST BACILLI SEEN   Final    Culture     Final    Value: CULTURE WILL BE EXAMINED FOR 6 WEEKS BEFORE ISSUING A FINAL REPORT   Report Status PENDING   Incomplete   FUNGUS CULTURE W SMEAR     Status:  Normal (Preliminary result)   Collection Time   12/19/11 11:45 AM      Component Value Range Status Comment   Specimen Description PLEURAL FLUID LEFT   Final    Special Requests   Final    Fungal Smear NO YEAST OR FUNGAL ELEMENTS SEEN   Final    Culture CULTURE IN PROGRESS FOR FOUR WEEKS   Final    Report Status PENDING   Incomplete   CULTURE, EXPECTORATED SPUTUM-ASSESSMENT     Status: Normal   Collection Time   12/19/11  2:07 PM      Component Value Range Status Comment   Specimen Description SPUTUM   Final    Special Requests NONE   Final    Sputum evaluation     Final  Value: MICROSCOPIC FINDINGS SUGGEST THAT THIS SPECIMEN IS NOT REPRESENTATIVE OF LOWER RESPIRATORY SECRETIONS. PLEASE RECOLLECT.     CALLED TO RN J.SMITH AT 1533 BY L.PITT 12/19/11.   Report Status 12/19/2011 FINAL   Final      Labs: No results found for this or any previous visit (from the past 48 hour(s)).   HPI : 61 yowm admitted 12/18/2011 dyspnea, night sweats, dry cough and chest pain. Had several weeks of outpt tx for PNA. Found to have large Lt pleural effusion and PCCM consulted 9/17. Has hx of Stage IIIb rectal cancer s/p resection and s/p completion of chemo October 2012. Found to have pelvic recurrence 12/05/11. PMHx CAD, Hyperlipidemia, Systolic CHF   Tests / Events:  CT Abd lung cut July 2013 - no effusion  CT chest 11/02/11 -small left pleural effusion  CT abdomen 12/05/11 - increassing left pleural effusion   6 mm Lt upper lobe pulmonary nodule. Incidental finding on CT chest 11/02/11.     HOSPITAL COURSE:  #1 pneumonic effusion Patient presented with shortness of breath and was found to have a large left pleural effusion This was done by Dr. Craige Cotta, pulmonary and 1.5 L were removed Because of recurrent effusion a chest tube was placed by interventional radiology Chest tube was removed on 12/27/2011 The patient will need a followup chest x-ray  Pleural fluid analysis showed the  following Cultures / Cytology:  BC x 2 9/16>>>  Fungal / AFB 9/17>>>  Pleural Culture 9/17 > neg  Pleural Cytology>>>reactive mesothelial cells, negative for malignancy  Radiographic studies done during this hospitalization CT chest 9/16>>Large left pleural effusion, compressive ATX, small pericardial effusion, coronary calcification, cardiomegaly  Lt thoracentesis 9/17>>1500 ml fluid, protein 5.4, LDH 161, glucose 137, 537 WBC (60N, 13L, 55M, E3)  L pigtail 9/19 >> 9/25  Initially thought to have a parapneumonic effusion and treated with cefepime Later switched to levofloxacin, per pulmonary the patient should complete a total of 2 weeks of monotherapy with levofloxacin He has a followup appointment with, pulmonology on the 01/11/12 with Tammy Parrett and after that with DR Marchelle Gearing or Dr Craige Cotta  Nonsustained ventricular tachycardia  Per cardiology the patient has CAD (s/p MI/cath 02/15/10 with thrombectomy/stent to OM2; cath 10/2011 nonobstructive, patent OM2 stent), 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 Initial trop-I returned WNL In the context of his prognosis for his cancer, as blood pressure allows, carvedilol and ACE inhibitors which have long-term benefit for mortality and heart failure risk reduction the appropriate to consider but in the short term I think are less relevant.  Patient is not a candidate for ICD implantation. No further workup per cardiology. 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 He would like to establish with oncology in [place], [place]. Patient scheduled an appointment with Dr. Mariel Sleet on 01/01/2012 at 11:30 AM.    -Suspicion for malignant pleural effusion-if pleural effusion recurs after removal of the chest tube-will need to consult [facility] for a VATS procedure  6  mm Lt upper lobe pulmonary nodule  Incidental finding on CT chest 11/02/11. Will need outpatient followup.   Dyslipidemia  -c/w statin  SIRS (systemic inflammatory response syndrome)  -resolved  Hypotension  -resolved, BP stilL soft  Coronary artery disease  - aspirin.  Chronic Systolic Heart Failure  -compensated clinically  -Effusion-exudate, not releated to CHF  Disposition:  Remain inpatient , PT eval pending   Discharge Exam: Blood pressure 103/66, pulse 92, temperature 98 F (36.7 C), temperature source Oral, resp. rate 16, height 5\' 9"  (1.753 m), weight 83.825 kg (184 lb 12.8 oz), SpO2 100.00%.  General: Appears older than stated age, pallor appreciated, in no acute distress.  Head: Normocephalic, atraumatic, sclera non-icteric, no xanthomas, nares are without discharge.  Neck: Negative for carotid bruits. JVD not elevated.  Lungs: Decreased BS to left lower lung fields, otherwise no appreciable wheezes, rales or rhonchi. Breathing is unlabored.  Heart: RRR with S1 S2. No murmurs, rubs, or gallops appreciated.  Abdomen: Soft, non-tender, non-distended with normoactive bowel sounds. No hepatomegaly. No rebound/guarding. No obvious abdominal masses.  Msk: Strength and tone appears decreased for age.  Extremities: No clubbing, cyanosis or edema. Distal pedal pulses are 2+ and equal bilaterally.  Neuro: Alert and oriented X 3. Moves all extremities spontaneously.  Psych: Responds to questions appropriately with a normal affect.           Discharge Orders    Future Appointments:  Provider: Department: Dept Phone: Center:   01/01/2012 11:30 AM [facility] [phone] None   01/08/2012 8:45 AM Lewayne Bunting, MD [facility] [phone] [facility]   01/11/2012 11:30 AM Julio Sicks, NP [facility] [phone] None      Follow-up Information    Follow up with Randall An, MD. On 01/01/2012. (11:30 AM)    Contact information:   [address] [place] [address] [phone]       Follow up with [name], MD. Schedule an appointment as soon as possible for a visit in 1 week.   Contact information:   [address] [place] [address] [phone]       Follow up with PARRETT,TAMMY, NP. On 01/11/2012. (at 11)    Contact information:   [facility] [address] [place] [address] [phone]          Signed: Richarda Overlie 12/28/2011, 8:56 AM

## 2011-12-28 NOTE — Progress Notes (Signed)
Pharmacist Heart Failure Core Measure Documentation  Assessment: Adam Henderson has an EF documented as 30-35% on 11/02/11 by ECHO.  Rationale: Heart failure patients with left ventricular systolic dysfunction (LVSD) and an EF < 40% should be prescribed an angiotensin converting enzyme inhibitor (ACEI) or angiotensin receptor blocker (ARB) at discharge unless a contraindication is documented in the medical record.  This patient is not currently on an ACEI or ARB for HF.  This note is being placed in the record in order to provide documentation that a contraindication to the use of these agents is present for this encounter.  ACE Inhibitor or Angiotensin Receptor Blocker is contraindicated (specify all that apply)  []   ACEI allergy AND ARB allergy []   Angioedema []   Moderate or severe aortic stenosis []   Hyperkalemia [x]   Hypotension []   Renal artery stenosis []   Worsening renal function, preexisting renal disease or dysfunction   [name] 12/28/2011 9:21 AM

## 2011-12-28 NOTE — Progress Notes (Signed)
Pt not being DC on lasix or ACE inhibitors because BP runs low, pt is well compensated, and has not been receiving lasix while inpatient per Dr. Susie Cassette.

## 2012-01-01 ENCOUNTER — Encounter (HOSPITAL_COMMUNITY): Payer: BC Managed Care – PPO | Attending: Hematology and Oncology | Admitting: Hematology and Oncology

## 2012-01-01 ENCOUNTER — Encounter (HOSPITAL_COMMUNITY): Payer: Self-pay

## 2012-01-01 VITALS — BP 99/58 | HR 82 | Temp 97.2°F | Resp 18 | Ht 69.0 in | Wt 200.9 lb

## 2012-01-01 DIAGNOSIS — C50919 Malignant neoplasm of unspecified site of unspecified female breast: Secondary | ICD-10-CM

## 2012-01-01 DIAGNOSIS — K7460 Unspecified cirrhosis of liver: Secondary | ICD-10-CM | POA: Insufficient documentation

## 2012-01-01 DIAGNOSIS — I509 Heart failure, unspecified: Secondary | ICD-10-CM | POA: Insufficient documentation

## 2012-01-01 DIAGNOSIS — C20 Malignant neoplasm of rectum: Secondary | ICD-10-CM | POA: Insufficient documentation

## 2012-01-01 NOTE — Progress Notes (Signed)
CC:   Adam Henderson, M.D. [name] [name], MD, [facility] [name]. [name], M.D. [name], M.D. [name]. [name], MD  IDENTIFYING STATEMENT:  The patient is a 56 year old gentleman seen at the request of [name] with rectal cancer.  INTERVAL HISTORY:  The patient received his oncology care until recently under the guidance of [name] in [place].  He wishes to relocate his care to [facility].  The patient, who is here with his mother, reports that he was diagnosed with rectal cancer in 2011.  He had presented to his primary care physician's office with profound fatigue. He was found to have symptomatic anemia and was admitted to [facility] for further workup.  He received a colonoscopy under the guidance of [name] that had diagnosed a rectal adenocarcinoma, which was found to be locally advanced.  He was seen by Dr. Myna Hidalgo as an inpatient and then referred to see [name] in [place].  He received neoadjuvant chemotherapy with radiation therapy.  Chemotherapy was in the form of oral Xeloda.  The patient informs me that he received IV chemotherapy and it may have been FOLFOX, but he is not sure.  I do not have any report from [name] office to review at this time.  But in general, the patient is a good historian.  On 07/13/2010, he underwent an abdominoperineal resection with repair of his urethra.  Surgical pathology at that time revealed a 9 cm poorly-differentiated adenocarcinoma extending through the muscularis propria.  Surgical resection margins appeared negative for adenocarcinoma. 13 out of 15 lymph nodes sampled had evidence of disease.  There was no evidence of perineural or lymphovascular invasion at that time. Following this, the patient informs me that he received 3-4 months of FOLFOX-6.  He then Subsequent to this, he underwent surveillance with [name].  In June of this year, he began to have symptoms of pressure-like symptoms in  his low abdomen.  Of note, CEA level had increased to 27.8.  CT scan on July 8th had shown with presacral fluid and gas collection of unknown etiology.  Dr. [name] did perform a colonoscopy on 10/11/2011 that essentially showed no evidence of recurrence.  The diverting colostomy was functioning.  On 12/15/2011, he received a CT scan of the abdomen and pelvis that showed a necrotic tissue mass involving the rectum.  The liver was unremarkable except for radiographic findings of hepatic cirrhosis.  There were retroperitoneal and small bowel mesentery adenopathy.  CT angiogram of his chest on 12/18/2011 showed no evidence of pulmonary embolism.  There was a mild pleural effusion.  On 12/18/2011, the patient was admitted with dyspnea, night sweats, dry cough, and chest vein.  He was diagnosed with pneumonia and found to have a large left pleural effusion.  He was placed on broad spectrum antibiotics.  Of note, the chest did show an incidental finding of a left lobe pulmonary nodule.  He received a biopsy of the pelvic soft tissue life, which was consistent with metastatic adenocarcinoma, felt to the colorectal in origin.  He has since been discharged home.  He feels well.  He is eating.  He denies pain.  Colostomy is functioning without difficulty.  He is not short of breath.  He told me that recent cardiac cath was unremarkable.  CEA on 12/05/2011 was 14.2.  PAST MEDICAL HISTORY: 1. History of locally advanced rectal cancer initially diagnosed in     2011. 2. Nonischemic cardiomyopathy with an ejection fraction  of 30% to 35%. 3. CHF. 4. History of celiac sprue.  ALLERGIES:  None.  MEDICATIONS:  Vicodin 5/325 one q.4 as needed for pain, iron daily, Flomax 0.4 mg daily, aspirin 81 mg daily, Levaquin 750 mg q.h.s., Protonix 40 mg daily, Pravachol 20 mg daily, Compazine as needed, B12 1000 mcg daily, ascorbic acid 500 mg daily.  SOCIAL HISTORY:  The patient is single.  He is here with  his mother. Denies alcohol or tobacco use.  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  FAMILY HISTORY:  The patient's mother has breast cancer.  Denies family history for colon cancer or hematological malignancies.  HEALTH MAINTENANCE:  His health care physician is [name].  REVIEW OF SYSTEMS:  He denies fever, chills, night sweats, anorexia, weight loss.  GI:  Denies nausea, vomiting, abdominal pain, diarrhea, melena, hematochezia.  GU:  Denies dysuria, hematuria, nocturia, frequency. Skin:  No bruising or bleeding.  Neurologic:  Denies headache, vision change, extremity weakness.  Rest of review of systems negative.  PHYSICAL EXAMINATION:  General:  The patient is a well-appearing, well- nourished man in no distress.  Vitals:  Pulse 82, blood pressure 99/58, temperature 97.2, respirations 18, weight is 200.5 pounds.  HEENT:  Head is atraumatic, normocephalic.  Sclerae are anicteric.  Mouth moist. Neck:  Supple.  Chest:  Clear to percussion and auscultation.  Port-A- Cath evident with no discharge or erythema.  CVS:  First and second heart sounds present.  No added sounds or murmurs.  Abdomen:  Soft, nontender.  No masses.  Bowel sounds present.  Colostomy noted with semiformed stool.  No surrounding erythema.  Bowel sounds present. Extremities:  No calf tenderness or edema.  Pulses are present and symmetrical.  Lymph Nodes:  No palpable cervical, axillary, or inguinal adenopathy.  CNS:  Nonfocal.  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  He is status post neoadjuvant chemotherapy with radiation for locally advanced adenocarcinoma of the rectum followed by an abdominoperineal resection on 07/13/2010 for stage IIIB with 13/15 positive nodes.  He then went on to receive 7 months of FOLFOX 6.  The patient has an underlying history of nonischemic  cardiomyopathy with ejection fraction of 30%-35%.  Of note, a recent CT had indicated hepatic cirrhosis.  The patient's history was negative for alcohol use or viral hepatitis.  However, with this said we will obtain hepatitis panel which he has agreed to.  He needs additional systemic chemotherapy.  Recommend treating him with Avastin with FOLFIRI.  Will have pathology assess for KRAS mutation as he may be eligible for the EGFR inhibitors.  He is very well versed with the logistics of therapy. However, this is a new center and he is getting new therapy.  He will meet with the chemotherapy nurse.  He did have a pleural tube inserted on 09/18.  So we will delay Avastin and begin with the second cycle on 10/22 with chemotherapy beginning on 01/09/2012.  He will complete staging with a PET scan.  Will request notes from his previous oncologist. The patient is here with his mother and had a number of questions which were all answered to satisfaction.  Therefore, he has tentatively been scheduled to begin therapy on 01/09/2012 here in [place].    ______________________________ Laurice Record, M.D. LIO/MEDQ  D:  01/01/2012  T:  01/01/2012  Job:  [id number]

## 2012-01-01 NOTE — Patient Instructions (Addendum)
Di Kindle  [id number]   [facility] - AFTER VISIT SUMMARY   RECOMMENDATIONS MADE BY THE CONSULTANT AND ANY TEST    RESULTS WILL BE SENT TO YOUR REFERRING DOCTORS.   YOUR EXAM FINDINGS, LABS AND RESULTS WERE DISCUSSED BY YOUR MD TODAY.  YOU CAN GO TO THE [facility] WEB SITE FOR INSTRUCTIONS ON HOW TO ASSESS MY CHART FOR ADDITIONAL INFORMATION AS NEEDED.  We will be contacting you to set up chemo teaching. We will also be setting you up for a pet scan. Chemo will be on October 8th.  Your Updated drug allergies are: Allergies as of 01/01/2012  . (No Known Allergies)    Your current list of medications are: Current Outpatient Prescriptions  Medication Sig Dispense Refill  . albuterol (PROVENTIL HFA;VENTOLIN HFA) 108 (90 BASE) MCG/ACT inhaler Inhale 2 puffs into the lungs every 6 (six) hours as needed for wheezing.  1 Inhaler  2  . albuterol (PROVENTIL) (5 MG/ML) 0.5% nebulizer solution Take 0.5 mLs (2.5 mg total) by nebulization every 2 (two) hours as needed for wheezing.  20 mL  10  . aspirin EC 81 MG tablet Take 81 mg by mouth daily.      . benzonatate (TESSALON) 100 MG capsule Take 100 mg by mouth 3 (three) times daily as needed. For cough.      Marland Kitchen guaiFENesin-dextromethorphan (ROBITUSSIN DM) 100-10 MG/5ML syrup Take 5 mLs by mouth every 6 (six) hours as needed for cough.  480 mL  0  . HYDROcodone-acetaminophen (NORCO/VICODIN) 5-325 MG per tablet Take 1 tablet by mouth every 4 (four) hours as needed. For pain.  30 tablet  0  . IRON PO Take 1 tablet by mouth daily.       Marland Kitchen levofloxacin (LEVAQUIN) 750 MG tablet Take 1 tablet (750 mg total) by mouth at bedtime.  8 tablet  0  . pantoprazole (PROTONIX) 40 MG tablet Take 40 mg by mouth daily.        . pravastatin (PRAVACHOL) 20 MG tablet Take 20 mg by mouth at bedtime.        . prochlorperazine (COMPAZINE) 10 MG tablet Take 10 mg by mouth every 6 (six) hours as needed. nausea      . Tamsulosin HCl (FLOMAX) 0.4 MG CAPS  Take 0.4 mg by mouth daily.       . vitamin B-12 (CYANOCOBALAMIN) 1000 MCG tablet Take 1,000 mcg by mouth daily.       . vitamin C (ASCORBIC ACID) 500 MG tablet Take 500 mg by mouth daily.         INSTRUCTIONS GIVEN AND DISCUSSED:  See attached schedule   SPECIAL INSTRUCTIONS/FOLLOW-UP:  See above.  I acknowledge that I have been informed and understand all the instructions given to me and received a copy.I know to contact the clinic, my physician, or go to the emergency Department if any problems should occur.   I do not have any more questions at this time, but understand that I may call the [facility] at [phone] during business hours should I have any further questions or need assistance in obtaining follow-up care.

## 2012-01-01 NOTE — Progress Notes (Signed)
This office note has been dictated.

## 2012-01-02 ENCOUNTER — Telehealth (HOSPITAL_COMMUNITY): Payer: Self-pay | Admitting: Oncology

## 2012-01-02 LAB — HEPATITIS PANEL, ACUTE
HCV Ab: NEGATIVE
Hep A IgM: NEGATIVE
Hep B C IgM: NEGATIVE

## 2012-01-02 NOTE — Telephone Encounter (Signed)
[phone] ? If cpt codes [id number]*0640*3370 require auth Per Marylene Land all codes are covered at 100% of the allowable And if billed as a spec visit will have a $50 copay. Berkley Harvey is not required Ref# [id number]  April Manson [facility] [phone]

## 2012-01-04 ENCOUNTER — Telehealth: Payer: Self-pay

## 2012-01-08 ENCOUNTER — Other Ambulatory Visit (HOSPITAL_COMMUNITY): Payer: BC Managed Care – PPO

## 2012-01-08 ENCOUNTER — Encounter: Payer: Self-pay | Admitting: Cardiology

## 2012-01-08 ENCOUNTER — Ambulatory Visit (INDEPENDENT_AMBULATORY_CARE_PROVIDER_SITE_OTHER): Payer: BC Managed Care – PPO | Admitting: Cardiology

## 2012-01-08 VITALS — BP 90/50 | HR 60 | Ht 70.0 in | Wt 203.0 lb

## 2012-01-08 DIAGNOSIS — I5022 Chronic systolic (congestive) heart failure: Secondary | ICD-10-CM

## 2012-01-08 DIAGNOSIS — I2510 Atherosclerotic heart disease of native coronary artery without angina pectoris: Secondary | ICD-10-CM

## 2012-01-08 DIAGNOSIS — I428 Other cardiomyopathies: Secondary | ICD-10-CM

## 2012-01-08 DIAGNOSIS — I509 Heart failure, unspecified: Secondary | ICD-10-CM

## 2012-01-08 NOTE — Assessment & Plan Note (Signed)
euvolemic on examination. Continue off diuretic for now.

## 2012-01-08 NOTE — Assessment & Plan Note (Signed)
Patient euvolemic on examination. His blood pressure 1 not allow a beta blocker or ACE inhibitor.

## 2012-01-08 NOTE — Patient Instructions (Addendum)
Your physician recommends that you schedule a follow-up appointment in: 3 MONTHS WITH DR CRENSHAW  

## 2012-01-08 NOTE — Patient Instructions (Addendum)
[facility] Adam Henderson  DOB 04-27-1955 CSN [id number]  MRN [id number] Dr. Glenford Peers    CHEMOTHERAPY INSTRUCTIONS  Irinotecan is a chemotherapy drug primarily used to treat colon and rectal cancer. This drug like other chemo can cause lowering of blood counts, increased risk of infection, bleeding or bruising more easily, fatigue, hair loss, mouth sores, stomach cramps, diarrhea, and nausea with vomiting. The main symptom that we look for with this drug is stomach cramps and diarrhea. If you develop diarrhea we want you to take 2 Imodium after the first loose stool then take 1 Imodium every 2 hrs until you go 12 hrs without diarrhea. It is very important that you drink 6-8 glasses of fluids daily while going thru chemo especially if you are having diarrhea. We do not want you to get dehydrated. If the Imodium does not stop diarrhea within 12 hrs please call us at [phone]. If this occurs on the weekend report to the ED.   Leucovorin - this is a medication that is not chemo but given with chemo. This med "rescues" the healthy cells before we administer the drug 5FU. This makes the 5FU work better.    5FU: bone marrow suppression (low white blood cells - wbcs fight infection, low red blood cells - rbcs make up your blood, low platelets - this is what makes your blood clot, nausea/vomiting, diarrhea, mouth sores, hair loss, dry skin, ocular toxicities (increased tear production, sensitivity to light). You must wear sunscreen/sunglasses. Cover your skin when out in sunlight. You will get burned very easily.    Avastin - this is an anti-angiogenic agent, which in other words means that it inhibits or stops the blood vessel formation, which starves the tumor. This is not "chemotherapy" but used in conjunction with chemotherapy. This is given because you have metastatic rectal cancer.   POTENTIAL SIDE EFFECTS OF TREATMENT: Increased Susceptibility to Infection,  Vomiting, Constipation, Hair Thinning, Changes in Character of Skin and Nails (brittleness, dryness,etc.), Bone Marrow Suppression, Abdominal Cramping, Urinary Frequency, Blood in Urine, Complete Hair Loss, Nausea, Diarrhea, Sun Sensitivity and Mouth Sores   SELF IMAGE NEEDS AND REFERRALS MADE: Obtain hair accessories as soon as possible (caps,etc.)   EDUCATIONAL MATERIALS GIVEN AND REVIEWED: Chemotherapy and You Specific Instruction Sheets on Irinotecan, 5FU, Leucovorin, Avastin, Dexamethasone, Zofran, EMLA cream  SELF CARE ACTIVITIES WHILE ON CHEMOTHERAPY: Increase your fluid intake 48 hours prior to treatment and drink at least 2 quarts per day after treatment., No alcohol intake., No aspirin or other medications unless approved by your oncologist., Eat foods that are light and easy to digest., Eat foods at cold or room temperature., No fried, fatty, or spicy foods immediately before or after treatment., Have teeth cleaned professionally before starting treatment. Keep dentures and partial plates clean., Use soft toothbrush and do not use mouthwashes that contain alcohol. Biotene is a good mouthwash that is available at most pharmacies or may be ordered by calling [phone]., Use warm salt water gargles (1 teaspoon salt per 1 quart warm water) before and after meals and at bedtime. Or you may rinse with 2 tablespoons of three -percent hydrogen peroxide mixed in eight ounces of water., Always use sunscreen with SPF (Sun Protection Factor) of 30 or higher., Use your nausea medication as directed to prevent nausea., Use your stool softener or laxative as directed to prevent constipation. and Use your anti-diarrheal medication as directed to stop diarrhea.  Please wash your hands for at  least 30 seconds using warm soapy water. Handwashing is the #1 way to prevent the spread of germs. Stay away from sick people or people who are getting over a cold. If you develop respiratory systems such as  green/yellow mucus production or productive cough or persistent cough let us know and we will see if you need an antibiotic. It is a good idea to keep a pair of gloves on when going into grocery stores/[employer] to decrease your risk of coming into contact with germs on the carts, etc. Carry alcohol hand gel with you at all times and use it frequently if out in public. All foods need to be cooked thoroughly. No raw foods. No medium or undercooked meats, eggs. If your food is cooked medium well, it does not need to be hot pink or saturated with bloody liquid at all. Vegetables and fruits need to be washed/rinsed under the faucet with a dish detergent before being consumed. You can eat raw fruits and vegetables unless we tell you otherwise but it would be best if you cooked them or bought frozen. Do not eat off of salad bars or hot bars unless you really trust the cleanliness of the restaurant. If you need dental work, please let Dr. Mariel Sleet know before you go for your appointment so that we can coordinate the best possible time for you in regards to your chemo regimen. You need to also let your dentist know that you are actively taking chemo. We may need to do labs prior to your dental appointment. We also want your bowels moving at least every other day. If this is not happening, we need to know so that we can get you on a bowel regimen to help you go.     MEDICATIONS: You have been given prescriptions for the following medications:  Dexamethasone (decadron) 4mg  tablet. Starting the day after chemo, take 2 tablets in the am and 2 tablets in the pm for 2 days.  This tablet will reduce the risk of having nausea/vomiting, will lessen stomach cramps, and will probably give you a little bit of energy. You could also experience irritability, trouble sleeping, or a keyed up/wired feeling while taking this medication. You will also be receiving this drug intravenously before chemo. You may notice that you have a  reddened appearance in the face, neck, and shoulders. This is a side effect of the medication and should go away in a couple of days.  Ondansetron (zofran) 8mg  tablet. Starting the day after chemo, take 1 tablet in the am and 1 tablet in the pm for 2 days. Then may take 1 tablet two times a day IF needed for nausea/vomiting.   Compazine 10mg  tablet. May take 1 tablet every 6 hours IF needed for nausea/vomiting.  EMLA cream. Apply a quarter sized amount to port site 1 hour prior to chemo. Do not rub in. Cover with plastic.   OVER-the-COUNTER:   Imodium - this is for diarrhea. Take 2 tabs after 1st loose stool and then 1 tab every 2 hours until you go a total of 12 hours without a loose stool. Call Cancer Center if loose stools continue.  Colace - this is a stool softener. Take 100mg  capsule 2-6 times a day as needed. If you have to take more than 6 capsules of Colace a day call the Cancer Center.  Senna - this is a mild laxative used to treat mild constipation. May take 2 tabs by mouth daily or up to twice a  day as needed for mild constipation.  Milk of Magnesia - this is a laxative used to treat moderate to severe constipation. May take 2-4 tablespoons every 8 hours as needed. May increase to 8 tablespoons x 1 dose and if no bowel movement call the Cancer Center.   SYMPTOMS TO REPORT AS SOON AS POSSIBLE AFTER TREATMENT:  FEVER GREATER THAN 100.5 F  CHILLS WITH OR WITHOUT FEVER  NAUSEA AND VOMITING THAT IS NOT CONTROLLED WITH YOUR NAUSEA MEDICATION  UNUSUAL SHORTNESS OF BREATH  UNUSUAL BRUISING OR BLEEDING  TENDERNESS IN MOUTH AND THROAT WITH OR WITHOUT PRESENCE OF ULCERS  URINARY PROBLEMS  BOWEL PROBLEMS  UNUSUAL RASH    Wear comfortable clothing and clothing appropriate for easy access to any Portacath or PICC line. Let us know if there is anything that we can do to make your therapy better!   I have been informed and understand all of the instructions given to me and have  received a copy. I have been instructed to call the clinic [phone] or my family physician as soon as possible for continued medical care, if indicated. I do not have any more questions at this time but understand that I may call the Cancer Center or the Patient Navigator at [phone] during office hours should I have questions or need assistance in obtaining follow-up care.      _________________________________________      _______________     __________ Signature of Patient or Authorized Representative        Date                            Time      _________________________________________ Nurse's Signature

## 2012-01-08 NOTE — Progress Notes (Signed)
HPI: Pleasant male for fu of CAD, non-ischemic cardiomyopathy (cardiomyopathy out of proportion to CAD), systolic CHF, HL, metastatic rectal carcinoma. He underwent thrombectomy and BMS to the OM2 11/11. He was admitted 7/13 with chest pain. D-dimer was elevated. Chest CT 11/02/11: Mild interstitial edema with small pleural effusions, no pulmonary embolus, solitary 6 mm left upper lobe nodule. Myoview 11/04/11: Medium sized area of mild reversibility involving left apex, EF 16%. Echo 11/02/11: EF 30-35%, severe HK at the inferior, septal and posterior walls, mild MR, moderate LAE. LHC via right radial artery 11/06/11: OM1 30%, OM2 patent stent with 20% ISR proximal, RCA luminal irregularities. Chest discomfort was felt to be noncardiac. Recent CTA of his chest in September of 2013 showed no pulmonary embolus, marked increase in left pleural effusion and very small pericardial effusion. Patient had drainage of parapneumonic effusion. Since he was last seen, his dyspnea is much improved following drainage of his pleural effusion. There is no dyspnea on exertion, orthopnea, chest pain or pedal edema. No syncope.    Current Outpatient Prescriptions  Medication Sig Dispense Refill  . aspirin EC 81 MG tablet Take 81 mg by mouth daily.      . benzonatate (TESSALON) 100 MG capsule Take 100 mg by mouth 3 (three) times daily as needed. For cough.      Marland Kitchen guaiFENesin-dextromethorphan (ROBITUSSIN DM) 100-10 MG/5ML syrup Take 5 mLs by mouth every 6 (six) hours as needed for cough.  480 mL  0  . HYDROcodone-acetaminophen (NORCO/VICODIN) 5-325 MG per tablet Take 1 tablet by mouth every 4 (four) hours as needed. For pain.  30 tablet  0  . IRON PO Take 1 tablet by mouth daily.       . pantoprazole (PROTONIX) 40 MG tablet Take 40 mg by mouth daily.        . pravastatin (PRAVACHOL) 20 MG tablet Take 20 mg by mouth at bedtime.        . prochlorperazine (COMPAZINE) 10 MG tablet Take 10 mg by mouth every 6 (six) hours as needed.  nausea      . Tamsulosin HCl (FLOMAX) 0.4 MG CAPS Take 0.4 mg by mouth daily.       . vitamin B-12 (CYANOCOBALAMIN) 1000 MCG tablet Take 1,000 mcg by mouth daily.       . vitamin C (ASCORBIC ACID) 500 MG tablet Take 500 mg by mouth daily.         Past Medical History  Diagnosis Date  . CAD (coronary artery disease)     a. s/p MI/Cath 02/15/10: thrombectomy and stent in second obtuse marginal ;  b. 10/2011 Cath: patent OM stent, otw nonobs dzs -> med Rx.  Marland Kitchen Hyperlipidemia   . Blood in urine   . Cancer     a. colon- colonoscopy 7/10 no evidence of recurrent  . Ischemic cardiomyopathy     a. 11/02/2011 Echo: EF 30-35%, sev HK of inf, sept, post walls.  Sev dilated LV, mild MR, mod dil LA.  Marland Kitchen Chronic systolic CHF (congestive heart failure)   . Pulmonary nodule, left     a. CT 11/01/2011 6mm solitary pulmonary nodule ant LUL - f/u CT 6 months (04/2012)  . Cough     a. with pleuritic chest pain 11/2011    Past Surgical History  Procedure Date  . Proctoscopy   . Ureteral repair   . Cardiac stents   . Colon surgery     abdomeno perineal resection  . Colostomy   .  Portacath placement     History   Social History  . Marital Status: Single    Spouse Name: N/A    Number of Children: N/A  . Years of Education: N/A   Occupational History  . Not on file.   Social History Main Topics  . Smoking status: Never Smoker   . Smokeless tobacco: Never Used  . Alcohol Use: No  . Drug Use: No  . Sexually Active: Not on file   Other Topics Concern  . Not on file   Social History Narrative  . No narrative on file    ROS: no fevers or chills, productive cough, hemoptysis, dysphasia, odynophagia, melena, hematochezia, dysuria, hematuria, rash, seizure activity, orthopnea, PND, pedal edema, claudication. Remaining systems are negative.  Physical Exam: Well-developed well-nourished in no acute distress.  Skin is warm and dry.  HEENT is normal.  Neck is supple. Chest is mildly diminished  breath sounds left lower lobe Cardiovascular exam is regular rate and rhythm.  Abdominal exam nontender or distended. No masses palpated. Extremities show no edema. neuro grossly intact

## 2012-01-08 NOTE — Assessment & Plan Note (Signed)
Continue aspirin and statin. 

## 2012-01-09 ENCOUNTER — Encounter (HOSPITAL_COMMUNITY): Payer: BC Managed Care – PPO | Attending: Hematology and Oncology | Admitting: Oncology

## 2012-01-09 ENCOUNTER — Encounter (HOSPITAL_BASED_OUTPATIENT_CLINIC_OR_DEPARTMENT_OTHER): Payer: BC Managed Care – PPO

## 2012-01-09 DIAGNOSIS — C189 Malignant neoplasm of colon, unspecified: Secondary | ICD-10-CM

## 2012-01-09 DIAGNOSIS — C801 Malignant (primary) neoplasm, unspecified: Secondary | ICD-10-CM

## 2012-01-09 DIAGNOSIS — C20 Malignant neoplasm of rectum: Secondary | ICD-10-CM | POA: Insufficient documentation

## 2012-01-09 DIAGNOSIS — C779 Secondary and unspecified malignant neoplasm of lymph node, unspecified: Secondary | ICD-10-CM

## 2012-01-09 DIAGNOSIS — Z5111 Encounter for antineoplastic chemotherapy: Secondary | ICD-10-CM

## 2012-01-09 DIAGNOSIS — C50919 Malignant neoplasm of unspecified site of unspecified female breast: Secondary | ICD-10-CM

## 2012-01-09 MED ORDER — HEPARIN SOD (PORK) LOCK FLUSH 100 UNIT/ML IV SOLN
500.0000 [IU] | Freq: Once | INTRAVENOUS | Status: DC | PRN
  Filled 2012-01-09: qty 5

## 2012-01-09 MED ORDER — SODIUM CHLORIDE 0.9 % IV SOLN
Freq: Once | INTRAVENOUS | Status: AC
  Administered 2012-01-09: 16 mg via INTRAVENOUS
  Filled 2012-01-09: qty 8

## 2012-01-09 MED ORDER — FLUOROURACIL CHEMO INJECTION 2.5 GM/50ML
400.0000 mg/m2 | Freq: Once | INTRAVENOUS | Status: AC
  Administered 2012-01-09: 850 mg via INTRAVENOUS
  Filled 2012-01-09: qty 17

## 2012-01-09 MED ORDER — LEUCOVORIN CALCIUM INJECTION 100 MG
20.0000 mg/m2 | Freq: Once | INTRAMUSCULAR | Status: AC
  Administered 2012-01-09: 42 mg via INTRAVENOUS
  Filled 2012-01-09: qty 2.1

## 2012-01-09 MED ORDER — ATROPINE SULFATE 0.4 MG/ML IJ SOLN
0.5000 mg | Freq: Once | INTRAMUSCULAR | Status: DC | PRN
  Filled 2012-01-09: qty 1.25

## 2012-01-09 MED ORDER — SODIUM CHLORIDE 0.9 % IJ SOLN
10.0000 mL | INTRAMUSCULAR | Status: DC | PRN
  Administered 2012-01-09: 10 mL
  Filled 2012-01-09: qty 10

## 2012-01-09 MED ORDER — SODIUM CHLORIDE 0.9 % IV SOLN
2400.0000 mg/m2 | INTRAVENOUS | Status: DC
  Administered 2012-01-09: 5050 mg via INTRAVENOUS
  Filled 2012-01-09 (×2): qty 101

## 2012-01-09 MED ORDER — IRINOTECAN HCL CHEMO INJECTION 100 MG/5ML
180.0000 mg/m2 | Freq: Once | INTRAVENOUS | Status: AC
  Administered 2012-01-09: 380 mg via INTRAVENOUS
  Filled 2012-01-09: qty 19

## 2012-01-09 MED ORDER — SODIUM CHLORIDE 0.9 % IV SOLN
Freq: Once | INTRAVENOUS | Status: AC
  Administered 2012-01-09: 10:00:00 via INTRAVENOUS

## 2012-01-09 NOTE — Progress Notes (Signed)
Chemo teaching done and consent signed for folfiri with avastin. Meds called in.

## 2012-01-09 NOTE — Progress Notes (Signed)
Adam Heap, MD [address] [place] [address]  1. Recurrent, metastatic rectal cancer     CURRENT THERAPY: Embarking on cycle 1 of FOLFIRI.  Avastin on hold for 1st cycle.  INTERVAL HISTORY: Adam Henderson 56 y.o. 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  He is status post neoadjuvant chemotherapy with radiation for locally advanced adenocarcinoma of the rectum followed by an abdominoperineal resection on 07/13/2010 for stage IIIB with 13/15 positive nodes. He then went on to receive 7 months of FOLFOX 6.    The patient was initially seen in consultation by Dr. Dalene Carrow in Dr. Thornton Papas absence on 01/01/2012.  Her interval history note is appreciated below:  The patient received his oncology care until recently  under the guidance of [name] in [place]. He wishes to relocate  his care to [facility]. The patient, who is here with his mother,  reports that he was diagnosed with rectal cancer in 2011. He had  presented to his primary care physician's office with profound fatigue.  He was found to have symptomatic anemia and was admitted to [facility]  for further workup. He received a colonoscopy under the guidance of Dr.  Lina Sar that had diagnosed a rectal adenocarcinoma, which was found  to be locally advanced. He was seen by Dr. Myna Hidalgo as an inpatient and  then referred to see [name] in [place]. He received neoadjuvant  chemotherapy with radiation therapy. Chemotherapy was in the form of  oral Xeloda. The patient informs me that he received IV chemotherapy  and it may have been FOLFOX, but he is not sure. I do not have any  report from [name] office to review at this time. But in  general, the patient is a good historian. On 07/13/2010, he underwent  an abdominoperineal resection with repair of his urethra. Surgical  pathology at that time revealed a 9  cm poorly-differentiated  adenocarcinoma extending through the muscularis propria. Surgical  resection margins appeared negative for adenocarcinoma. 13 out  of 15 lymph nodes sampled had evidence of disease. There was no  evidence of perineural or lymphovascular invasion at that time.  Following this, the patient informs me that he received 3-4 months  of FOLFOX-6. He then Subsequent to this, he underwent surveillance with  [name]. In June of this year, he began to have symptoms of  pressure-like symptoms in his low abdomen. Of note, CEA level had  increased to 27.8. CT scan on July 8th had shown with presacral fluid  and gas collection of unknown etiology. Dr. [name] did perform a  colonoscopy on 10/11/2011 that essentially showed no evidence of  recurrence. The diverting colostomy was functioning. On 12/15/2011, he  received a CT scan of the abdomen and pelvis that showed a necrotic  tissue mass involving the rectum. The liver was unremarkable except for  radiographic findings of hepatic cirrhosis. There were retroperitoneal  and small bowel mesentery adenopathy. CT angiogram of his chest on  12/18/2011 showed no evidence of pulmonary embolism. There was a mild  pleural effusion. On 12/18/2011, the patient was admitted with dyspnea,  night sweats, dry cough, and chest pain. He was diagnosed with  pneumonia and found to have a large left pleural effusion. He was  placed on broad spectrum antibiotics. Of note, the chest did show an  incidental finding of a left lobe pulmonary nodule. He received a  biopsy of the pelvic soft tissue mass, which was consistent with  metastatic adenocarcinoma, felt to the colorectal in origin. He has  since been discharged home. He feels well. He is eating. He denies  pain. Colostomy is functioning without difficulty. He is not short of  breath. He told me that recent cardiac cath was unremarkable. CEA on  12/05/2011 was 14.2.  Dr. Dalene Carrow recommended  FOLFIRI + Avastin (Avastin held for the 1st cycle).   Mr. Valera underwent chemotherapy teaching recently.  All of his questions have been answered.  He denies any complaints.  His chemotherapy plan is signed and he is due to embark on cycle 1.  Avastin is held for cycle 1.    He denies any complaints.  Complete ROS questioning is negative.      Past Medical History  Diagnosis Date  . CAD (coronary artery disease)     a. s/p MI/Cath 02/15/10: thrombectomy and stent in second obtuse marginal ;  b. 10/2011 Cath: patent OM stent, otw nonobs dzs -> med Rx.  Marland Kitchen Hyperlipidemia   . Blood in urine   . Cancer     a. colon- colonoscopy 7/10 no evidence of recurrent  . Ischemic cardiomyopathy     a. 11/02/2011 Echo: EF 30-35%, sev HK of inf, sept, post walls.  Sev dilated LV, mild MR, mod dil LA.  Marland Kitchen Chronic systolic CHF (congestive heart failure)   . Pulmonary nodule, left     a. CT 11/01/2011 6mm solitary pulmonary nodule ant LUL - f/u CT 6 months (04/2012)  . Cough     a. with pleuritic chest pain 11/2011    has HYPOALBUMINEMIA; ANEMIA, IRON DEFICIENCY; CAD; CELIAC SPRUE; Recurrent, metastatic rectal cancer; Chest pain, central; Colon cancer; Pelvic fluid collection; NICM (nonischemic cardiomyopathy); Chronic systolic CHF (congestive heart failure); Pulmonary nodule, left; Acute on chronic systolic CHF (congestive heart failure), NYHA class 4; SIRS (systemic inflammatory response syndrome); Hypotension; and Pleural effusion on his problem list.      has no known allergies.  Mr. Pamer does not currently have medications on file.  Past Surgical History  Procedure Date  . Proctoscopy   . Ureteral repair   . Cardiac stents   . Colon surgery     abdomeno perineal resection  . Colostomy   . Portacath placement     Denies any headaches, dizziness, double vision, fevers, chills, night sweats, nausea, vomiting, diarrhea, constipation, chest pain, heart palpitations, shortness of breath, blood in  stool, black tarry stool, urinary pain, urinary burning, urinary frequency, hematuria.   PHYSICAL EXAMINATION  ECOG PERFORMANCE STATUS: 1 - Symptomatic but completely ambulatory  There were no vitals filed for this visit.  GENERAL:alert, no distress, well nourished, well developed, comfortable, cooperative, obese, smiling and appears older than stated age SKIN: skin color, texture, turgor are normal, no rashes or significant lesions HEAD: Normocephalic, No masses, lesions, tenderness or abnormalities EYES: normal, Conjunctiva are pink and non-injected EARS: External ears normal OROPHARYNX:lips, buccal mucosa, and tongue normal and mucous membranes are moist  NECK: supple, trachea midline LYMPH:  not examined BREAST:not examined LUNGS: clear to auscultation  HEART: regular rate & rhythm, no murmurs, no gallops, S1 normal and S2 normal ABDOMEN:abdomen soft, non-tender, obese and normal bowel sounds.  Colostomy appreciated in the LLQ producing stool appropriately. BACK: Back symmetric, no curvature., No CVA tenderness EXTREMITIES:less then 2 second capillary refill, no joint deformities, effusion, or inflammation, no edema, no skin discoloration, no clubbing, no cyanosis  NEURO: alert &  oriented x 3 with fluent speech, no focal motor/sensory deficits   LABORATORY DATA: CBC    Component Value Date/Time   WBC 10.4 12/26/2011 0519   RBC 4.50 12/26/2011 0519   HGB 11.6* 12/26/2011 0519   HCT 36.7* 12/26/2011 0519   PLT 439* 12/26/2011 0519   MCV 81.6 12/26/2011 0519   MCH 25.8* 12/26/2011 0519   MCHC 31.6 12/26/2011 0519   RDW 14.1 12/26/2011 0519   LYMPHSABS 1.4 12/18/2011 1832   MONOABS 1.2* 12/18/2011 1832   EOSABS 0.2 12/18/2011 1832   BASOSABS 0.1 12/18/2011 1832      Chemistry      Component Value Date/Time   NA 138 12/26/2011 0519   K 4.0 12/26/2011 0519   CL 98 12/26/2011 0519   CO2 29 12/26/2011 0519   BUN 7 12/26/2011 0519   CREATININE 0.59 12/26/2011 0519      Component Value  Date/Time   CALCIUM 9.2 12/26/2011 0519   ALKPHOS 77 12/18/2011 1832   AST 12 12/18/2011 1832   ALT 5 12/18/2011 1832   BILITOT 0.3 12/18/2011 1832        RADIOGRAPHIC STUDIES:  12/05/2011  *RADIOLOGY REPORT*  Clinical Data: Rectal carcinoma. Lower abdominal pain. Pelvic  fluid collection.  CT ABDOMEN AND PELVIS WITH CONTRAST  Technique: Multidetector CT imaging of the abdomen and pelvis was  performed following the standard protocol during bolus  administration of intravenous contrast.  Contrast: 80mL OMNIPAQUE IOHEXOL 300 MG/ML SOLN  Comparison: 10/09/2011  Findings: Images through the lung bases show a moderate left-sided  pleural effusion and left lower lobe atelectasis.  Hepatic cirrhosis is again noted, however no liver masses are  identified. The other abdominal parenchymal organs are normal in  appearance. Gallbladder is unremarkable. No evidence of  hydronephrosis.  A large partially necrotic soft tissue mass is again seen involving  the rectum, with extension to involve the left pelvic sidewall and  base of the urinary bladder. This measures approximately 8.6 x  10.4 cm maximum dimensions, without significant change in size  since prior study. Asymmetric diffuse wall thickening of the  urinary bladder is also stable in appearance, which may be due to  tumor, muscular hypertrophy, or cystitis.  Shotty less than 1 cm lymph nodes are again seen in the central  sigmoid mesentery and right lower quadrant small bowel mesentery  which are stable. Shotty less than 1 cm retroperitoneal lymph  nodes in the aortocaval space are also stable. No new or worsening  areas of lymphadenopathy identified.  No evidence of inflammatory process or abnormal fluid collections.  No evidence of bowel obstruction. A left lower quadrant colostomy  is again demonstrated. No suspicious bone lesions identified.  IMPRESSION:  1. No significant change in size of large, partially necrotic  rectal  mass, with involvement of the left pelvic side wall and base  of the urinary bladder.  2. Stable shotty sigmoid and small bowel mesenteric  lymphadenopathy, and retroperitoneal lymphadenopathy.  3. Increased moderate left pleural effusion and left lower lobe  atelectasis.  4. Stable hepatic cirrhosis. No evidence of hepatic neoplasm,  ascites, or abscess.  Original Report Authenticated By: Danae Orleans, M.D.   11/02/2011  *RADIOLOGY REPORT*  Clinical Data: Chest pain and pressure, history of colon cancer  CT ANGIOGRAPHY CHEST  Technique: Multidetector CT imaging of the chest using the  standard protocol during bolus administration of intravenous  contrast. Multiplanar reconstructed images including MIPs were  obtained and reviewed to evaluate the vascular  anatomy.  Contrast: OMNIPAQUE IOHEXOL 350 MG/ML SOLN  Comparison: None.  Findings: There is mild, basilar predominant, smooth thickening of  the interlobular septa and intralobular interstitia, as well as  small bilateral pleural effusions, consistent with mild pulmonary  edema. Moderate cardiomegaly is also present.  There are no filling defects within either pulmonary arterial  system to suggest a segmental or subsegmental pulmonary embolus.  The thoracic aorta has a normal caliber and appearance. Coronary  artery atherosclerotic calcification is noted. There is no  axillary, mediastinal, or hilar adenopathy.  There is a 6 mm solitary pulmonary nodule in the anterior left  upper lobe on image 32 ( If the patient is at high risk for  bronchogenic carcinoma, follow-up chest CT at 6-12 months is  recommended. If the patient is at low risk for bronchogenic  carcinoma, follow-up chest CT at 12 months is recommended. This  recommendation follows the consensus statement: Guidelines for  Management of Small Pulmonary Nodules Detected on CT Scans: A  Statement from the Fleischner Society as published in Radiology  2005;  237:395-400. ) The osseous structures and visualized upper  abdomen are unremarkable.  IMPRESSION:  Cardiomegaly and mild interstitial edema with small pleural  effusions.  There is no evidence of pulmonary embolus.  Solitary 6 mm left upper lobe pulmonary nodule. Please see above  recommendations.  Original Report Authenticated By: Brandon Melnick, M.D    PATHOLOGY: 12/05/2011  FINAL DIAGNOSIS Diagnosis Soft tissue mass, biopsy, pelvic - METASTATIC ADENOCARCINOMA, CONSISTENT WITH COLORECTAL PRIMARY, SEE COMMENT. Microscopic Comment The specimen consists of largely acellular necrotic debris and necrotic tumor cells. There are scant fragments of viable tumor that demonstrate the following immunophenotype: Cytokeratin 7 - negative expression Cytokeratin 20 - negative expression CDX2 - strong diffuse expression The history of rectal adenocarcinoma is noted ([id number]). Overall, the morphology and immunophenotype are that of metastatic colorectal adenocarcinoma. [place] RUND DO Pathologist, Electronic Signature (Case signed 12/07/2011)     ASSESSMENT:  1. 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  He is status post neoadjuvant chemotherapy with radiation for locally advanced adenocarcinoma of the rectum followed by an abdominoperineal resection on 07/13/2010 for stage IIIB with 13/15 positive nodes. He then went on to receive 7 months of FOLFOX 6.  Dr. Dalene Carrow recommended treatment with Avastin + FOLFIRI. 2. Nonischemic cardiomyopathy with ejection fraction of 30%-35%.  3. Hepatic cirrhosis per CT criteria, denies EtOH abuse and viral hepatitis.   PLAN:  1. Pre-chemo therapy lab work ordered as standing order.  Will add urine dipstick for Avastin. 2. Chart review performed.  Labs and CTs reviewed.  3. Will administer cycle 1 of chemotherapy today as scheduled.  Avastin will be held for cycle 1 and will be administered for cycle 2.     4. Chemotherapy and antibody plan signed.  5. Return as scheduled for follow-up.   All questions were answered. The patient knows to call the clinic with any problems, questions or concerns. We can certainly see the patient much sooner if necessary.  Bufford Helms

## 2012-01-10 ENCOUNTER — Other Ambulatory Visit: Payer: Self-pay | Admitting: Certified Registered Nurse Anesthetist

## 2012-01-10 ENCOUNTER — Other Ambulatory Visit (HOSPITAL_COMMUNITY): Payer: BC Managed Care – PPO

## 2012-01-11 ENCOUNTER — Encounter: Payer: Self-pay | Admitting: Adult Health

## 2012-01-11 ENCOUNTER — Ambulatory Visit (INDEPENDENT_AMBULATORY_CARE_PROVIDER_SITE_OTHER): Payer: BC Managed Care – PPO | Admitting: Adult Health

## 2012-01-11 ENCOUNTER — Ambulatory Visit (INDEPENDENT_AMBULATORY_CARE_PROVIDER_SITE_OTHER)
Admission: RE | Admit: 2012-01-11 | Discharge: 2012-01-11 | Disposition: A | Discharge status: Home / Self Care | Payer: BC Managed Care – PPO | Source: Ambulatory Visit | Attending: Adult Health | Admitting: Adult Health

## 2012-01-11 ENCOUNTER — Encounter (HOSPITAL_BASED_OUTPATIENT_CLINIC_OR_DEPARTMENT_OTHER): Payer: BC Managed Care – PPO

## 2012-01-11 VITALS — BP 96/62 | HR 80 | Temp 97.0°F | Ht 70.0 in | Wt 216.6 lb

## 2012-01-11 DIAGNOSIS — C50919 Malignant neoplasm of unspecified site of unspecified female breast: Secondary | ICD-10-CM

## 2012-01-11 DIAGNOSIS — J90 Pleural effusion, not elsewhere classified: Secondary | ICD-10-CM

## 2012-01-11 DIAGNOSIS — C801 Malignant (primary) neoplasm, unspecified: Secondary | ICD-10-CM

## 2012-01-11 DIAGNOSIS — Z5189 Encounter for other specified aftercare: Secondary | ICD-10-CM

## 2012-01-11 DIAGNOSIS — C20 Malignant neoplasm of rectum: Secondary | ICD-10-CM

## 2012-01-11 DIAGNOSIS — C189 Malignant neoplasm of colon, unspecified: Secondary | ICD-10-CM

## 2012-01-11 MED ORDER — HEPARIN SOD (PORK) LOCK FLUSH 100 UNIT/ML IV SOLN
INTRAVENOUS | Status: AC
  Filled 2012-01-11: qty 5

## 2012-01-11 MED ORDER — SODIUM CHLORIDE 0.9 % IJ SOLN
10.0000 mL | INTRAMUSCULAR | Status: DC | PRN
  Administered 2012-01-11: 10 mL
  Filled 2012-01-11: qty 10

## 2012-01-11 MED ORDER — PEGFILGRASTIM INJECTION 6 MG/0.6ML
6.0000 mg | Freq: Once | SUBCUTANEOUS | Status: AC
  Administered 2012-01-11: 6 mg via SUBCUTANEOUS

## 2012-01-11 MED ORDER — HEPARIN SOD (PORK) LOCK FLUSH 100 UNIT/ML IV SOLN
500.0000 [IU] | Freq: Once | INTRAVENOUS | Status: AC | PRN
  Administered 2012-01-11: 500 [IU]
  Filled 2012-01-11: qty 5

## 2012-01-11 MED ORDER — PEGFILGRASTIM INJECTION 6 MG/0.6ML
SUBCUTANEOUS | Status: AC
  Filled 2012-01-11: qty 0.6

## 2012-01-11 MED ORDER — SODIUM CHLORIDE 0.9 % IJ SOLN
INTRAMUSCULAR | Status: AC
  Filled 2012-01-11: qty 10

## 2012-01-11 NOTE — Patient Instructions (Addendum)
Continue on current regimen.  follow up in 2 weeks Dr. Craige Cotta  W/ chest xray and As needed

## 2012-01-11 NOTE — Progress Notes (Signed)
Adam Henderson presented for Portacath access and flush.  Proper placement of portacath confirmed by CXR.  Portacath located right chest wall accessed with  H 20 needle.  Good blood return present. Portacath flushed with 20ml NS and 500U/8ml Heparin and needle removed intact.  Procedure without incident.  Discontinued from home infusion pump.  Neulasta administered to abdomen, RLQ.  Patient also denies any complaints after his first cycle of chemo - reports he is tolerating treatment well at this point.

## 2012-01-15 ENCOUNTER — Encounter (HOSPITAL_COMMUNITY): Admission: RE | Admit: 2012-01-15 | Payer: BC Managed Care – PPO | Source: Ambulatory Visit

## 2012-01-16 NOTE — Assessment & Plan Note (Signed)
Left pleural effusion s/p thoracentesis at admission.  cytolgy neg for malignant cells tx w/ 2 weeks of abx for suspected parapneumonic effusion.  Clinically improved w/ only small increase  in effusion on xray today .  Will monitor closely  Have him return in 2 weeks with Dr. Craige Cotta  W/ cxr

## 2012-01-16 NOTE — Progress Notes (Signed)
  Subjective:    Patient ID: Adam Henderson, male    DOB: [birth year], 56 y.o.   MRN: [id number]  HPI 21 male with known history of stage III, edema, rectal cancer. Status post resection and chemotherapy in 2012. Patient has been found to have pelvic recurrence. 12/05/2011. Patient was admitted 12/18/2011 with respiratory distress, and found to have a large left pleural effusion. He underwent a thoracentesis with 1.5 L removed, and a chest tube placement Cytology was negative for malignancy. This was felt to be a parapneumonic effusion and treated with IV antibiotics. He was discharged on Levaquin for a total of 2 weeks of therapy Patient returns today feeling much improved with decreased shortness of breath Chest x-ray today shows Slight increase in left pleural effusion which remains  relatively small in size. Patient denies any hemoptysis, orthopnea, PND, or increased leg swelling    Review of Systems Constitutional:   No  weight loss, night sweats,  Fevers, chills, fatigue, or  lassitude.  HEENT:   No headaches,  Difficulty swallowing,  Tooth/dental problems, or  Sore throat,                No sneezing, itching, ear ache, nasal congestion, post nasal drip,   CV:  No chest pain,  Orthopnea, PND,  anasarca, dizziness, palpitations, syncope.   GI  No heartburn, indigestion, abdominal pain, nausea, vomiting, diarrhea, change in bowel habits, loss of appetite, bloody stools.   Resp:   No coughing up of blood.     No chest wall deformity  Skin: no rash or lesions.  GU: no dysuria, change in color of urine, no urgency or frequency.  No flank pain, no hematuria   MS:  No joint pain or swelling.  No decreased range of motion.  No back pain.  Psych:  No change in mood or affect. No depression or anxiety.  No memory loss.         Objective:   Physical Exam  GEN: A/Ox3; pleasant , NAD    HEENT:  [place]/AT,  EACs-clear, TMs-wnl, NOSE-clear, THROAT-clear, no lesions, no postnasal drip or  exudate noted.   NECK:  Supple w/ fair ROM; no JVD; normal carotid impulses w/o bruits; no thyromegaly or nodules palpated; no lymphadenopathy.  RESP  Coarse BS no accessory muscle use, no dullness to percussion  CARD:  RRR, no m/r/g  , no peripheral edema, pulses intact, no cyanosis or clubbing.  GI:   Soft & nt; nml bowel sounds; no organomegaly or masses detected.  Musco: Warm bil, no deformities or joint swelling noted.   Neuro: alert, no focal deficits noted.    Skin: Warm, no lesions or rashes        Assessment & Plan:

## 2012-01-17 ENCOUNTER — Other Ambulatory Visit (HOSPITAL_COMMUNITY): Payer: Self-pay | Admitting: Oncology

## 2012-01-17 LAB — FUNGUS CULTURE W SMEAR

## 2012-01-19 ENCOUNTER — Telehealth (HOSPITAL_COMMUNITY): Payer: Self-pay

## 2012-01-19 MED ORDER — PROCHLORPERAZINE MALEATE 10 MG PO TABS: 10.0000 mg | ORAL_TABLET | Freq: Four times a day (QID) | ORAL | Status: AC | PRN

## 2012-01-19 NOTE — Telephone Encounter (Signed)
Recommend Compazine PO every 6 hours and Zofran 8 mg PO every 8 hours.  Urinary issues may be secondary to retching.  If ineffective, recommend ED visit over the weekend.  We can add Ativan if above-mentioned regimen is  ineffective over the weekend.  Recommend monitoring for fevers and report to ED for fever greater than 101, worsening of symptoms or no relief over the weekend.  Recommend increased fluids.  Do not force food.  Eat a bland diet (BRAT diet).

## 2012-01-19 NOTE — Telephone Encounter (Signed)
Evelena Leyden, RN 01/19/12 - Mrs. Adam Henderson was given the below instruction.  Verbalized understanding of instructions.   KEFALAS,THOMAS, PA 01/19/2012 3:40 PM Signed  Recommend Compazine PO every 6 hours and Zofran 8 mg PO every 8 hours. Urinary issues may be secondary to retching. If ineffective, recommend ED visit over the weekend. We can add Ativan if above-mentioned regimen is ineffective over the weekend. Recommend monitoring for fevers and report to ED for fever greater than 101, worsening of symptoms or no relief over the weekend. Recommend increased fluids. Do not force food. Eat a bland diet (BRAT diet).  Evelena Leyden, RN 01/19/2012 3:11 PM Signed  Call from Mrs. Adam Henderson and stated "Kinsler has been really sick since last Friday. Can't keep anything down. One minute he's sweating and hot the next minute he's cold. Nothing he's taking is helping his nausea and he's wetting himself all the time. He called his urologist but they can't see him until 10/29." Does not know if he's running any fever and knows that he's been taking the ondansetron but does not know if he's been taking the compazine. Wants to know what to do. Can be reached @ [phone].

## 2012-01-19 NOTE — Telephone Encounter (Signed)
Call from Mrs. Clementeen Graham and stated "Orvan has been really sick since last Friday.  Can't keep anything down.  One minute he's sweating and hot the next minute he's cold.  Nothing he's taking is helping his nausea and he's wetting himself all the time.  He called his urologist but they can't see him until 10/29."  Does not know if he's running any fever and knows that he's been taking the ondansetron but does not know if he's been taking the compazine.  Wants to know what to do.  Can be reached @ [phone].

## 2012-01-22 ENCOUNTER — Encounter (HOSPITAL_COMMUNITY): Payer: Self-pay | Admitting: *Deleted

## 2012-01-22 ENCOUNTER — Encounter (HOSPITAL_BASED_OUTPATIENT_CLINIC_OR_DEPARTMENT_OTHER): Payer: BC Managed Care – PPO | Admitting: Oncology

## 2012-01-22 ENCOUNTER — Ambulatory Visit (HOSPITAL_COMMUNITY)
Admission: RE | Admit: 2012-01-22 | Discharge: 2012-01-22 | Disposition: A | Discharge status: Home / Self Care | Payer: BC Managed Care – PPO | Source: Ambulatory Visit | Attending: Oncology | Admitting: Oncology

## 2012-01-22 ENCOUNTER — Other Ambulatory Visit (HOSPITAL_COMMUNITY): Payer: Self-pay | Admitting: Oncology

## 2012-01-22 ENCOUNTER — Encounter (HOSPITAL_COMMUNITY): Payer: Self-pay

## 2012-01-22 ENCOUNTER — Ambulatory Visit (HOSPITAL_COMMUNITY): Admission: RE | Admit: 2012-01-22 | Payer: BC Managed Care – PPO | Source: Ambulatory Visit

## 2012-01-22 ENCOUNTER — Observation Stay (HOSPITAL_COMMUNITY)
Admission: EM | Admit: 2012-01-22 | Discharge: 2012-01-25 | DRG: 552 | Disposition: A | Discharge status: Hospice | Payer: BC Managed Care – PPO | Attending: Internal Medicine | Admitting: Internal Medicine

## 2012-01-22 DIAGNOSIS — I428 Other cardiomyopathies: Secondary | ICD-10-CM

## 2012-01-22 DIAGNOSIS — J90 Pleural effusion, not elsewhere classified: Secondary | ICD-10-CM

## 2012-01-22 DIAGNOSIS — Z923 Personal history of irradiation: Secondary | ICD-10-CM | POA: Insufficient documentation

## 2012-01-22 DIAGNOSIS — I2510 Atherosclerotic heart disease of native coronary artery without angina pectoris: Secondary | ICD-10-CM

## 2012-01-22 DIAGNOSIS — E8809 Other disorders of plasma-protein metabolism, not elsewhere classified: Secondary | ICD-10-CM

## 2012-01-22 DIAGNOSIS — D649 Anemia, unspecified: Secondary | ICD-10-CM

## 2012-01-22 DIAGNOSIS — I5022 Chronic systolic (congestive) heart failure: Secondary | ICD-10-CM

## 2012-01-22 DIAGNOSIS — Z7982 Long term (current) use of aspirin: Secondary | ICD-10-CM

## 2012-01-22 DIAGNOSIS — I959 Hypotension, unspecified: Secondary | ICD-10-CM

## 2012-01-22 DIAGNOSIS — R112 Nausea with vomiting, unspecified: Secondary | ICD-10-CM

## 2012-01-22 DIAGNOSIS — Z951 Presence of aortocoronary bypass graft: Secondary | ICD-10-CM | POA: Insufficient documentation

## 2012-01-22 DIAGNOSIS — Z85048 Personal history of other malignant neoplasm of rectum, rectosigmoid junction, and anus: Secondary | ICD-10-CM

## 2012-01-22 DIAGNOSIS — E785 Hyperlipidemia, unspecified: Secondary | ICD-10-CM | POA: Diagnosis present

## 2012-01-22 DIAGNOSIS — E876 Hypokalemia: Secondary | ICD-10-CM

## 2012-01-22 DIAGNOSIS — C20 Malignant neoplasm of rectum: Secondary | ICD-10-CM

## 2012-01-22 DIAGNOSIS — I2589 Other forms of chronic ischemic heart disease: Secondary | ICD-10-CM | POA: Insufficient documentation

## 2012-01-22 DIAGNOSIS — R1115 Cyclical vomiting syndrome unrelated to migraine: Secondary | ICD-10-CM | POA: Insufficient documentation

## 2012-01-22 DIAGNOSIS — Z9221 Personal history of antineoplastic chemotherapy: Secondary | ICD-10-CM

## 2012-01-22 DIAGNOSIS — R6510 Systemic inflammatory response syndrome (SIRS) of non-infectious origin without acute organ dysfunction: Secondary | ICD-10-CM

## 2012-01-22 DIAGNOSIS — C50919 Malignant neoplasm of unspecified site of unspecified female breast: Secondary | ICD-10-CM | POA: Diagnosis present

## 2012-01-22 DIAGNOSIS — Z933 Colostomy status: Secondary | ICD-10-CM

## 2012-01-22 DIAGNOSIS — Z66 Do not resuscitate: Secondary | ICD-10-CM | POA: Diagnosis present

## 2012-01-22 DIAGNOSIS — Z79899 Other long term (current) drug therapy: Secondary | ICD-10-CM

## 2012-01-22 DIAGNOSIS — D689 Coagulation defect, unspecified: Secondary | ICD-10-CM

## 2012-01-22 DIAGNOSIS — D509 Iron deficiency anemia, unspecified: Secondary | ICD-10-CM

## 2012-01-22 DIAGNOSIS — R32 Unspecified urinary incontinence: Secondary | ICD-10-CM | POA: Insufficient documentation

## 2012-01-22 DIAGNOSIS — R209 Unspecified disturbances of skin sensation: Secondary | ICD-10-CM

## 2012-01-22 DIAGNOSIS — R5381 Other malaise: Secondary | ICD-10-CM

## 2012-01-22 DIAGNOSIS — K651 Peritoneal abscess: Secondary | ICD-10-CM

## 2012-01-22 DIAGNOSIS — K900 Celiac disease: Secondary | ICD-10-CM

## 2012-01-22 DIAGNOSIS — E669 Obesity, unspecified: Secondary | ICD-10-CM | POA: Diagnosis present

## 2012-01-22 DIAGNOSIS — R64 Cachexia: Secondary | ICD-10-CM | POA: Diagnosis present

## 2012-01-22 DIAGNOSIS — R911 Solitary pulmonary nodule: Secondary | ICD-10-CM

## 2012-01-22 DIAGNOSIS — R933 Abnormal findings on diagnostic imaging of other parts of digestive tract: Secondary | ICD-10-CM | POA: Insufficient documentation

## 2012-01-22 DIAGNOSIS — R188 Other ascites: Secondary | ICD-10-CM

## 2012-01-22 DIAGNOSIS — E871 Hypo-osmolality and hyponatremia: Secondary | ICD-10-CM

## 2012-01-22 DIAGNOSIS — R52 Pain, unspecified: Secondary | ICD-10-CM | POA: Insufficient documentation

## 2012-01-22 DIAGNOSIS — I5023 Acute on chronic systolic (congestive) heart failure: Secondary | ICD-10-CM

## 2012-01-22 DIAGNOSIS — I252 Old myocardial infarction: Secondary | ICD-10-CM | POA: Insufficient documentation

## 2012-01-22 DIAGNOSIS — A419 Sepsis, unspecified organism: Principal | ICD-10-CM

## 2012-01-22 DIAGNOSIS — I509 Heart failure, unspecified: Secondary | ICD-10-CM | POA: Diagnosis present

## 2012-01-22 DIAGNOSIS — R079 Chest pain, unspecified: Secondary | ICD-10-CM

## 2012-01-22 LAB — COMPREHENSIVE METABOLIC PANEL
BUN: 14 mg/dL (ref 6–23)
CO2: 25 mEq/L (ref 19–32)
Calcium: 8.2 mg/dL — ABNORMAL LOW (ref 8.4–10.5)
Chloride: 82 mEq/L — ABNORMAL LOW (ref 96–112)
Creatinine, Ser: 0.94 mg/dL (ref 0.50–1.35)
GFR calc non Af Amer: >90 mL/min (ref >90)
Total Bilirubin: 1 mg/dL (ref 0.3–1.2)

## 2012-01-22 LAB — URINALYSIS, ROUTINE W REFLEX MICROSCOPIC
Bilirubin Urine: NEGATIVE
Glucose, UA: NEGATIVE mg/dL
Specific Gravity, Urine: 1.01 (ref 1.005–1.030)
pH: 5.5 (ref 5.0–8.0)

## 2012-01-22 LAB — CBC WITH DIFFERENTIAL/PLATELET
Basophils Relative: 1 % (ref 0–1)
Eosinophils Absolute: 0 10*3/uL (ref 0.0–0.7)
Eosinophils Relative: 0 % (ref 0–5)
Hemoglobin: 10.4 g/dL — ABNORMAL LOW (ref 13.0–17.0)
Lymphocytes Relative: 12 % (ref 12–46)
Lymphs Abs: 2.6 10*3/uL (ref 0.7–4.0)
MCH: 26.5 pg (ref 26.0–34.0)
Myelocytes: 0 %
Neutro Abs: 18.3 10*3/uL — ABNORMAL HIGH (ref 1.7–7.7)
Neutrophils Relative %: 58 % (ref 43–77)
Platelets: 253 10*3/uL (ref 150–400)
Promyelocytes Absolute: 0 %
RBC: 3.92 MIL/uL — ABNORMAL LOW (ref 4.22–5.81)
WBC: 22 10*3/uL — ABNORMAL HIGH (ref 4.0–10.5)
nRBC: 0 /100 WBC

## 2012-01-22 LAB — APTT: aPTT: 34 seconds (ref 24–37)

## 2012-01-22 LAB — PROTIME-INR
INR: 1.73 — ABNORMAL HIGH (ref 0.00–1.49)
Prothrombin Time: 19.7 seconds — ABNORMAL HIGH (ref 11.6–15.2)

## 2012-01-22 LAB — URINE MICROSCOPIC-ADD ON

## 2012-01-22 MED ORDER — VANCOMYCIN HCL IN DEXTROSE 1-5 GM/200ML-% IV SOLN
1000.0000 mg | Freq: Once | INTRAVENOUS | Status: AC
  Administered 2012-01-22: 1000 mg via INTRAVENOUS
  Filled 2012-01-22: qty 200

## 2012-01-22 MED ORDER — OXYCODONE HCL 5 MG PO TABS
5.0000 mg | ORAL_TABLET | ORAL | Status: DC | PRN
  Administered 2012-01-23: 5 mg via ORAL
  Filled 2012-01-22 (×2): qty 1

## 2012-01-22 MED ORDER — PANTOPRAZOLE SODIUM 40 MG PO TBEC: 40.0000 mg | DELAYED_RELEASE_TABLET | Freq: Every day | ORAL | Status: DC

## 2012-01-22 MED ORDER — SACCHAROMYCES BOULARDII 250 MG PO CAPS
250.0000 mg | ORAL_CAPSULE | Freq: Two times a day (BID) | ORAL | Status: DC
  Administered 2012-01-22: 250 mg via ORAL
  Filled 2012-01-22: qty 1

## 2012-01-22 MED ORDER — IOHEXOL 300 MG/ML  SOLN
100.0000 mL | Freq: Once | INTRAMUSCULAR | Status: AC | PRN
  Administered 2012-01-22: 100 mL via INTRAVENOUS

## 2012-01-22 MED ORDER — BENZONATATE 100 MG PO CAPS: 100.0000 mg | ORAL_CAPSULE | Freq: Three times a day (TID) | ORAL | Status: DC | PRN

## 2012-01-22 MED ORDER — ENOXAPARIN SODIUM 40 MG/0.4ML ~~LOC~~ SOLN
40.0000 mg | SUBCUTANEOUS | Status: DC
  Administered 2012-01-22: 40 mg via SUBCUTANEOUS
  Filled 2012-01-22: qty 0.4

## 2012-01-22 MED ORDER — PIPERACILLIN-TAZOBACTAM 3.375 G IVPB
3.3750 g | Freq: Once | INTRAVENOUS | Status: AC
  Administered 2012-01-22: 3.375 g via INTRAVENOUS
  Filled 2012-01-22: qty 50

## 2012-01-22 MED ORDER — ACETAMINOPHEN 650 MG RE SUPP: 650.0000 mg | Freq: Four times a day (QID) | RECTAL | Status: DC | PRN

## 2012-01-22 MED ORDER — ACETAMINOPHEN 325 MG PO TABS
650.0000 mg | ORAL_TABLET | ORAL | Status: DC | PRN
  Administered 2012-01-23: 650 mg via ORAL
  Filled 2012-01-22: qty 2

## 2012-01-22 MED ORDER — POTASSIUM CHLORIDE CRYS ER 20 MEQ PO TBCR
40.0000 meq | EXTENDED_RELEASE_TABLET | ORAL | Status: DC
  Administered 2012-01-22 – 2012-01-23 (×2): 40 meq via ORAL
  Filled 2012-01-22: qty 1
  Filled 2012-01-22 (×2): qty 2

## 2012-01-22 MED ORDER — SODIUM CHLORIDE 0.9 % IV BOLUS (SEPSIS)
1000.0000 mL | Freq: Once | INTRAVENOUS | Status: AC
  Administered 2012-01-22: 1000 mL via INTRAVENOUS

## 2012-01-22 MED ORDER — CLINDAMYCIN PHOSPHATE 600 MG/50ML IV SOLN
600.0000 mg | Freq: Three times a day (TID) | INTRAVENOUS | Status: DC
  Administered 2012-01-23 – 2012-01-24 (×5): 600 mg via INTRAVENOUS
  Filled 2012-01-22 (×5): qty 50

## 2012-01-22 MED ORDER — SIMVASTATIN 10 MG PO TABS: 10.0000 mg | ORAL_TABLET | Freq: Every day | ORAL | Status: DC

## 2012-01-22 MED ORDER — SODIUM CHLORIDE 0.9 % IJ SOLN
INTRAMUSCULAR | Status: AC
  Administered 2012-01-22: 23:00:00
  Filled 2012-01-22: qty 3

## 2012-01-22 MED ORDER — VANCOMYCIN HCL IN DEXTROSE 1-5 GM/200ML-% IV SOLN
1000.0000 mg | Freq: Once | INTRAVENOUS | Status: AC
  Administered 2012-01-23: 1000 mg via INTRAVENOUS
  Filled 2012-01-22: qty 200

## 2012-01-22 MED ORDER — PIPERACILLIN-TAZOBACTAM 3.375 G IVPB
3.3750 g | Freq: Once | INTRAVENOUS | Status: AC
  Administered 2012-01-23: 3.375 g via INTRAVENOUS
  Filled 2012-01-22: qty 50

## 2012-01-22 MED ORDER — ASPIRIN EC 81 MG PO TBEC: 81.0000 mg | DELAYED_RELEASE_TABLET | Freq: Every day | ORAL | Status: DC

## 2012-01-22 MED ORDER — HYDROMORPHONE HCL PF 1 MG/ML IJ SOLN
1.0000 mg | INTRAMUSCULAR | Status: AC | PRN
  Administered 2012-01-22 – 2012-01-23 (×3): 1 mg via INTRAVENOUS
  Administered 2012-01-23: 2 mg via INTRAVENOUS
  Filled 2012-01-22 (×3): qty 1
  Filled 2012-01-22: qty 2

## 2012-01-22 MED ORDER — HYDROMORPHONE HCL PF 1 MG/ML IJ SOLN: 1.0000 mg | INTRAMUSCULAR | Status: DC | PRN

## 2012-01-22 MED ORDER — SODIUM CHLORIDE 0.9 % IJ SOLN
INTRAMUSCULAR | Status: AC
  Administered 2012-01-22
  Filled 2012-01-22: qty 3

## 2012-01-22 MED ORDER — GUAIFENESIN-DM 100-10 MG/5ML PO SYRP
5.0000 mL | ORAL_SOLUTION | Freq: Four times a day (QID) | ORAL | Status: DC | PRN
Start: 2012-01-22 — End: 2012-01-23

## 2012-01-22 MED ORDER — DOCUSATE SODIUM 100 MG PO CAPS
100.0000 mg | ORAL_CAPSULE | Freq: Two times a day (BID) | ORAL | Status: DC
  Administered 2012-01-22: 100 mg via ORAL
  Filled 2012-01-22: qty 1

## 2012-01-22 MED ORDER — POTASSIUM CHLORIDE IN NACL 20-0.9 MEQ/L-% IV SOLN
INTRAVENOUS | Status: DC
  Administered 2012-01-22: via INTRAVENOUS

## 2012-01-22 MED ORDER — SODIUM CHLORIDE 0.9 % IV SOLN: INTRAVENOUS | Status: DC

## 2012-01-22 MED ORDER — PROCHLORPERAZINE MALEATE 5 MG PO TABS: 10.0000 mg | ORAL_TABLET | Freq: Four times a day (QID) | ORAL | Status: DC | PRN

## 2012-01-22 MED ORDER — TRAZODONE HCL 50 MG PO TABS: 25.0000 mg | ORAL_TABLET | Freq: Every evening | ORAL | Status: DC | PRN

## 2012-01-22 MED ORDER — ONDANSETRON HCL 4 MG/2ML IJ SOLN
4.0000 mg | INTRAMUSCULAR | Status: DC | PRN
  Administered 2012-01-23 – 2012-01-24 (×4): 4 mg via INTRAVENOUS
  Filled 2012-01-22 (×4): qty 2

## 2012-01-22 MED ORDER — ONDANSETRON HCL 4 MG/2ML IJ SOLN: 4.0000 mg | Freq: Three times a day (TID) | INTRAMUSCULAR | Status: DC | PRN

## 2012-01-22 NOTE — ED Provider Notes (Addendum)
History     CSN: [id number]  Arrival date & time 01/22/12  1749   First MD Initiated Contact with Patient 01/22/12 1816      Chief Complaint  Patient presents with  . Rectal Pain    (Consider location/radiation/quality/duration/timing/severity/associated sxs/prior treatment) HPI Comments: Patient with a pmh significant for metastatic rectal cancer with recurrence.  Is currently receiving chemotherapy and is followed by Dr. Mariel Sleet and [facility].  Dr. [name] has done the most of his procedures including colostomy at [facility]  He was seen by Dr. Mariel Sleet today as he was complaining of rectal and pelvic pain.  A ct scan was performed which revealed a pelvic abscess with what appears to be extension into the buttock.  He was then sent here for possible surgical consultation.  He denies fevers.  No abd pain, nausea, or vomiting.  His discomfort is worse with lying flat on his buttocks.    The history is provided by the patient.    Past Medical History  Diagnosis Date  . CAD (coronary artery disease)     a. s/p MI/Cath 02/15/10: thrombectomy and stent in second obtuse marginal ;  b. 10/2011 Cath: patent OM stent, otw nonobs dzs -> med Rx.  Marland Kitchen Hyperlipidemia   . Blood in urine   . Ischemic cardiomyopathy     a. 11/02/2011 Echo: EF 30-35%, sev HK of inf, sept, post walls.  Sev dilated LV, mild MR, mod dil LA.  Marland Kitchen Chronic systolic CHF (congestive heart failure)   . Pulmonary nodule, left     a. CT 11/01/2011 6mm solitary pulmonary nodule ant LUL - f/u CT 6 months (04/2012)  . Cough     a. with pleuritic chest pain 11/2011  . Cancer     a. colon- colonoscopy 7/10 no evidence of recurrent    Past Surgical History  Procedure Date  . Proctoscopy   . Ureteral repair   . Cardiac stents   . Colon surgery     abdomeno perineal resection  . Colostomy   . Portacath placement     Family History  Problem Relation Age of Onset  . Cancer Mother     breast  . Diabetes Father   .  Colon cancer Neg Hx     History  Substance Use Topics  . Smoking status: Never Smoker   . Smokeless tobacco: Never Used  . Alcohol Use: No      Review of Systems  All other systems reviewed and are negative.    Allergies  Review of patient's allergies indicates no known allergies.  Home Medications   Current Outpatient Rx  Name Route Sig Dispense Refill  . ASPIRIN EC 81 MG PO TBEC Oral Take 81 mg by mouth daily.    Marland Kitchen BENZONATATE 100 MG PO CAPS Oral Take 100 mg by mouth 3 (three) times daily as needed. For cough.    . AVASTIN IV Intravenous Inject into the vein every 14 (fourteen) days.    Marland Kitchen VITAMIN B-12 PO Oral Take 2,500 mcg by mouth daily.    Marland Kitchen DEXAMETHASONE 4 MG PO TABS Oral Take 8 mg by mouth. Starting the day after chemo, take 2 tablets in the am and 2 tablets in the pm for 2 days. Take with food.    . IRINOTECAN CHEMO IV INFUSION Intravenous Inject into the vein every 14 (fourteen) days.    . GUAIFENESIN-DM 100-10 MG/5ML PO SYRP Oral Take 5 mLs by mouth every 6 (six) hours as needed  for cough. 480 mL 0  . HYDROCODONE-ACETAMINOPHEN 5-325 MG PO TABS Oral Take 1 tablet by mouth every 4 (four) hours as needed. For pain. 30 tablet 0  . LEUCOVORIN CALCIUM INJECTION **10 MG/ML** Intravenous Inject into the vein every 14 (fourteen) days.    Marland Kitchen ONDANSETRON HCL 8 MG PO TABS Oral Take 8 mg by mouth. Starting the day after chemo, take 1 tablet in the am and 1 tablet in the pm for 2 days. Then may take 1 tablet two times a day IF needed for nausea/vomiting. (side effect of this medication is constipation)    . PANTOPRAZOLE SODIUM 40 MG PO TBEC Oral Take 40 mg by mouth daily.      Marland Kitchen PRAVASTATIN SODIUM 20 MG PO TABS Oral Take 20 mg by mouth at bedtime.      Marland Kitchen PROCHLORPERAZINE MALEATE 10 MG PO TABS Oral Take 1 tablet (10 mg total) by mouth every 6 (six) hours as needed. nausea 30 tablet 2  . FLUOROURACIL (5FU) CHEMO IV INFUSION PUMP CALGB 47829 Intravenous Inject into the vein. Inject every  14 days. To receive this via ambulatory pump over 46 hours.    . TAMSULOSIN HCL 0.4 MG PO CAPS Oral Take 0.4 mg by mouth every morning.     Marland Kitchen VITAMIN C 500 MG PO TABS Oral Take 500 mg by mouth daily.    Marland Kitchen LIDOCAINE-PRILOCAINE 2.5-2.5 % EX CREA Topical Apply 1 application topically as needed. Apply a quarter sized amount to port site 1 hour prior to chemo. Do not rub in. Cover with plastic.      BP 105/62  Pulse 101  Temp 97.8 F (36.6 C) (Oral)  Resp 16  Ht 5\' 10"  (1.778 m)  Wt 200 lb (90.719 kg)  BMI 28.70 kg/m2  SpO2 100%  Physical Exam  Nursing note and vitals reviewed. Constitutional: He is oriented to person, place, and time. No distress.       Patient appears chronically ill.  HENT:  Head: Normocephalic and atraumatic.  Neck: Normal range of motion. Neck supple.  Cardiovascular: Normal rate and regular rhythm.   No murmur heard. Pulmonary/Chest: Effort normal and breath sounds normal.  Abdominal: Soft. Bowel sounds are normal.  Genitourinary:       The exam of the buttocks reveals an erythematous, ttp, fluctuant area to the left side.    Musculoskeletal: Normal range of motion.  Neurological: He is alert and oriented to person, place, and time.  Skin: Skin is warm and dry. He is not diaphoretic.    ED Course  Procedures (including critical care time)  Labs Reviewed  CBC WITH DIFFERENTIAL - Abnormal; Notable for the following:    WBC 22.0 (*)     RBC 3.92 (*)     Hemoglobin 10.4 (*)     HCT 31.2 (*)     RDW 16.4 (*)     Band Neutrophils 25 (*)     Neutro Abs 18.3 (*)     Basophils Absolute 0.2 (*)     All other components within normal limits  COMPREHENSIVE METABOLIC PANEL - Abnormal; Notable for the following:    Sodium 121 (*)     Potassium 3.2 (*)     Chloride 82 (*)     Glucose, Bld 123 (*)     Calcium 8.2 (*)     Albumin 2.3 (*)     Alkaline Phosphatase 118 (*)     All other components within normal limits  CULTURE, BLOOD (ROUTINE  X 2)  CULTURE,  BLOOD (ROUTINE X 2)   Ct Abdomen Pelvis W Contrast  01/22/2012  *RADIOLOGY REPORT*  Clinical Data: Rectal cancer post diverting colostomy and pelvic irradiation, now with gluteal, sacral and coccygeal pain greater on the right, unable to sit  CT ABDOMEN AND PELVIS WITH CONTRAST  Technique:  Multidetector CT imaging of the abdomen and pelvis was performed following the standard protocol during bolus administration of intravenous contrast. Sagittal and coronal MPR images reconstructed from axial data set.  Contrast: OMNIPAQUE IOHEXOL 300 MG/ML  SOLN Dilute oral contrast.  Comparison: 12/05/2011  Findings: Atelectasis left lung base. Slightly nodular hepatic contours with enlargement of lateral segment left lobe and smaller right lobe suggestive of cirrhosis. Spleen appears mildly enlarged, 60.7 x 11.5 x 10.2 cm. Distended gallbladder without calcification. No focal abnormalities of liver, spleen, kidneys, or adrenal glands. Tiny low attenuation nodule at tail of pancreas 9 mm diameter image 28, unchanged since previous exam as well as an earlier study from 02/18/2010, likely benign. Scattered normal-sized retroperitoneal lymph nodes. Stomach incompletely distended, unable to adequately assess proximal wall thickness. Left lower quadrant sigmoid colostomy.  Large area of abnormal soft tissue in pelvis compatible with combination of known tumor and post radiation therapy change. Extension of infiltration throughout perirectal fat to the pelvic side walls bilaterally, superiorly along the presacral space to the pelvic brim, anteriorly to the urinary bladder, and to small bowel loops in the mid pelvis. Additionally, diffuse infiltration of the tissue planes and subcutaneous fat extends inferiorly from the perirectal soft tissues throughout the buttocks bilaterally. Multiple foci of soft tissue gas are identified within the tumor mass, in the pelvis bilaterally, in the left presacral space superiorly, and within  the right buttock. These may represent infection or the sequela of rectal tumor perforation. The only focal measurable gas and fluid collection is in the superior posterior left pelvis, 3.2 x 1.9 x 2.7 cm question small abscess. No definite left buttock gas identified.  Mild wall thickening of the adjacent small bowel loop in the pelvis may be related to tumor involvement or radiation enteritis. Marked thickening of the bladder wall is identified question related to tumor involvement, bladder inflammation, or radiation therapy. Significant air is present within urinary bladder which can be seen as a result of colovesicle fistula formation or prior catheterization. Multiple normal-sized inguinal lymph nodes bilaterally. Degenerative disc disease changes L5-S1. Mild sclerosis in the right sacrum adjacent to SI joint, nonspecific, could be result of sacroiliitis though metastatic lesion and infection are not excluded; this site is new since 02/18/2010.  IMPRESSION: Large rectal tumor mass with extensive infiltration of surrounding the perirectal and pelvic soft tissue planes compatible with combination of tumor and post radiation therapy. Multiple foci of extraluminal gas are not identified the pelvis, can be seen with infection, rectal mass perforation, or tumor necrosis. Single small gas and fluid collection posterior left pelvis in presacral space 3.2 x 1.9 x 2.7 cm question small abscess. Additional extension of foci of gas into the left buttock highly suspicious for infection. Thickened small bowel loop and pelvis without obstruction question radiation enteritis versus tumor involvement. Bladder wall thickening either from radiation therapy or direct tumor extension with gas in urinary bladder which can be seen with a fistula or catheterization. Question cirrhosis with splenomegaly. Slight sclerosis within the right sacrum adjacent to the SI joint potentially related to asymmetric sacroiliitis though new since  2011, cannot completely exclude osseous metastasis or less likely infection.  Findings discussed with Dr. Mariel Sleet at time of interpretation 01/22/2012 at 1725 hours.   Original Report Authenticated By: Lollie Marrow, M.D.      No diagnosis found.    MDM  The patient was sent here for treatment of what appears to be a pelvic abscess with extension to the buttocks.  His lab tests reveal a wbc of 22k along with a 25% band count.  I am unsure as to how much of this is related to infection and how much is neupogen from 1 1/2 weeks ago.  He was given vancomycin and zosyn.  I spoke with Dr. Harlon Flor from [facility] who believes this will likely require a percutaneous drain, however due to the low sodium would like for this patient to be an admission to medicine.  I will speak with Dr. Orvan Falconer who is the hospitalist tonight at [facility].  He will admit the patient to his service.        Geoffery Lyons, MD 01/22/12 1610  Geoffery Lyons, MD 01/22/12 2108

## 2012-01-22 NOTE — ED Notes (Signed)
Pt reporting mild nausea, denies pain at present time.  IVF infusing through port with no difficulty.  Pt denies needs at present.

## 2012-01-22 NOTE — Progress Notes (Signed)
Recurrent rectal adenocarcinoma with extensive local regional disease now with probable invasion into the soft tissues of the perirectal space with infection, gas within the soft tissues, thickening of the skin of the buttocks, erythema of the same areas and tremendous tenderness in these areas more right-sided and left-sided. CAT scan shows gas within the tissues as mentioned above extensive stranding of the tissues in the gluteal areas.  He is accompanied by his mother. He has had some chilling sensation. Temperature is not taken yet. He is status post one cycle of chemotherapy. He is not sweating but he is very pale, weak in . This area on the perirectal perianal gluteal areas is very tender very red more right-sided than left and very indurated. I suspect he has a combination of cancer recurrence and infection. I think we need to make an attempt to palliate him with antibiotics in the hospital for a few days. I don't know if he is a candidate for more chemotherapy realistically. We may have to just palliate him with the help of hospice potentially. It is so tender he cannot sit down on his right gluteal area. His prognosis is very very poor. I discussed this with his mother and him in person went over the films with him and his mother in person and I've discussed his case with emergency room physician who will get him admitted.

## 2012-01-22 NOTE — Consult Note (Signed)
ANTIBIOTIC CONSULT NOTE-Preliminary  Pharmacy Consult for Vancomycin and Zosyn Indication: Pelvic abscess/sepsis  No known allergies Patient Measurements: Height: 5\' 10"  (177.8 cm) Weight: 192 lb 14.4 oz (87.5 kg) IBW/kg (Calculated) : 73    Vital Signs: Temp: 98.8 F (37.1 C) (10/21 2231) Temp src: Oral (10/21 2231) BP: 92/57 mmHg (10/21 2231) Pulse Rate: 118  (10/21 2231)  Labs:  Basename 01/22/12 1834  WBC 22.0*  HGB 10.4*  PLT 253  LABCREA --  CREATININE 0.94    Estimated Creatinine Clearance: 92.8 ml/min (by C-G formula based on Cr of 0.94).  No results found for this basename: VANCOTROUGH:2,VANCOPEAK:2,VANCORANDOM:2,GENTTROUGH:2,GENTPEAK:2,GENTRANDOM:2,TOBRATROUGH:2,TOBRAPEAK:2,TOBRARND:2,AMIKACINPEAK:2,AMIKACINTROU:2,AMIKACIN:2, in the last 72 hours   Microbiology: Recent Results (from the past 720 hour(s))  CULTURE, BLOOD (ROUTINE X 2)     Status: Normal (Preliminary result)   Collection Time   01/22/12  7:51 PM      Component Value Range Status Comment   Specimen Description Blood PORTA CATH   Final    Special Requests BOTTLES DRAWN AEROBIC AND ANAEROBIC 8 CC EACH   Final    Culture PENDING   Incomplete    Report Status PENDING   Incomplete     Medical History: Past Medical History  Diagnosis Date  . CAD (coronary artery disease)     a. s/p MI/Cath 02/15/10: thrombectomy and stent in second obtuse marginal ;  b. 10/2011 Cath: patent OM stent, otw nonobs dzs -> med Rx.  Marland Kitchen Hyperlipidemia   . Blood in urine   . Ischemic cardiomyopathy     a. 11/02/2011 Echo: EF 30-35%, sev HK of inf, sept, post walls.  Sev dilated LV, mild MR, mod dil LA.  Marland Kitchen Chronic systolic CHF (congestive heart failure)   . Pulmonary nodule, left     a. CT 11/01/2011 6mm solitary pulmonary nodule ant LUL - f/u CT 6 months (04/2012)  . Cough     a. with pleuritic chest pain 11/2011  . Cancer     a. colon- colonoscopy 7/10 no evidence of recurrent    Medications:  Vancomycin 1 Gm IV in  the ED Zosyn 3.375 Gm IV in the ED Clindamycin 600 mg IV every 8 hours   Assessment:  This patient is a 56 yo male with PMH sig for metastatic rectal cancer who now has a pelvic abscess with extension into the buttock and an elevated temperature with increased WBCs with a left shift. Differential dx include recent chemotherapy related side effects vs infection.   Goal of Therapy:  Vancomycin troughs 15-20 mcg/ml Eradication of infection    Plan:  Preliminary review of pertinent patient information completed.  Protocol will be initiated with a one-time dose(s) of Vancomycin 1 Gm IV after the initial dose in the ED and one-time dose of Zosyn 3.375 Gm IV after the initial dose in the ED.  Jeani Hawking clinical pharmacist will complete review during morning rounds to assess patient and finalize treatment regimen.  Arelia Sneddon, [facility] 01/22/2012,11:31 PM

## 2012-01-22 NOTE — ED Notes (Signed)
Pain, redness to rt buttock ,  Had ct scan done today and sent by Dr Jodene Nam to ER for treatment.  NVD.Pt is on chemo

## 2012-01-22 NOTE — ED Notes (Signed)
Beeped on call general surgeon at [facility] through [facility].

## 2012-01-22 NOTE — Progress Notes (Signed)
Adam Henderson called the office and reported pain, swelling, warmth, darker skin to right buttocks; symptoms began yesterday (01/21/12).  He also reported low grade temp x yesterday as well; denies any swelling to either bilateral LE.  Per Dr. Mariel Sleet, patient needs to come in today for a CT of his abdomen/pelvis with contrast.  Adam Henderson contacted and advised of same, and he stated he needs to get a ride and may be here for his CT by 1400 today.  Adam Henderson was also informed he needed to be NPO and will need to drink contrast prior to having his study.

## 2012-01-22 NOTE — ED Notes (Signed)
Beeped Dr. Campbell to EDP's phone. 

## 2012-01-22 NOTE — H&P (Signed)
[facility] History and Physical  Adam Henderson  ZOX:[id number]  DOB: Jul 14, 1955   DOA: 01/22/2012   PCP:   [name], MD   Chief Complaint:  Tenderness of right buttock, days  HPI: Adam Henderson is an 56 y.o. male.   Caucasian gentleman with colorectal cancer, now recurrent with metastases to the pelvis, had a CT abdomen and pelvis  done today after he noted this extreme pain and tenderness of the right buttock, making it impossible to sit, associated with fever and chills. CT revealed cavitary lesion in the pelvis, with gas in the subcutaneous tissues; possible abscesses and cellulitis possible necrotic tumor. He was assessed by his oncologist and his condition appreciated to be very grave, admission has been requested for treatment of possible cellulitis/abscess.   Output from his colostomy bag continues to be watery brown. Denies nausea or vomiting.  He is having some urinary frequency and some urinary incontinence, and keeps a towel over his pelvis to soak up urine.  Rewiew of Systems:   All systems negative except as marked bold or noted in the HPI;  Constitutional: Negative for malaise; fever and chills. ;  Eyes: Negative for eye pain, redness and discharge. ;  ENMT: Negative for ear pain, hoarseness, nasal congestion, sinus pressure and sore throat. ;  Cardiovascular: Negative for chest pain, palpitations, diaphoresis, dyspnea and peripheral edema. ;  Respiratory: Negative for cough, hemoptysis, wheezing and stridor. ;  Gastrointestinal: Negative for nausea, vomiting, diarrhea, constipation, abdominal pain, melena, blood in stool, hematemesis, jaundice and rectal bleeding. unusual weight loss..   Genitourinary: Negative for frequency,  and hematuria; dysuria, incontinence,flank pain Musculoskeletal: Negative for back pain and neck pain. Negative for swelling and trauma.;  Skin: . Negative for pruritus, rash, abrasions, bruising and skin lesion.; ulcerations Neuro:  Negative for headache, lightheadedness and neck stiffness. Negative for weakness, altered level of consciousness , altered mental status, extremity weakness, burning feet, involuntary movement, seizure and syncope.  Psych: negative for anxiety, depression, insomnia, tearfulness, panic attacks, hallucinations, paranoia, suicidal or homicidal ideation    Past Medical History  Diagnosis Date  . CAD (coronary artery disease)     a. s/p MI/Cath 02/15/10: thrombectomy and stent in second obtuse marginal ;  b. 10/2011 Cath: patent OM stent, otw nonobs dzs -> med Rx.  Marland Kitchen Hyperlipidemia   . Blood in urine   . Ischemic cardiomyopathy     a. 11/02/2011 Echo: EF 30-35%, sev HK of inf, sept, post walls.  Sev dilated LV, mild MR, mod dil LA.  Marland Kitchen Chronic systolic CHF (congestive heart failure)   . Pulmonary nodule, left     a. CT 11/01/2011 6mm solitary pulmonary nodule ant LUL - f/u CT 6 months (04/2012)  . Cough     a. with pleuritic chest pain 11/2011  . Cancer     a. colon- colonoscopy 7/10 no evidence of recurrent    Past Surgical History  Procedure Date  . Proctoscopy   . Ureteral repair   . Cardiac stents   . Colon surgery     abdomeno perineal resection  . Colostomy   . Portacath placement     Medications:  HOME MEDS: Prior to Admission medications   Medication Sig Start Date End Date Taking? Authorizing Provider  aspirin EC 81 MG tablet Take 81 mg by mouth daily.   Yes Historical Provider, MD  benzonatate (TESSALON) 100 MG capsule Take 100 mg by mouth 3 (three) times daily as needed. For cough.  Yes Historical Provider, MD  Bevacizumab (AVASTIN IV) Inject into the vein every 14 (fourteen) days.   Yes Historical Provider, MD  Cyanocobalamin (VITAMIN B-12 PO) Take 2,500 mcg by mouth daily.   Yes Historical Provider, MD  dexamethasone (DECADRON) 4 MG tablet Take 8 mg by mouth. Starting the day after chemo, take 2 tablets in the am and 2 tablets in the pm for 2 days. Take with food.   Yes  Historical Provider, MD  dextrose 5 % SOLN 500 mL with irinotecan 100 MG/5ML SOLN Inject into the vein every 14 (fourteen) days.   Yes Historical Provider, MD  guaiFENesin-dextromethorphan (ROBITUSSIN DM) 100-10 MG/5ML syrup Take 5 mLs by mouth every 6 (six) hours as needed for cough. 12/28/11  Yes Richarda Overlie, MD  HYDROcodone-acetaminophen (NORCO/VICODIN) 5-325 MG per tablet Take 1 tablet by mouth every 4 (four) hours as needed. For pain. 12/28/11  Yes Richarda Overlie, MD  leucovorin (WELLCOVORIN) 10 MG/ML chemo injection Inject into the vein every 14 (fourteen) days.   Yes Historical Provider, MD  ondansetron (ZOFRAN) 8 MG tablet Take 8 mg by mouth. Starting the day after chemo, take 1 tablet in the am and 1 tablet in the pm for 2 days. Then may take 1 tablet two times a day IF needed for nausea/vomiting. (side effect of this medication is constipation)   Yes Historical Provider, MD  pantoprazole (PROTONIX) 40 MG tablet Take 40 mg by mouth daily.     Yes Historical Provider, MD  pravastatin (PRAVACHOL) 20 MG tablet Take 20 mg by mouth at bedtime.     Yes Historical Provider, MD  prochlorperazine (COMPAZINE) 10 MG tablet Take 1 tablet (10 mg total) by mouth every 6 (six) hours as needed. nausea 01/19/12  Yes Ellouise Newer, PA  sodium chloride 0.9 % SOLN 150 mL with fluorouracil CALGB 40981 5 GM/100ML SOLN Inject into the vein. Inject every 14 days. To receive this via ambulatory pump over 46 hours.   Yes Historical Provider, MD  Tamsulosin HCl (FLOMAX) 0.4 MG CAPS Take 0.4 mg by mouth every morning.  08/11/11  Yes Historical Provider, MD  vitamin C (ASCORBIC ACID) 500 MG tablet Take 500 mg by mouth daily.   Yes Historical Provider, MD  lidocaine-prilocaine (EMLA) cream Apply 1 application topically as needed. Apply a quarter sized amount to port site 1 hour prior to chemo. Do not rub in. Cover with plastic.    Historical Provider, MD     Allergies:  No Known Allergies  Social History:   reports  that he has never smoked. He has never used smokeless tobacco. He reports that he does not drink alcohol or use illicit drugs.  Family History: Family History  Problem Relation Age of Onset  . Cancer Mother     breast  . Diabetes Father   . Colon cancer Neg Hx      Physical Exam: Filed Vitals:   01/22/12 2015 01/22/12 2114 01/22/12 2143 01/22/12 2231  BP: 105/62 82/46 77/41  92/57  Pulse: 101 102 73 118  Temp:    98.8 F (37.1 C)  TempSrc:    Oral  Resp:   16 18  Height:      Weight:    87.5 kg (192 lb 14.4 oz)  SpO2: 100% 100% 100% 97%   Blood pressure 92/57, pulse 118, temperature 98.8 F (37.1 C), temperature source Oral, resp. rate 18, height 5\' 10"  (1.778 m), weight 87.5 kg (192 lb 14.4 oz), SpO2 97.00%.  GEN:  Pleasant middle-aged Caucasian gentleman lying in the stretcher in some painful distress; cooperative with exam PSYCH:  alert and oriented x4; does not appear anxious or depressed; affect is appropriate. HEENT: Mucous membranes pink, dry and anicteric; PERRLA; EOM intact; no cervical lymphadenopathy nor thyromegaly or carotid bruit; no JVD; Breasts:: Not examined CHEST WALL: No tenderness CHEST: Normal respiration, clear to auscultation bilaterally HEART: Tachycardic regular rhythm; no murmurs rubs or gallops BACK: No kyphosis or scoliosis; no CVA tenderness ABDOMEN: Obese, soft non-tender; colostomy pouch containing watery brown stool;; no intertriginous candida. Rectal Exam: Not done EXTREMITIES: Erythematous indurated area over the right buttock, about 15 cm x 10 cm and irregular; age-appropriate arthropathy of the hands and knees; no edema; no ulcerations. Genitalia: not examined PULSES: 2+ and symmetric SKIN: Normal hydration no rash or ulceration CNS: Cranial nerves 2-12 grossly intact no focal lateralizing neurologic deficit   Labs on Admission:  Basic Metabolic Panel:  Lab 01/22/12 1610  NA 121*  K 3.2*  CL 82*  CO2 25  GLUCOSE 123*  BUN 14    CREATININE 0.94  CALCIUM 8.2*  MG --  PHOS --   Liver Function Tests:  Lab 01/22/12 1834  AST 10  ALT 6  ALKPHOS 118*  BILITOT 1.0  PROT 7.0  ALBUMIN 2.3*   No results found for this basename: LIPASE:5,AMYLASE:5 in the last 168 hours No results found for this basename: AMMONIA:5 in the last 168 hours CBC:  Lab 01/22/12 1834  WBC 22.0*  NEUTROABS 18.3*  HGB 10.4*  HCT 31.2*  MCV 79.6  PLT 253   Cardiac Enzymes: No results found for this basename: CKTOTAL:5,CKMB:5,CKMBINDEX:5,TROPONINI:5 in the last 168 hours BNP: No components found with this basename: POCBNP:5 D-dimer: No components found with this basename: D-DIMER:5 CBG: No results found for this basename: GLUCAP:5 in the last 168 hours  Radiological Exams on Admission: Ct Abdomen Pelvis W Contrast  01/22/2012  *RADIOLOGY REPORT*  Clinical Data: Rectal cancer post diverting colostomy and pelvic irradiation, now with gluteal, sacral and coccygeal pain greater on the right, unable to sit  CT ABDOMEN AND PELVIS WITH CONTRAST  Technique:  Multidetector CT imaging of the abdomen and pelvis was performed following the standard protocol during bolus administration of intravenous contrast. Sagittal and coronal MPR images reconstructed from axial data set.  Contrast: OMNIPAQUE IOHEXOL 300 MG/ML  SOLN Dilute oral contrast.  Comparison: 12/05/2011  Findings: Atelectasis left lung base. Slightly nodular hepatic contours with enlargement of lateral segment left lobe and smaller right lobe suggestive of cirrhosis. Spleen appears mildly enlarged, 60.7 x 11.5 x 10.2 cm. Distended gallbladder without calcification. No focal abnormalities of liver, spleen, kidneys, or adrenal glands. Tiny low attenuation nodule at tail of pancreas 9 mm diameter image 28, unchanged since previous exam as well as an earlier study from 02/18/2010, likely benign. Scattered normal-sized retroperitoneal lymph nodes. Stomach incompletely distended, unable to  adequately assess proximal wall thickness. Left lower quadrant sigmoid colostomy.  Large area of abnormal soft tissue in pelvis compatible with combination of known tumor and post radiation therapy change. Extension of infiltration throughout perirectal fat to the pelvic side walls bilaterally, superiorly along the presacral space to the pelvic brim, anteriorly to the urinary bladder, and to small bowel loops in the mid pelvis. Additionally, diffuse infiltration of the tissue planes and subcutaneous fat extends inferiorly from the perirectal soft tissues throughout the buttocks bilaterally. Multiple foci of soft tissue gas are identified within the tumor mass, in the pelvis bilaterally, in  the left presacral space superiorly, and within the right buttock. These may represent infection or the sequela of rectal tumor perforation. The only focal measurable gas and fluid collection is in the superior posterior left pelvis, 3.2 x 1.9 x 2.7 cm question small abscess. No definite left buttock gas identified.  Mild wall thickening of the adjacent small bowel loop in the pelvis may be related to tumor involvement or radiation enteritis. Marked thickening of the bladder wall is identified question related to tumor involvement, bladder inflammation, or radiation therapy. Significant air is present within urinary bladder which can be seen as a result of colovesicle fistula formation or prior catheterization. Multiple normal-sized inguinal lymph nodes bilaterally. Degenerative disc disease changes L5-S1. Mild sclerosis in the right sacrum adjacent to SI joint, nonspecific, could be result of sacroiliitis though metastatic lesion and infection are not excluded; this site is new since 02/18/2010.  IMPRESSION: Large rectal tumor mass with extensive infiltration of surrounding the perirectal and pelvic soft tissue planes compatible with combination of tumor and post radiation therapy. Multiple foci of extraluminal gas are not  identified the pelvis, can be seen with infection, rectal mass perforation, or tumor necrosis. Single small gas and fluid collection posterior left pelvis in presacral space 3.2 x 1.9 x 2.7 cm question small abscess. Additional extension of foci of gas into the left buttock highly suspicious for infection. Thickened small bowel loop and pelvis without obstruction question radiation enteritis versus tumor involvement. Bladder wall thickening either from radiation therapy or direct tumor extension with gas in urinary bladder which can be seen with a fistula or catheterization. Question cirrhosis with splenomegaly. Slight sclerosis within the right sacrum adjacent to the SI joint potentially related to asymmetric sacroiliitis though new since 2011, cannot completely exclude osseous metastasis or less likely infection.  Findings discussed with Dr. Mariel Sleet at time of interpretation 01/22/2012 at 1725 hours.   Original Report Authenticated By: Lollie Marrow, M.D.       Assessment/ Present on Admission:  .Abscess of male pelvis .Recurrent, metastatic rectal cancer .CAD .Chronic systolic CHF (congestive heart failure) .HYPOALBUMINEMIA .NICM (nonischemic cardiomyopathy) .Hypotension .SIRS (systemic inflammatory response syndrome)   PLAN: Patient has indicated that he does not want heroic measures, he wants to be DO NOT RESUSCITATE; and when he dies he wants to be comfortable. He wants to get antibiotics and treatment for pain.  We'll give vigorous hydration to try to improve his pressure, and adequate Dilaudid for pain, although this might have an impact on his blood pressure.   Broad-spectrum coverage with vancomycin and Zosyn, and because of the gas in the tissues will also add clindamycin for its antitoxin effect; will add a probiotic  His situation has been discussed with Dr. Leticia Penna, on call for surgery, who feels it would be inappropriate to attempt to drain the abscess, as it would likely  cause the tracking of cancer cells out into the abdomen wall, and lead to never ending problems with draining fistulas, or nonhealing wounds.; The patient says she is already had this discussion with his oncologist.  Other plans as per orders.  Code Status: DNR  Disposition Plan: In consultation with oncologist and patient.    Daneshia Tavano Nocturnist [facility] Pager [phone]   01/22/2012, 10:58 PM

## 2012-01-23 ENCOUNTER — Encounter (HOSPITAL_COMMUNITY): Payer: Self-pay | Admitting: General Practice

## 2012-01-23 ENCOUNTER — Inpatient Hospital Stay (HOSPITAL_COMMUNITY): Payer: BC Managed Care – PPO

## 2012-01-23 ENCOUNTER — Encounter (HOSPITAL_COMMUNITY): Admission: RE | Admit: 2012-01-23 | Payer: BC Managed Care – PPO | Source: Ambulatory Visit

## 2012-01-23 DIAGNOSIS — A419 Sepsis, unspecified organism: Secondary | ICD-10-CM

## 2012-01-23 DIAGNOSIS — E876 Hypokalemia: Secondary | ICD-10-CM | POA: Diagnosis present

## 2012-01-23 DIAGNOSIS — E871 Hypo-osmolality and hyponatremia: Secondary | ICD-10-CM

## 2012-01-23 DIAGNOSIS — D689 Coagulation defect, unspecified: Secondary | ICD-10-CM | POA: Diagnosis present

## 2012-01-23 DIAGNOSIS — D649 Anemia, unspecified: Secondary | ICD-10-CM | POA: Diagnosis present

## 2012-01-23 DIAGNOSIS — Z933 Colostomy status: Secondary | ICD-10-CM

## 2012-01-23 DIAGNOSIS — R112 Nausea with vomiting, unspecified: Secondary | ICD-10-CM

## 2012-01-23 LAB — COMPREHENSIVE METABOLIC PANEL
BUN: 14 mg/dL (ref 6–23)
CO2: 23 mEq/L (ref 19–32)
Calcium: 7.3 mg/dL — ABNORMAL LOW (ref 8.4–10.5)
Creatinine, Ser: 0.89 mg/dL (ref 0.50–1.35)
GFR calc Af Amer: >90 mL/min (ref >90)
GFR calc non Af Amer: >90 mL/min (ref >90)
Glucose, Bld: 140 mg/dL — ABNORMAL HIGH (ref 70–99)
Sodium: 126 mEq/L — ABNORMAL LOW (ref 135–145)
Total Protein: 5.5 g/dL — ABNORMAL LOW (ref 6.0–8.3)

## 2012-01-23 LAB — CBC
MCH: 26.7 pg (ref 26.0–34.0)
MCHC: 33.5 g/dL (ref 30.0–36.0)
MCV: 79.7 fL (ref 78.0–100.0)
Platelets: 227 10*3/uL (ref 150–400)
RBC: 3.3 MIL/uL — ABNORMAL LOW (ref 4.22–5.81)
RDW: 16.4 % — ABNORMAL HIGH (ref 11.5–15.5)

## 2012-01-23 LAB — TSH: TSH: 1.911 u[IU]/mL (ref 0.350–4.500)

## 2012-01-23 LAB — MAGNESIUM: Magnesium: 1.5 mg/dL (ref 1.5–2.5)

## 2012-01-23 MED ORDER — DIPHENHYDRAMINE HCL 50 MG/ML IJ SOLN
12.5000 mg | Freq: Four times a day (QID) | INTRAMUSCULAR | Status: DC | PRN
Start: 2012-01-23 — End: 2012-01-25

## 2012-01-23 MED ORDER — POTASSIUM CHLORIDE IN NACL 40-0.9 MEQ/L-% IV SOLN
INTRAVENOUS | Status: DC
  Administered 2012-01-23: 125 mL/h via INTRAVENOUS
  Administered 2012-01-23 – 2012-01-25 (×3): via INTRAVENOUS

## 2012-01-23 MED ORDER — DIPHENHYDRAMINE HCL 12.5 MG/5ML PO ELIX: 12.5000 mg | ORAL_SOLUTION | Freq: Four times a day (QID) | ORAL | Status: DC | PRN

## 2012-01-23 MED ORDER — SODIUM CHLORIDE 0.9 % IJ SOLN
INTRAMUSCULAR | Status: AC
  Administered 2012-01-23: 10 mL
  Filled 2012-01-23: qty 3

## 2012-01-23 MED ORDER — NALOXONE HCL 0.4 MG/ML IJ SOLN: 0.4000 mg | INTRAMUSCULAR | Status: DC | PRN

## 2012-01-23 MED ORDER — SODIUM CHLORIDE 0.9 % IJ SOLN
INTRAMUSCULAR | Status: AC
  Filled 2012-01-23: qty 3

## 2012-01-23 MED ORDER — VANCOMYCIN HCL IN DEXTROSE 1-5 GM/200ML-% IV SOLN
1000.0000 mg | Freq: Two times a day (BID) | INTRAVENOUS | Status: DC
  Administered 2012-01-23 – 2012-01-24 (×2): 1000 mg via INTRAVENOUS
  Filled 2012-01-23 (×2): qty 200

## 2012-01-23 MED ORDER — SODIUM CHLORIDE 0.9 % IJ SOLN
INTRAMUSCULAR | Status: AC
  Administered 2012-01-23: 9 mL
  Filled 2012-01-23: qty 3

## 2012-01-23 MED ORDER — PIPERACILLIN-TAZOBACTAM 3.375 G IVPB
3.3750 g | Freq: Three times a day (TID) | INTRAVENOUS | Status: DC
  Administered 2012-01-23 – 2012-01-24 (×3): 3.375 g via INTRAVENOUS
  Filled 2012-01-23 (×4): qty 50

## 2012-01-23 MED ORDER — HYDROMORPHONE 0.3 MG/ML IV SOLN
INTRAVENOUS | Status: DC
Start: 2012-01-23 — End: 2012-01-24
  Administered 2012-01-23: 1 mg via INTRAVENOUS
  Administered 2012-01-23: 13:00:00 via INTRAVENOUS
  Administered 2012-01-24: 0.6 mg via INTRAVENOUS
  Administered 2012-01-24: 0.3 mg via INTRAVENOUS
  Filled 2012-01-23: qty 25

## 2012-01-23 MED ORDER — VITAMIN K1 10 MG/ML IJ SOLN
10.0000 mg | Freq: Once | INTRAMUSCULAR | Status: AC
  Administered 2012-01-23: 10 mg via SUBCUTANEOUS
  Filled 2012-01-23: qty 1

## 2012-01-23 MED ORDER — SODIUM CHLORIDE 0.9 % IJ SOLN: 9.0000 mL | INTRAMUSCULAR | Status: DC | PRN

## 2012-01-23 MED ORDER — PROMETHAZINE HCL 25 MG/ML IJ SOLN
12.5000 mg | Freq: Four times a day (QID) | INTRAMUSCULAR | Status: DC | PRN
  Administered 2012-01-23: 25 mg via INTRAVENOUS
  Administered 2012-01-23 (×2): 12.5 mg via INTRAVENOUS
  Filled 2012-01-23 (×3): qty 1

## 2012-01-23 MED ORDER — BIOTENE DRY MOUTH MT LIQD
15.0000 mL | Freq: Two times a day (BID) | OROMUCOSAL | Status: DC
  Administered 2012-01-23 – 2012-01-25 (×4): 15 mL via OROMUCOSAL

## 2012-01-23 MED ORDER — SODIUM CHLORIDE 0.9 % IV BOLUS (SEPSIS)
1000.0000 mL | Freq: Once | INTRAVENOUS | Status: AC
  Administered 2012-01-23: 1000 mL via INTRAVENOUS

## 2012-01-23 MED ORDER — POTASSIUM CHLORIDE 10 MEQ/100ML IV SOLN
10.0000 meq | INTRAVENOUS | Status: AC
  Administered 2012-01-23 (×2): 10 meq via INTRAVENOUS
  Filled 2012-01-23 (×2): qty 100

## 2012-01-23 NOTE — Progress Notes (Signed)
ANTIBIOTIC CONSULT NOTE - INITIAL  Pharmacy Consult for Vancomycin and Zosyn Also on Clindamycin  Indication: cellulitis/abscess of buttocks  No Known Allergies  Patient Measurements: Height: 5\' 10"  (177.8 cm) Weight: 192 lb 14.4 oz (87.5 kg) IBW/kg (Calculated) : 73   Vital Signs: Temp: 98.2 F (36.8 C) (10/22 0603) Temp src: Oral (10/22 0603) BP: 87/54 mmHg (10/22 0603) Pulse Rate: 105  (10/22 0603) Intake/Output from previous day: 10/21 0701 - 10/22 0700 In: 1309.2 [P.O.:480; I.V.:529.2; IV Piggyback:300] Out: 350 [Urine:350] Intake/Output from this shift:    Labs:  [facility] 01/23/12 0555 01/22/12 1834  WBC 14.4* 22.0*  HGB 8.8* 10.4*  PLT 227 253  LABCREA -- --  CREATININE 0.89 0.94   Estimated Creatinine Clearance: 98 ml/min (by C-G formula based on Cr of 0.89). No results found for this basename: VANCOTROUGH:2,VANCOPEAK:2,VANCORANDOM:2,GENTTROUGH:2,GENTPEAK:2,GENTRANDOM:2,TOBRATROUGH:2,TOBRAPEAK:2,TOBRARND:2,AMIKACINPEAK:2,AMIKACINTROU:2,AMIKACIN:2, in the last 72 hours   Microbiology: Recent Results (from the past 720 hour(s))  CULTURE, BLOOD (ROUTINE X 2)     Status: Normal (Preliminary result)   Collection Time   01/22/12  7:51 PM      Component Value Range Status Comment   Specimen Description Blood PORTA CATH   Final    Special Requests BOTTLES DRAWN AEROBIC AND ANAEROBIC 8 CC EACH   Final    Culture PENDING   Incomplete    Report Status PENDING   Incomplete    Medical History: Past Medical History  Diagnosis Date  . CAD (coronary artery disease)     a. s/p MI/Cath 02/15/10: thrombectomy and stent in second obtuse marginal ;  b. 10/2011 Cath: patent OM stent, otw nonobs dzs -> med Rx.  Marland Kitchen Hyperlipidemia   . Blood in urine   . Ischemic cardiomyopathy     a. 11/02/2011 Echo: EF 30-35%, sev HK of inf, sept, post walls.  Sev dilated LV, mild MR, mod dil LA.  Marland Kitchen Chronic systolic CHF (congestive heart failure)   . Pulmonary nodule, left     a. CT 11/01/2011  6mm solitary pulmonary nodule ant LUL - f/u CT 6 months (04/2012)  . Cough     a. with pleuritic chest pain 11/2011  . Cancer     a. colon- colonoscopy 7/10 no evidence of recurrent   Medications:  Scheduled:    . antiseptic oral rinse  15 mL Mouth Rinse BID  . aspirin EC  81 mg Oral Daily  . clindamycin (CLEOCIN) IV  600 mg Intravenous Q8H  . docusate sodium  100 mg Oral BID  . pantoprazole  40 mg Oral Daily  . piperacillin-tazobactam (ZOSYN)  IV  3.375 g Intravenous Once  . piperacillin-tazobactam (ZOSYN)  IV  3.375 g Intravenous Once  . piperacillin-tazobactam (ZOSYN)  IV  3.375 g Intravenous Q8H  . potassium chloride  40 mEq Oral Q4H  . saccharomyces boulardii  250 mg Oral BID  . simvastatin  10 mg Oral q1800  . sodium chloride  1,000 mL Intravenous Once  . sodium chloride  1,000 mL Intravenous Once  . sodium chloride  1,000 mL Intravenous Once  . sodium chloride      . sodium chloride      . sodium chloride      . vancomycin  1,000 mg Intravenous Once  . vancomycin  1,000 mg Intravenous Once  . vancomycin  1,000 mg Intravenous Q12H  . DISCONTD: sodium chloride   Intravenous STAT  . DISCONTD: enoxaparin (LOVENOX) injection  40 mg Subcutaneous Q24H   Assessment: 56yo male with h/o of colorectal  cancer c/o pain in right buttock.  Also c/o fever and chills.  CT revealed cavitary lesion in the pelvis.  Goal of Therapy:  Vancomycin trough level 10-15 mcg/ml Eradicate infection.  Plan: Continue Clindamycin per MD Zosyn 3.375gm iv q8hrs Vancomycin 1gm iv q12hrs Check trough at steady state Monitor labs, renal fxn, and cultures per protocol  Valrie Hart A 01/23/2012,7:45 AM

## 2012-01-23 NOTE — Progress Notes (Signed)
Patient refuses to wear nasal cannula for ECO2 monitoring and pulse oximetry monitoring, Dr. Lendell Caprice aware, orders received, Dilaudid PCA to continue without monitoring.

## 2012-01-23 NOTE — Addendum Note (Signed)
Addended by: Marrion Coy L on: 01/23/2012 02:18 PM   Modules accepted: Orders

## 2012-01-23 NOTE — Progress Notes (Signed)
Chart reviewed.  Discussed with Dr. Mariel Sleet. Overnight, patient's blood pressure dropped in the 60s and patient received a bolus of IV saline.  Subjective: Complains of intractable nausea vomiting despite Zofran. Complains of severe pain.  Objective: Vital signs in last 24 hours: Filed Vitals:   01/23/12 0251 01/23/12 0258 01/23/12 0354 01/23/12 0603  BP: 89/52   87/54  Pulse: 108   105  Temp:  101.5 F (38.6 C) 101.2 F (38.4 C) 98.2 F (36.8 C)  TempSrc:    Oral  Resp:    19  Height:      Weight:      SpO2:    100%   Weight change:   Intake/Output Summary (Last 24 hours) at 01/23/12 0942 Last data filed at 01/23/12 0917  Gross per 24 hour  Intake 1319.17 ml  Output    350 ml  Net 969.17 ml   General: Lying on his side with an emesis basin containing paper towels and clear liquid. Appears uncomfortable. Lungs clear to auscultation bilaterally without wheezes rhonchi or rales Cardiovascular regular rate rhythm without murmurs gallops or rubs Abdomen soft nontender, colostomy bag in place Extremities no clubbing cyanosis or edema Rectal: Bilateral induration, fluctuance, erythema and tenderness. Clear greenish fluid coming from the anus with greenish grayish particulate matter.  Lab Results: Basic Metabolic Panel:  Lab 01/23/12 1610 01/22/12 1834  NA 126* 121*  K 3.2* 3.2*  CL 91* 82*  CO2 23 25  GLUCOSE 140* 123*  BUN 14 14  CREATININE 0.89 0.94  CALCIUM 7.3* 8.2*  MG -- --  PHOS -- --   Liver Function Tests:  Lab 01/23/12 0555 01/22/12 1834  AST 9 10  ALT 7 6  ALKPHOS 92 118*  BILITOT 0.8 1.0  PROT 5.5* 7.0  ALBUMIN 1.7* 2.3*   No results found for this basename: LIPASE:2,AMYLASE:2 in the last 168 hours No results found for this basename: AMMONIA:2 in the last 168 hours CBC:  Lab 01/23/12 0555 01/22/12 1834  WBC 14.4* 22.0*  NEUTROABS -- 18.3*  HGB 8.8* 10.4*  HCT 26.3* 31.2*  MCV 79.7 79.6  PLT 227 253   Cardiac Enzymes: No results found  for this basename: CKTOTAL:3,CKMB:3,CKMBINDEX:3,TROPONINI:3 in the last 168 hours BNP: No results found for this basename: PROBNP:3 in the last 168 hours D-Dimer: No results found for this basename: DDIMER:2 in the last 168 hours CBG: No results found for this basename: GLUCAP:6 in the last 168 hours Hemoglobin A1C: No results found for this basename: HGBA1C in the last 168 hours Fasting Lipid Panel: No results found for this basename: CHOL,HDL,LDLCALC,TRIG,CHOLHDL,LDLDIRECT in the last 960 hours Thyroid Function Tests: No results found for this basename: TSH,T4TOTAL,FREET4,T3FREE,THYROIDAB in the last 168 hours Coagulation:  Lab 01/22/12 2327  LABPROT 19.7*  INR 1.73*   Anemia Panel: No results found for this basename: VITAMINB12,FOLATE,FERRITIN,TIBC,IRON,RETICCTPCT in the last 168 hours Urine Drug Screen: Drugs of Abuse  No results found for this basename: labopia, cocainscrnur, labbenz, amphetmu, thcu, labbarb    Alcohol Level: No results found for this basename: ETH:2 in the last 168 hours Urinalysis:  Lab 01/22/12 2211  COLORURINE YELLOW  LABSPEC 1.010  PHURINE 5.5  GLUCOSEU NEGATIVE  HGBUR SMALL*  BILIRUBINUR NEGATIVE  KETONESUR NEGATIVE  PROTEINUR NEGATIVE  UROBILINOGEN 1.0  NITRITE NEGATIVE  LEUKOCYTESUR TRACE*   Micro Results: Recent Results (from the past 240 hour(s))  CULTURE, BLOOD (ROUTINE X 2)     Status: Normal (Preliminary result)   Collection Time   01/22/12  7:51 PM      Component Value Range Status Comment   Specimen Description Blood PORTA CATH   Final    Special Requests BOTTLES DRAWN AEROBIC AND ANAEROBIC 8 CC EACH   Final    Culture PENDING   Incomplete    Report Status PENDING   Incomplete    Studies/Results: Ct Abdomen Pelvis W Contrast  01/22/2012  *RADIOLOGY REPORT*  Clinical Data: Rectal cancer post diverting colostomy and pelvic irradiation, now with gluteal, sacral and coccygeal pain greater on the right, unable to sit  CT ABDOMEN  AND PELVIS WITH CONTRAST  Technique:  Multidetector CT imaging of the abdomen and pelvis was performed following the standard protocol during bolus administration of intravenous contrast. Sagittal and coronal MPR images reconstructed from axial data set.  Contrast: OMNIPAQUE IOHEXOL 300 MG/ML  SOLN Dilute oral contrast.  Comparison: 12/05/2011  Findings: Atelectasis left lung base. Slightly nodular hepatic contours with enlargement of lateral segment left lobe and smaller right lobe suggestive of cirrhosis. Spleen appears mildly enlarged, 60.7 x 11.5 x 10.2 cm. Distended gallbladder without calcification. No focal abnormalities of liver, spleen, kidneys, or adrenal glands. Tiny low attenuation nodule at tail of pancreas 9 mm diameter image 28, unchanged since previous exam as well as an earlier study from 02/18/2010, likely benign. Scattered normal-sized retroperitoneal lymph nodes. Stomach incompletely distended, unable to adequately assess proximal wall thickness. Left lower quadrant sigmoid colostomy.  Large area of abnormal soft tissue in pelvis compatible with combination of known tumor and post radiation therapy change. Extension of infiltration throughout perirectal fat to the pelvic side walls bilaterally, superiorly along the presacral space to the pelvic brim, anteriorly to the urinary bladder, and to small bowel loops in the mid pelvis. Additionally, diffuse infiltration of the tissue planes and subcutaneous fat extends inferiorly from the perirectal soft tissues throughout the buttocks bilaterally. Multiple foci of soft tissue gas are identified within the tumor mass, in the pelvis bilaterally, in the left presacral space superiorly, and within the right buttock. These may represent infection or the sequela of rectal tumor perforation. The only focal measurable gas and fluid collection is in the superior posterior left pelvis, 3.2 x 1.9 x 2.7 cm question small abscess. No definite left buttock gas  identified.  Mild wall thickening of the adjacent small bowel loop in the pelvis may be related to tumor involvement or radiation enteritis. Marked thickening of the bladder wall is identified question related to tumor involvement, bladder inflammation, or radiation therapy. Significant air is present within urinary bladder which can be seen as a result of colovesicle fistula formation or prior catheterization. Multiple normal-sized inguinal lymph nodes bilaterally. Degenerative disc disease changes L5-S1. Mild sclerosis in the right sacrum adjacent to SI joint, nonspecific, could be result of sacroiliitis though metastatic lesion and infection are not excluded; this site is new since 02/18/2010.  IMPRESSION: Large rectal tumor mass with extensive infiltration of surrounding the perirectal and pelvic soft tissue planes compatible with combination of tumor and post radiation therapy. Multiple foci of extraluminal gas are not identified the pelvis, can be seen with infection, rectal mass perforation, or tumor necrosis. Single small gas and fluid collection posterior left pelvis in presacral space 3.2 x 1.9 x 2.7 cm question small abscess. Additional extension of foci of gas into the left buttock highly suspicious for infection. Thickened small bowel loop and pelvis without obstruction question radiation enteritis versus tumor involvement. Bladder wall thickening either from radiation therapy or direct tumor extension  with gas in urinary bladder which can be seen with a fistula or catheterization. Question cirrhosis with splenomegaly. Slight sclerosis within the right sacrum adjacent to the SI joint potentially related to asymmetric sacroiliitis though new since 2011, cannot completely exclude osseous metastasis or less likely infection.  Findings discussed with Dr. Mariel Sleet at time of interpretation 01/22/2012 at 1725 hours.   Original Report Authenticated By: Lollie Marrow, M.D.    Scheduled Meds:   . antiseptic  oral rinse  15 mL Mouth Rinse BID  . aspirin EC  81 mg Oral Daily  . clindamycin (CLEOCIN) IV  600 mg Intravenous Q8H  . docusate sodium  100 mg Oral BID  . pantoprazole  40 mg Oral Daily  . piperacillin-tazobactam (ZOSYN)  IV  3.375 g Intravenous Once  . piperacillin-tazobactam (ZOSYN)  IV  3.375 g Intravenous Once  . piperacillin-tazobactam (ZOSYN)  IV  3.375 g Intravenous Q8H  . potassium chloride  40 mEq Oral Q4H  . saccharomyces boulardii  250 mg Oral BID  . simvastatin  10 mg Oral q1800  . sodium chloride  1,000 mL Intravenous Once  . sodium chloride  1,000 mL Intravenous Once  . sodium chloride  1,000 mL Intravenous Once  . sodium chloride      . sodium chloride      . sodium chloride      . sodium chloride      . vancomycin  1,000 mg Intravenous Once  . vancomycin  1,000 mg Intravenous Once  . vancomycin  1,000 mg Intravenous Q12H  . DISCONTD: sodium chloride   Intravenous STAT  . DISCONTD: enoxaparin (LOVENOX) injection  40 mg Subcutaneous Q24H   Continuous Infusions:   . 0.9 % NaCl with KCl 20 mEq / L 125 mL/hr at 01/23/12 0253   PRN Meds:.acetaminophen, acetaminophen, benzonatate, guaiFENesin-dextromethorphan, HYDROmorphone (DILAUDID) injection, ondansetron (ZOFRAN) IV, oxyCODONE, prochlorperazine, promethazine, traZODone, DISCONTD:  HYDROmorphone (DILAUDID) injection, DISCONTD: ondansetron (ZOFRAN) IV Assessment/Plan: Principal Problem:  *Abscess of male pelvis: Continue broad-spectrum antibiotics. Surgery recommends against drainage of abscess, as complications of fistulas, and spread of tumor cells will be likely. Active Problems:  Recurrent, metastatic rectal cancer: Got chemotherapy last week and cancer has progressed. Discussed with Dr. Laurie Panda. He recommends trying antibiotics for 3-5 days and if the pain and infection has not improved, would be appropriate for hospice and discontinuation of antibiotics and chemotherapy.  Sepsis: Blood cultures pending. Continue IV  hydration and boluses as needed. Patient is DO NOT RESUSCITATE per  Hypotension secondary to septic shock  Hypokalemia: Change to IV repletion due to nausea.  Nausea and vomiting continues despite Zofran. Will try Phenergan. Stop all by mouth meds for now.  Hyponatremia, multifactorial  Anemia: No evidence of active bleeding. Monitor.  Coagulopathy: Not on Coumadin. Likely from sepsis, malnutrition, cancer. Will give vitamin K per  CAD  NICM (nonischemic cardiomyopathy)  Chronic systolic CHF (congestive heart failure): No failure currently.  S/P colostomy  HYPOALBUMINEMIA  Prognosis poor. Patient is in such severe pain that I will start a Dilaudid PCA.   LOS: 1 day   Radhika Dershem L 01/23/2012, 9:42 AM

## 2012-01-23 NOTE — Progress Notes (Signed)
Patient's BP was taken at 0135. It was 64/46 manually. Patient's HR 101. Patient was asymptomatic. Doctor was notified and new orders were given for a bolus. Will continue to monitor patient.

## 2012-01-24 DIAGNOSIS — C20 Malignant neoplasm of rectum: Secondary | ICD-10-CM

## 2012-01-24 DIAGNOSIS — K651 Peritoneal abscess: Secondary | ICD-10-CM

## 2012-01-24 DIAGNOSIS — I428 Other cardiomyopathies: Secondary | ICD-10-CM

## 2012-01-24 LAB — BASIC METABOLIC PANEL
Calcium: 7.9 mg/dL — ABNORMAL LOW (ref 8.4–10.5)
Creatinine, Ser: 0.88 mg/dL (ref 0.50–1.35)
GFR calc Af Amer: >90 mL/min (ref >90)
GFR calc non Af Amer: >90 mL/min (ref >90)

## 2012-01-24 LAB — CBC
MCV: 80.3 fL (ref 78.0–100.0)
Platelets: 318 10*3/uL (ref 150–400)
RDW: 17 % — ABNORMAL HIGH (ref 11.5–15.5)
WBC: 18.9 10*3/uL — ABNORMAL HIGH (ref 4.0–10.5)

## 2012-01-24 LAB — URINE CULTURE

## 2012-01-24 MED ORDER — MORPHINE SULFATE 25 MG/ML IV SOLN
2.0000 mg/h | INTRAVENOUS | Status: DC
  Administered 2012-01-24: 2 mg/h via INTRAVENOUS
  Filled 2012-01-24: qty 10

## 2012-01-24 MED ORDER — MORPHINE SULFATE 4 MG/ML IJ SOLN: 4.0000 mg | INTRAMUSCULAR | Status: DC | PRN

## 2012-01-24 MED ORDER — GUAIFENESIN 100 MG/5ML PO SOLN
200.0000 mg | ORAL | Status: DC | PRN
  Administered 2012-01-24 – 2012-01-25 (×2): 200 mg via ORAL
  Filled 2012-01-24 (×2): qty 10

## 2012-01-24 NOTE — Care Management Note (Signed)
    Page 1 of 1   01/25/2012     2:36:55 PM   CARE MANAGEMENT NOTE 01/25/2012  Patient:  Adam Henderson, Adam Henderson   Account Number:  [id number]  Date Initiated:  01/24/2012  Documentation initiated by:  Rosemary Holms  Subjective/Objective Assessment:   Pt admittd from home where he lives with family. Pt has had colon rectal cancer and now a pelvic mass.     Action/Plan:   Discussion regarding Goals of care   Anticipated DC Date:  01/25/2012   Anticipated DC Plan:  HOME W HOSPICE CARE  In-house referral  Clinical Social Worker      DC Planning Services  CM consult      [facility]   Choice offered to / List presented to:  [name]           Status of service:  Completed, signed off Medicare Important Message given?   (If response is "NO", the following Medicare IM given date fields will be blank) Date Medicare IM given:   Date Additional Medicare IM given:    Discharge Disposition:  HOSPICE MEDICAL FACILITY  Per UR Regulation:    If discussed at Long Length of Stay Meetings, dates discussed:    Comments:  01/25/12 1436 Arlyss Queen, RN BSN CM Pt discharged to [facility] today. CSW will arrange discharge to facility.  01/24/12 Rosemary Holms RN BSN CM

## 2012-01-24 NOTE — Progress Notes (Signed)
Subjective: This man remains in severe pain in the gluteal area. He also has nausea from Dilaudid PCA.           Physical Exam: Blood pressure 83/54, pulse 58, temperature 98.3 F (36.8 C), temperature source Oral, resp. rate 20, height 5\' 10"  (1.778 m), weight 97.1 kg (214 lb 1.1 oz), SpO2 97.00%. He looks sick, cachectic, pale. Heart sounds are present and normal. Lung fields are clear. Abdomen is soft. I did not examine his gluteal area. He is alert currently, orientated, no focal neurological signs.   Investigations:  Recent Results (from the past 240 hour(s))  CULTURE, BLOOD (ROUTINE X 2)     Status: Normal (Preliminary result)   Collection Time   01/22/12  7:07 PM      Component Value Range Status Comment   Specimen Description BLOOD RIGHT ANTECUBITAL   Final    Special Requests     Final    Value: BOTTLES DRAWN AEROBIC AND ANAEROBIC AEB=15CC ANA=12CC   Culture NO GROWTH 1 DAY   Final    Report Status PENDING   Incomplete   CULTURE, BLOOD (ROUTINE X 2)     Status: Normal (Preliminary result)   Collection Time   01/22/12  7:51 PM      Component Value Range Status Comment   Specimen Description Blood PORTA CATH   Final    Special Requests BOTTLES DRAWN AEROBIC AND ANAEROBIC 8 CC EACH   Final    Culture NO GROWTH 1 DAY   Final    Report Status PENDING   Incomplete      Basic Metabolic Panel:  Basename 01/24/12 0505 01/23/12 0555  NA 129* 126*  K 4.2 3.2*  CL 95* 91*  CO2 25 23  GLUCOSE 128* 140*  BUN 16 14  CREATININE 0.88 0.89  CALCIUM 7.9* 7.3*  MG -- 1.5  PHOS -- --   Liver Function Tests:  [facility] 01/23/12 0555 01/22/12 1834  AST 9 10  ALT 7 6  ALKPHOS 92 118*  BILITOT 0.8 1.0  PROT 5.5* 7.0  ALBUMIN 1.7* 2.3*     CBC:  Basename 01/24/12 0505 01/23/12 0555 01/22/12 1834  WBC 18.9* 14.4* --  NEUTROABS -- -- 18.3*  HGB 9.0* 8.8* --  HCT 27.3* 26.3* --  MCV 80.3 79.7 --  PLT 318 227 --    Ct Abdomen Pelvis W  Contrast  01/22/2012  *RADIOLOGY REPORT*  Clinical Data: Rectal cancer post diverting colostomy and pelvic irradiation, now with gluteal, sacral and coccygeal pain greater on the right, unable to sit  CT ABDOMEN AND PELVIS WITH CONTRAST  Technique:  Multidetector CT imaging of the abdomen and pelvis was performed following the standard protocol during bolus administration of intravenous contrast. Sagittal and coronal MPR images reconstructed from axial data set.  Contrast: OMNIPAQUE IOHEXOL 300 MG/ML  SOLN Dilute oral contrast.  Comparison: 12/05/2011  Findings: Atelectasis left lung base. Slightly nodular hepatic contours with enlargement of lateral segment left lobe and smaller right lobe suggestive of cirrhosis. Spleen appears mildly enlarged, 60.7 x 11.5 x 10.2 cm. Distended gallbladder without calcification. No focal abnormalities of liver, spleen, kidneys, or adrenal glands. Tiny low attenuation nodule at tail of pancreas 9 mm diameter image 28, unchanged since previous exam as well as an earlier study from 02/18/2010, likely benign. Scattered normal-sized retroperitoneal lymph nodes. Stomach incompletely distended, unable to adequately assess proximal wall thickness. Left lower quadrant sigmoid colostomy.  Large area of abnormal soft  tissue in pelvis compatible with combination of known tumor and post radiation therapy change. Extension of infiltration throughout perirectal fat to the pelvic side walls bilaterally, superiorly along the presacral space to the pelvic brim, anteriorly to the urinary bladder, and to small bowel loops in the mid pelvis. Additionally, diffuse infiltration of the tissue planes and subcutaneous fat extends inferiorly from the perirectal soft tissues throughout the buttocks bilaterally. Multiple foci of soft tissue gas are identified within the tumor mass, in the pelvis bilaterally, in the left presacral space superiorly, and within the right buttock. These may represent  infection or the sequela of rectal tumor perforation. The only focal measurable gas and fluid collection is in the superior posterior left pelvis, 3.2 x 1.9 x 2.7 cm question small abscess. No definite left buttock gas identified.  Mild wall thickening of the adjacent small bowel loop in the pelvis may be related to tumor involvement or radiation enteritis. Marked thickening of the bladder wall is identified question related to tumor involvement, bladder inflammation, or radiation therapy. Significant air is present within urinary bladder which can be seen as a result of colovesicle fistula formation or prior catheterization. Multiple normal-sized inguinal lymph nodes bilaterally. Degenerative disc disease changes L5-S1. Mild sclerosis in the right sacrum adjacent to SI joint, nonspecific, could be result of sacroiliitis though metastatic lesion and infection are not excluded; this site is new since 02/18/2010.  IMPRESSION: Large rectal tumor mass with extensive infiltration of surrounding the perirectal and pelvic soft tissue planes compatible with combination of tumor and post radiation therapy. Multiple foci of extraluminal gas are not identified the pelvis, can be seen with infection, rectal mass perforation, or tumor necrosis. Single small gas and fluid collection posterior left pelvis in presacral space 3.2 x 1.9 x 2.7 cm question small abscess. Additional extension of foci of gas into the left buttock highly suspicious for infection. Thickened small bowel loop and pelvis without obstruction question radiation enteritis versus tumor involvement. Bladder wall thickening either from radiation therapy or direct tumor extension with gas in urinary bladder which can be seen with a fistula or catheterization. Question cirrhosis with splenomegaly. Slight sclerosis within the right sacrum adjacent to the SI joint potentially related to asymmetric sacroiliitis though new since 2011, cannot completely exclude osseous  metastasis or less likely infection.  Findings discussed with Dr. Mariel Sleet at time of interpretation 01/22/2012 at 1725 hours.   Original Report Authenticated By: Lollie Marrow, M.D.       Medications: I have reviewed the patient's current medications.  Impression: 1. Recurrent metastatic rectal cancer, failed recent chemotherapy. 2. Pelvic abscess. Surgery recommends against range of the abscess. 3. Nonischemic cardiomyopathy, clinically compensated.     Plan: 1. Discontinue IV antibiotics. 2. Start morphine drip. 3. Comfort measures are appropriate at this stage. Prognosis is extremely poor. I discussed this case with oncology, Dr. Mariel Sleet, he agrees with the above recommendations, have also discussed with the patient regarding the plan, he agrees.     LOS: 2 days   Wilson Singer Pager [phone]  01/24/2012, 10:03 AM

## 2012-01-24 NOTE — Progress Notes (Signed)
Placed patient on contact precautions with AM due to many loose stools. Patient is comfort care with recent chemotherapy dose within the last two weeks.

## 2012-01-25 ENCOUNTER — Encounter (HOSPITAL_COMMUNITY): Payer: BC Managed Care – PPO

## 2012-01-25 DIAGNOSIS — C50919 Malignant neoplasm of unspecified site of unspecified female breast: Secondary | ICD-10-CM

## 2012-01-25 DIAGNOSIS — C779 Secondary and unspecified malignant neoplasm of lymph node, unspecified: Secondary | ICD-10-CM

## 2012-01-25 MED ORDER — MORPHINE SULFATE 4 MG/ML IJ SOLN: 4.0000 mg | INTRAMUSCULAR | Status: AC | PRN

## 2012-01-25 MED ORDER — MORPHINE SULFATE 25 MG/ML IV SOLN: 2.0000 mg/h | INTRAVENOUS | Status: AC

## 2012-01-25 NOTE — Progress Notes (Signed)
Pt discharged to hospice. Mother is with patient for support Pt. Is alert and oriented. Pt is hemodynamically stable.Hospice called and given report. EMS discharged patient on stretcher. Discharge plan appropriate and in place. Allena Earing 01/25/2012 4:50 PM

## 2012-01-25 NOTE — Discharge Summary (Signed)
Physician Discharge Summary  Adam Henderson NWG:[id number] DOB: Apr 25, 1955 DOA: 01/22/2012  PCP: [name], MD  Admit date: 01/22/2012 Discharge date: 01/25/2012  Time spent: Greater than 30 minutes   Discharge Diagnoses:   Principal Problem:  *Abscess of male pelvis Active Problems:  HYPOALBUMINEMIA  CAD  Recurrent, metastatic rectal cancer  NICM (nonischemic cardiomyopathy)  Chronic systolic CHF (congestive heart failure)  Sepsis  Hypotension  Nausea and vomiting  Hyponatremia  Anemia  Hypokalemia  Coagulopathy  S/P colostomy   Discharge Condition: Stable.  Diet recommendation: Regular, as tolerated.  Filed Weights   01/22/12 2231 01/24/12 0603 01/25/12 0418  Weight: 87.5 kg (192 lb 14.4 oz) 97.1 kg (214 lb 1.1 oz) 97.2 kg (214 lb 4.6 oz)    History of present illness:  This unfortunate 56 year old man presents to the hospital with symptoms of tenderness of the right buttock. Please see initial history as outlined below: Adam Henderson is an 56 y.o. male. Caucasian gentleman with colorectal cancer, now recurrent with metastases to the pelvis, had a CT abdomen and pelvis done today after he noted this extreme pain and tenderness of the right buttock, making it impossible to sit, associated with fever and chills. CT revealed cavitary lesion in the pelvis, with gas in the subcutaneous tissues; possible abscesses and cellulitis possible necrotic tumor. He was assessed by his oncologist and his condition appreciated to be very grave, admission has been requested for treatment of possible cellulitis/abscess.  Output from his colostomy bag continues to be watery brown. Denies nausea or vomiting.  He is having some urinary frequency and some urinary incontinence, and keeps a towel over his pelvis to soak up urine.  Hospital Course:  Patient was admitted and started empirically on intravenous antibiotics. He was reviewed by Dr. Mariel Sleet, oncology, who really felt that  initially patient should continue antibiotics but as the patient did not really improve significantly and further evaluation of the CT scan revealed infiltration of cancer into the gluteal muscles, Dr. Mariel Sleet felt that his prognosis was extremely poor and that he had a very aggressive malignancy. Dr. Mariel Sleet recommended discontinuation of antibiotics and comfort measures only. His pain was not controlled with Dilantin PCA pump and he had nausea with this. Therefore, he was started on a morphine drip. This is significantly controlled her symptoms, without nausea. He does with his mother, who clearly is unlikely to be able to look after him with the  tumor in the gluteal area. Therefore it was felt appropriate that patient should go to the hospice home for symptom management and probably end-of-life care. The patient was agreeable to this.  Procedures:  None.   Consultations:  Oncology, Dr. Mariel Sleet.  Discharge Exam: Filed Vitals:   01/24/12 0742 01/24/12 2108 01/25/12 0219 01/25/12 0418  BP:  88/57 91/61 89/60   Pulse:  113 106 106  Temp:  98.6 F (37 C) 99.1 F (37.3 C) 98.9 F (37.2 C)  TempSrc:  Oral Oral Oral  Resp: 20 20 19 19   Height:      Weight:    97.2 kg (214 lb 4.6 oz)  SpO2:  98% 98% 98%    General: He looks unwell and chronically sick. Somewhat cachectic. Cardiovascular: Heart sounds are present and normal. No murmurs. Respiratory: Lung fields are clear. Abdomen is soft and nontender. I did not examine his gluteal area. This appears to have ulceration.  Discharge Instructions  Discharge Orders    Future Appointments: Provider: Department: Dept Phone: Center:   01/25/2012  4:00 PM [facility] [phone] None   02/02/2012 11:15 AM Coralyn Helling, MD [facility] [phone] None   02/06/2012 8:45 AM [facility] [phone] None   02/06/2012 12:30 PM Randall An, MD [facility] [phone] None   02/08/2012  3:00 PM [facility] [phone] None   02/20/2012 8:45 AM [facility] [phone] None   02/22/2012 2:00 PM [facility] [phone] None   03/05/2012 8:45 AM [facility] [phone] None   03/07/2012 2:00 PM [facility] [phone] None   03/19/2012 8:45 AM [facility] [phone] None   03/19/2012 11:30 AM Randall An, MD [facility] [phone] None   03/21/2012 2:00 PM [facility] [phone] None   04/09/2012 1:45 PM Lewayne Bunting, MD [facility] [phone] [facility]     Future Orders Please Complete By Expires   Diet - low sodium heart healthy      Increase activity slowly          Medication List     As of 01/25/2012 12:41 PM    STOP taking these medications         aspirin EC 81 MG tablet      AVASTIN IV      dexamethasone 4 MG tablet   Commonly known as: DECADRON      dextrose 5 % SOLN 500 mL with irinotecan 100 MG/5ML SOLN      HYDROcodone-acetaminophen 5-325 MG per tablet   Commonly known as: NORCO/VICODIN      leucovorin 10 MG/ML chemo injection   Commonly known as: WELLCOVORIN      lidocaine-prilocaine cream   Commonly known as: EMLA      pravastatin 20 MG tablet   Commonly known as: PRAVACHOL      sodium chloride 0.9 % SOLN 150 mL with fluorouracil CALGB 73710 5 GM/100ML SOLN      VITAMIN B-12 PO      vitamin C 500 MG tablet   Commonly known as: ASCORBIC ACID      TAKE these medications         benzonatate 100 MG capsule   Commonly known as: TESSALON   Take 100 mg by mouth 3 (three) times daily as needed. For cough.      dextrose 5 % SOLN 250 mL with morphine 25 MG/ML SOLN 250 mg   Inject 2 mg/hr into the vein continuous.      guaiFENesin-dextromethorphan 100-10 MG/5ML syrup   Commonly known as: ROBITUSSIN DM   Take 5 mLs by mouth every 6 (six) hours as needed  for cough.      morphine 4 MG/ML injection   Inject 1 mL (4 mg total) into the vein every 2 (two) hours as needed for severe pain.      ondansetron 8 MG tablet   Commonly known as: ZOFRAN   Take 8 mg by mouth. Starting the day after chemo, take 1 tablet in the am and 1 tablet in the pm for 2 days. Then may take 1 tablet two times a day IF needed for nausea/vomiting. (side effect of this medication is constipation)      pantoprazole 40 MG tablet   Commonly known as: PROTONIX   Take 40 mg by mouth daily.      prochlorperazine 10 MG tablet   Commonly known as: COMPAZINE   Take 1 tablet (  10 mg total) by mouth every 6 (six) hours as needed. nausea      Tamsulosin HCl 0.4 MG Caps   Commonly known as: FLOMAX   Take 0.4 mg by mouth every morning.          The results of significant diagnostics from this hospitalization (including imaging, microbiology, ancillary and laboratory) are listed below for reference.    Significant Diagnostic Studies: Dg Chest 2 View  01/11/2012  *RADIOLOGY REPORT*  Clinical Data: Follow up pleural effusion  CHEST - 2 VIEW  Comparison: 12/28/2011  Findings: Slight increase in left pleural effusion which remains relatively small in size.  Mild left lower lobe airspace disease is present which is probably atelectasis but could be pneumonia. Right lung is clear.  Cardiac enlargement with mild vascular congestion.  No significant edema.  Port-A-Cath tip in the SVC appear  IMPRESSION: Slight increase in left effusion.  Mild vascular congestion without edema.   Original Report Authenticated By: Camelia Phenes, M.D.    Dg Chest 2 View  12/28/2011  *RADIOLOGY REPORT*  Clinical Data: Follow up chest tube removal  CHEST - 2 VIEW  Comparison: 12/27/2011  Findings:  There is a right chest wall porta-catheter with tip in the cavoatrial junction.  Interval removal of left chest tube.  There is persistent blunting of the left costophrenic angle consistent with effusion and/or  pleural parenchymal scarring.  No airspace consolidation or interstitial edema.  IMPRESSION:  1.  No pneumothorax after chest tube removal.   Original Report Authenticated By: Rosealee Albee, M.D.    Dg Chest 2 View  12/27/2011  *RADIOLOGY REPORT*  Clinical Data: Pleural drain.  Chest tube.  Effusion.  CHEST - 2 VIEW  Comparison: 12/26/2011  Findings: Right Port-A-Cath and left chest tube remain in place, unchanged.  Scarring in the left lung base, stable.  Possible small residual left effusion, stable.  Right lung is clear.  Heart is borderline in size. No pneumothorax.  IMPRESSION: No interval change.   Original Report Authenticated By: Cyndie Chime, M.D.    Ct Abdomen Pelvis W Contrast  01/22/2012  *RADIOLOGY REPORT*  Clinical Data: Rectal cancer post diverting colostomy and pelvic irradiation, now with gluteal, sacral and coccygeal pain greater on the right, unable to sit  CT ABDOMEN AND PELVIS WITH CONTRAST  Technique:  Multidetector CT imaging of the abdomen and pelvis was performed following the standard protocol during bolus administration of intravenous contrast. Sagittal and coronal MPR images reconstructed from axial data set.  Contrast: OMNIPAQUE IOHEXOL 300 MG/ML  SOLN Dilute oral contrast.  Comparison: 12/05/2011  Findings: Atelectasis left lung base. Slightly nodular hepatic contours with enlargement of lateral segment left lobe and smaller right lobe suggestive of cirrhosis. Spleen appears mildly enlarged, 60.7 x 11.5 x 10.2 cm. Distended gallbladder without calcification. No focal abnormalities of liver, spleen, kidneys, or adrenal glands. Tiny low attenuation nodule at tail of pancreas 9 mm diameter image 28, unchanged since previous exam as well as an earlier study from 02/18/2010, likely benign. Scattered normal-sized retroperitoneal lymph nodes. Stomach incompletely distended, unable to adequately assess proximal wall thickness. Left lower quadrant sigmoid colostomy.  Large area  of abnormal soft tissue in pelvis compatible with combination of known tumor and post radiation therapy change. Extension of infiltration throughout perirectal fat to the pelvic side walls bilaterally, superiorly along the presacral space to the pelvic brim, anteriorly to the urinary bladder, and to small bowel loops in the mid pelvis. Additionally, diffuse infiltration  of the tissue planes and subcutaneous fat extends inferiorly from the perirectal soft tissues throughout the buttocks bilaterally. Multiple foci of soft tissue gas are identified within the tumor mass, in the pelvis bilaterally, in the left presacral space superiorly, and within the right buttock. These may represent infection or the sequela of rectal tumor perforation. The only focal measurable gas and fluid collection is in the superior posterior left pelvis, 3.2 x 1.9 x 2.7 cm question small abscess. No definite left buttock gas identified.  Mild wall thickening of the adjacent small bowel loop in the pelvis may be related to tumor involvement or radiation enteritis. Marked thickening of the bladder wall is identified question related to tumor involvement, bladder inflammation, or radiation therapy. Significant air is present within urinary bladder which can be seen as a result of colovesicle fistula formation or prior catheterization. Multiple normal-sized inguinal lymph nodes bilaterally. Degenerative disc disease changes L5-S1. Mild sclerosis in the right sacrum adjacent to SI joint, nonspecific, could be result of sacroiliitis though metastatic lesion and infection are not excluded; this site is new since 02/18/2010.  IMPRESSION: Large rectal tumor mass with extensive infiltration of surrounding the perirectal and pelvic soft tissue planes compatible with combination of tumor and post radiation therapy. Multiple foci of extraluminal gas are not identified the pelvis, can be seen with infection, rectal mass perforation, or tumor necrosis.  Single small gas and fluid collection posterior left pelvis in presacral space 3.2 x 1.9 x 2.7 cm question small abscess. Additional extension of foci of gas into the left buttock highly suspicious for infection. Thickened small bowel loop and pelvis without obstruction question radiation enteritis versus tumor involvement. Bladder wall thickening either from radiation therapy or direct tumor extension with gas in urinary bladder which can be seen with a fistula or catheterization. Question cirrhosis with splenomegaly. Slight sclerosis within the right sacrum adjacent to the SI joint potentially related to asymmetric sacroiliitis though new since 2011, cannot completely exclude osseous metastasis or less likely infection.  Findings discussed with Dr. Mariel Sleet at time of interpretation 01/22/2012 at 1725 hours.   Original Report Authenticated By: Lollie Marrow, M.D.     Microbiology: Recent Results (from the past 240 hour(s))  CULTURE, BLOOD (ROUTINE X 2)     Status: Normal (Preliminary result)   Collection Time   01/22/12  7:07 PM      Component Value Range Status Comment   Specimen Description BLOOD RIGHT ANTECUBITAL   Final    Special Requests     Final    Value: BOTTLES DRAWN AEROBIC AND ANAEROBIC AEB=15CC ANA=12CC   Culture NO GROWTH 3 DAYS   Final    Report Status PENDING   Incomplete   CULTURE, BLOOD (ROUTINE X 2)     Status: Normal (Preliminary result)   Collection Time   01/22/12  7:51 PM      Component Value Range Status Comment   Specimen Description BLOOD PORTA CATH DRAWN BY RN   Final    Special Requests BOTTLES DRAWN AEROBIC AND ANAEROBIC 8 CC EACH   Final    Culture NO GROWTH 3 DAYS   Final    Report Status PENDING   Incomplete   URINE CULTURE     Status: Normal   Collection Time   01/22/12 10:11 PM      Component Value Range Status Comment   Specimen Description URINE, CLEAN CATCH   Final    Special Requests NONE   Final    Culture  Setup Time 01/23/2012 15:59   Final     Colony Count 25,000 COLONIES/ML   Final    Culture     Final    Value: Multiple bacterial morphotypes present, none predominant. Suggest appropriate recollection if clinically indicated.   Report Status 01/24/2012 FINAL   Final      Labs: Basic Metabolic Panel:  Lab 01/24/12 7829 01/23/12 0555 01/22/12 1834  NA 129* 126* 121*  K 4.2 3.2* 3.2*  CL 95* 91* 82*  CO2 25 23 25   GLUCOSE 128* 140* 123*  BUN 16 14 14   CREATININE 0.88 0.89 0.94  CALCIUM 7.9* 7.3* 8.2*  MG -- 1.5 --  PHOS -- -- --   Liver Function Tests:  Lab 01/23/12 0555 01/22/12 1834  AST 9 10  ALT 7 6  ALKPHOS 92 118*  BILITOT 0.8 1.0  PROT 5.5* 7.0  ALBUMIN 1.7* 2.3*     CBC:  Lab 01/24/12 0505 01/23/12 0555 01/22/12 1834  WBC 18.9* 14.4* 22.0*  NEUTROABS -- -- 18.3*  HGB 9.0* 8.8* 10.4*  HCT 27.3* 26.3* 31.2*  MCV 80.3 79.7 79.6  PLT 318 227 253   )  Basename 11/16/11 1150 11/01/11 1532  PROBNP 229.0* 1658.0*   CBG:      Signed:  GOSRANI,NIMISH C  [facility] 01/25/2012, 12:41 PM

## 2012-01-25 NOTE — Progress Notes (Signed)
Subjective: This man his pain is much better controlled with morphine drip, currently at 3 mg per hour. He still awake, able to eat his breakfast etc.           Physical Exam: Blood pressure 89/60, pulse 106, temperature 98.9 F (37.2 C), temperature source Oral, resp. rate 19, height 5\' 10"  (1.778 m), weight 97.2 kg (214 lb 4.6 oz), SpO2 98.00%. He looks sick, cachectic, pale. Heart sounds are present and normal. Lung fields are clear. Abdomen is soft. I did not examine his gluteal area. He is alert currently, orientated, no focal neurological signs.   Investigations:  Recent Results (from the past 240 hour(s))  CULTURE, BLOOD (ROUTINE X 2)     Status: Normal (Preliminary result)   Collection Time   01/22/12  7:07 PM      Component Value Range Status Comment   Specimen Description BLOOD RIGHT ANTECUBITAL   Final    Special Requests     Final    Value: BOTTLES DRAWN AEROBIC AND ANAEROBIC AEB=15CC ANA=12CC   Culture NO GROWTH 2 DAYS   Final    Report Status PENDING   Incomplete   CULTURE, BLOOD (ROUTINE X 2)     Status: Normal (Preliminary result)   Collection Time   01/22/12  7:51 PM      Component Value Range Status Comment   Specimen Description BLOOD PORTA CATH DRAWN BY RN   Final    Special Requests BOTTLES DRAWN AEROBIC AND ANAEROBIC 8 CC EACH   Final    Culture NO GROWTH 2 DAYS   Final    Report Status PENDING   Incomplete   URINE CULTURE     Status: Normal   Collection Time   01/22/12 10:11 PM      Component Value Range Status Comment   Specimen Description URINE, CLEAN CATCH   Final    Special Requests NONE   Final    Culture  Setup Time 01/23/2012 15:59   Final    Colony Count 25,000 COLONIES/ML   Final    Culture     Final    Value: Multiple bacterial morphotypes present, none predominant. Suggest appropriate recollection if clinically indicated.   Report Status 01/24/2012 FINAL   Final      Basic Metabolic Panel:  Basename 01/24/12 0505 01/23/12  0555  NA 129* 126*  K 4.2 3.2*  CL 95* 91*  CO2 25 23  GLUCOSE 128* 140*  BUN 16 14  CREATININE 0.88 0.89  CALCIUM 7.9* 7.3*  MG -- 1.5  PHOS -- --   Liver Function Tests:  [facility] 01/23/12 0555 01/22/12 1834  AST 9 10  ALT 7 6  ALKPHOS 92 118*  BILITOT 0.8 1.0  PROT 5.5* 7.0  ALBUMIN 1.7* 2.3*     CBC:  Basename 01/24/12 0505 01/23/12 0555 01/22/12 1834  WBC 18.9* 14.4* --  NEUTROABS -- -- 18.3*  HGB 9.0* 8.8* --  HCT 27.3* 26.3* --  MCV 80.3 79.7 --  PLT 318 227 --    No results found.    Medications: I have reviewed the patient's current medications.  Impression: 1. Recurrent metastatic rectal cancer, failed recent chemotherapy. 2. Pelvic abscess. Surgery recommends against range of the abscess. 3. Nonischemic cardiomyopathy, clinically compensated.     Plan: 1. Continue with morphine drip. 2. I think is an excellent candidate for a hospice home for symptom management and  end-of-life care. I think he will not be able to  manage at home where the only caregiver appears to be his mother. We will try and make arrangements for him to go to hospice home, he is agreeable to this.     LOS: 3 days   Wilson Singer Pager [phone]  01/25/2012, 9:01 AM

## 2012-01-25 NOTE — Clinical Social Work Note (Signed)
Pt d/c today to [facility] as pt is agreeable to hospice care. He will transfer via [facility] later this afternoon. D/C summary faxed. Pt will complete paperwork at facility with assistance from his mother.   Derenda Fennel, [place] [phone]

## 2012-01-25 NOTE — Progress Notes (Signed)
Brief Nutrition Note  Chart reviewed. Pt now transitioning to comfort care.  No further nutrition interventions warranted at this time.  Please re-consult as needed.   Amylynn Fano A. Kayan, RD, LDN Pager: [phone]  

## 2012-01-25 NOTE — Progress Notes (Signed)
Subjective: Patient sen in bed.  He had a BM and the nyursing staff (3) were rolling him to clean the patient's bottom.    The patient's mother was at the bedside.  I spoke with her while the nurses were cleaning Mr. Kleintop.  She denies any questions or concerns.  She reports that Toniann Fail has been comfortable since the morphine drip was initiated.  He is planned to go to the Hospice home today and that is where he needs to be.  I spoke with Toniann Fail after the nurses finished and he is at peace with the situation.  "I am going to a better place."  He reports that his pain is increased since being manipulated.  Nurses are giving him an extra mg of morphine.  He denies any fears.     Objective: Vital signs in last 24 hours: Temp:  [98.6 F (37 C)-99.1 F (37.3 C)] 98.9 F (37.2 C) (10/24 0418) Pulse Rate:  [106-113] 106  (10/24 0418) Resp:  [19-20] 19  (10/24 0418) BP: (88-91)/(57-61) 89/60 mmHg (10/24 0418) SpO2:  [98 %] 98 % (10/24 0418) Weight:  [214 lb 4.6 oz (97.2 kg)] 214 lb 4.6 oz (97.2 kg) (10/24 0418)  Intake/Output from previous day: 10/23 0800 - 10/24 0759 In: 240 [P.O.:240] Out: 650 [Urine:650] Intake/Output this shift:    General appearance: alert, cooperative, appears older than stated age, mild distress and pale and signs of LE modeling  Lab Results:   [facility] 01/24/12 0505 01/23/12 0555  WBC 18.9* 14.4*  HGB 9.0* 8.8*  HCT 27.3* 26.3*  PLT 318 227   BMET  Basename 01/24/12 0505 01/23/12 0555  NA 129* 126*  K 4.2 3.2*  CL 95* 91*  CO2 25 23  GLUCOSE 128* 140*  BUN 16 14  CREATININE 0.88 0.89  CALCIUM 7.9* 7.3*    Studies/Results: No results found.  Medications: I have reviewed the patient's current medications.  Assessment/Plan: Recurrent rectal adenocarcinoma with extensive local regional disease now with probable invasion into the soft tissues of the perirectal space with infection, gas within the soft tissues, thickening of the skin of the buttocks,  erythema of the same areas and tremendous tenderness in these areas more right-sided and left-sided. CAT scan shows gas within the tissues as mentioned above extensive stranding of the tissues in the gluteal areas.  He is on morphine drip which is controlling his pain. He is being discharged today to the Hospice home.    LOS: 3 days    KEFALAS,THOMAS 01/25/2012

## 2012-01-25 NOTE — Clinical Social Work Psychosocial (Signed)
Clinical Social Work Department BRIEF PSYCHOSOCIAL ASSESSMENT 01/25/2012  Patient:  Adam Henderson, Adam Henderson     Account Number:  [id number]     Admit date:  01/22/2012  Clinical Social Worker:  Nancie Neas  Date/Time:  01/25/2012 11:40 AM  Referred by:  Physician  Date Referred:  01/25/2012 Referred for  Residential hospice placement   Other Referral:   Interview type:  Patient Other interview type:    PSYCHOSOCIAL DATA Living Status:  FAMILY Admitted from facility:   Level of care:   Primary support name:  Adam Henderson Primary support relationship to patient:  PARENT Degree of support available:   very supportive per pt    CURRENT CONCERNS Current Concerns  Post-Acute Placement   Other Concerns:    SOCIAL WORK ASSESSMENT / PLAN CSW met with pt at bedside following referral from MD for Hospice Home consult. Pt alert and oriented. He provided 2 year history to CSW since his diagnosis of cancer. Pt states he lives with his mother who is his best support. Pt was talking to his mother on phone and they appear to have a good relationship. 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 He reports his step father died in 07-Dec-2022 of this year. Pt began treatment but became very sick and weak and has decided to stop treatment. MD discussed prognosis and goals of care and pt is agreeable to meet with hospice.   Assessment/plan status:  Psychosocial Support/Ongoing Assessment of Needs Other assessment/ plan:   Information/referral to community resources:   Hospice    PATIENT'S/FAMILY'S RESPONSE TO PLAN OF CARE: Referral made to hospice and pt requesting Hospice Home as his mother is not going to be able to care for him. Support provided to pt. CSW will follow up later today.        Derenda Fennel, [place] [phone]

## 2012-01-27 LAB — CULTURE, BLOOD (ROUTINE X 2): Culture: NO GROWTH

## 2012-01-31 LAB — AFB CULTURE WITH SMEAR (NOT AT ARMC)

## 2012-02-02 ENCOUNTER — Ambulatory Visit: Payer: BC Managed Care – PPO | Admitting: Pulmonary Disease

## 2012-02-06 ENCOUNTER — Inpatient Hospital Stay (HOSPITAL_COMMUNITY): Payer: BC Managed Care – PPO

## 2012-02-06 ENCOUNTER — Ambulatory Visit (HOSPITAL_COMMUNITY): Payer: BC Managed Care – PPO | Admitting: Oncology

## 2012-02-08 ENCOUNTER — Encounter (HOSPITAL_COMMUNITY): Payer: BC Managed Care – PPO

## 2012-02-20 ENCOUNTER — Inpatient Hospital Stay (HOSPITAL_COMMUNITY): Payer: BC Managed Care – PPO

## 2012-02-22 ENCOUNTER — Encounter (HOSPITAL_COMMUNITY): Payer: BC Managed Care – PPO

## 2012-03-05 ENCOUNTER — Inpatient Hospital Stay (HOSPITAL_COMMUNITY): Payer: BC Managed Care – PPO

## 2012-03-07 ENCOUNTER — Encounter (HOSPITAL_COMMUNITY): Payer: BC Managed Care – PPO

## 2012-03-19 ENCOUNTER — Inpatient Hospital Stay (HOSPITAL_COMMUNITY): Payer: BC Managed Care – PPO

## 2012-03-19 ENCOUNTER — Ambulatory Visit (HOSPITAL_COMMUNITY): Payer: BC Managed Care – PPO | Admitting: Oncology

## 2012-03-21 ENCOUNTER — Encounter (HOSPITAL_COMMUNITY): Payer: BC Managed Care – PPO

## 2012-04-03 DEATH — deceased

## 2012-04-09 ENCOUNTER — Ambulatory Visit: Payer: BC Managed Care – PPO | Admitting: Cardiology

## 2012-05-01 ENCOUNTER — Encounter (INDEPENDENT_AMBULATORY_CARE_PROVIDER_SITE_OTHER): Payer: Self-pay

## 2012-09-02 NOTE — Telephone Encounter (Signed)
error 

## 2013-11-25 ENCOUNTER — Other Ambulatory Visit: Payer: Self-pay | Admitting: Pharmacist

## 2014-03-12 ENCOUNTER — Encounter (HOSPITAL_COMMUNITY): Payer: Self-pay | Admitting: Cardiovascular Disease

## 2014-07-22 IMAGING — CR DG CHEST 2V
2 series · 2 of 2 positions shown · non-contrast
Comparison: 11/29/2011

CLINICAL DATA: Short of breath and pneumonia

CHEST - 2 VIEW

[w chest pa]
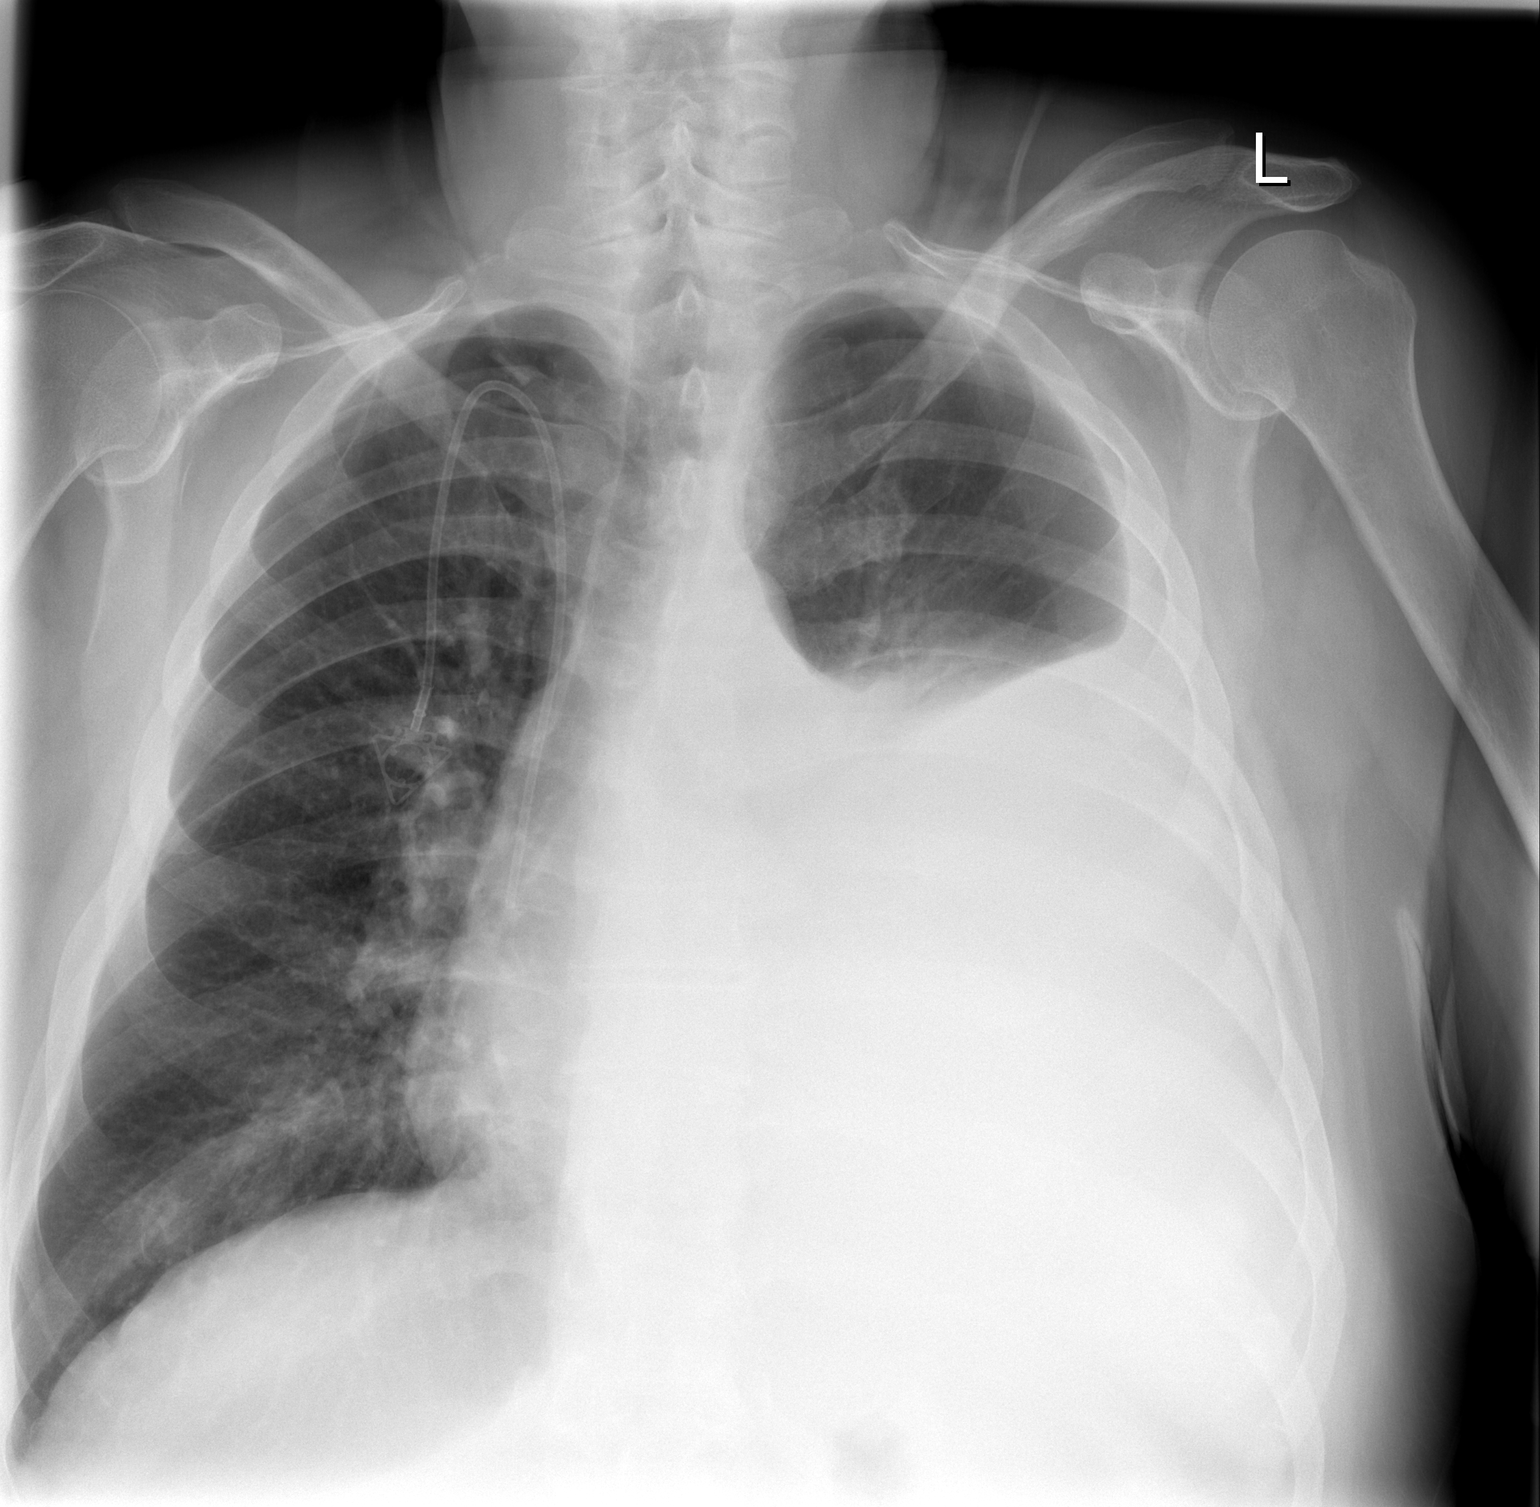

[w chest lat]
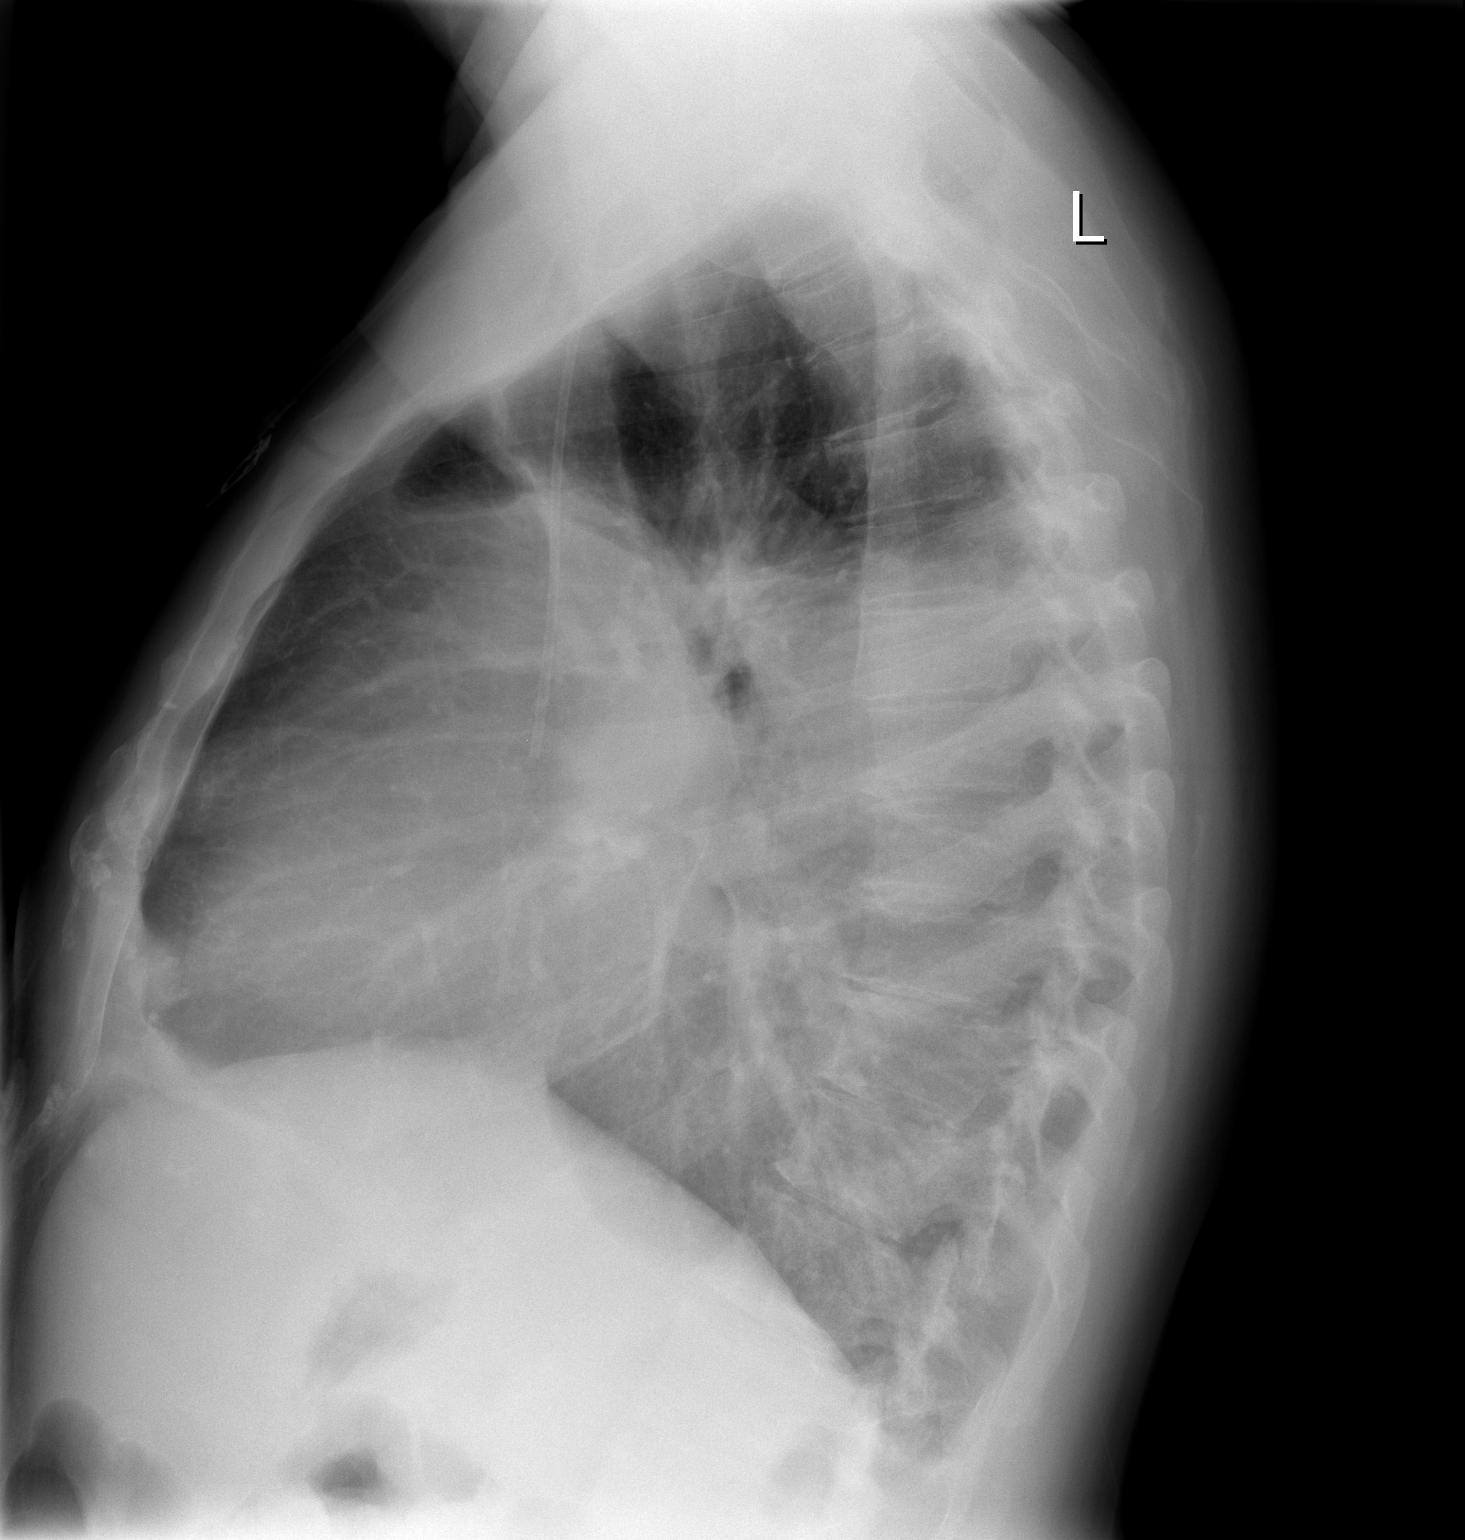

[2 of 2 positions shown; findings below may reference images not displayed]

FINDINGS: Large left effusion has progressed from the  prior study.
There is collapse of the left lower lobe.  Right lung is clear.
Port-A-Cath tip remains in the lower SVC.  No heart failure.
IMPRESSION: Large left effusion has progressed from the prior study.  There is
collapse of the left lower lobe.

Thoracentesis is suggested to rule out underlying malignancy or
infection.

## 2014-07-22 IMAGING — CT CT ANGIO CHEST
2 of 6 series · 19 of 46 positions shown · IV contrast (APPLIED)
Comparison: PA and lateral chest earlier this same day and CT chest
11/02/2011.

CLINICAL DATA: Chest pain and shortness of breath.  History of
colon cancer.

CT ANGIOGRAPHY CHEST
TECHNIQUE: Multidetector CT imaging of the chest using the
standard protocol during bolus administration of intravenous
contrast. Multiplanar reconstructed images including MIPs were
obtained and reviewed to evaluate the vascular anatomy.
Contrast: 100mL OMNIPAQUE IOHEXOL 350 MG/ML SOLN

[Series 6: pulm embolism 1.0 b25f thin · axial · 0.74mm/px · z∈[+1188,+1461]mm · 16 of 301 slices shown]
[im 14/301  lung]
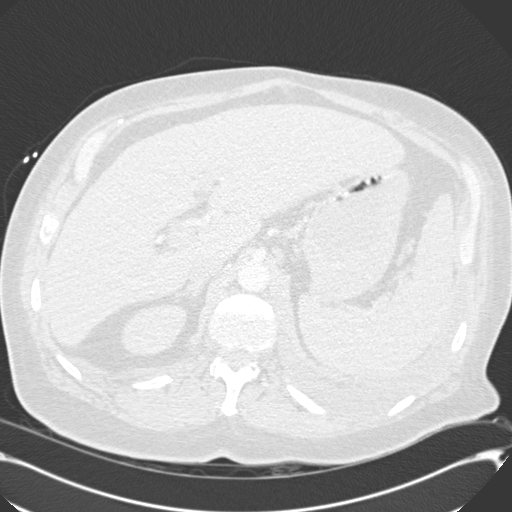
[im 40/301  soft-tissue]
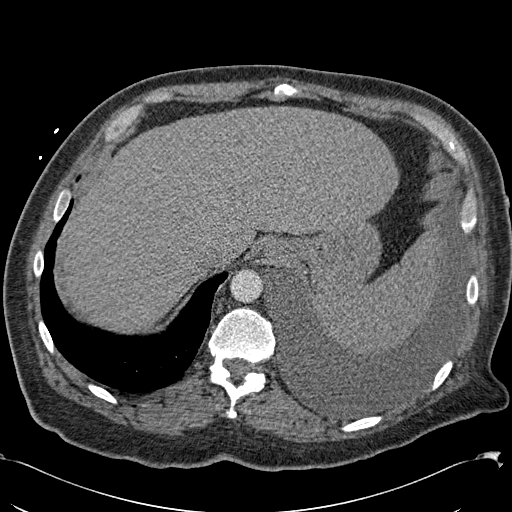
[im 53/301  lung]
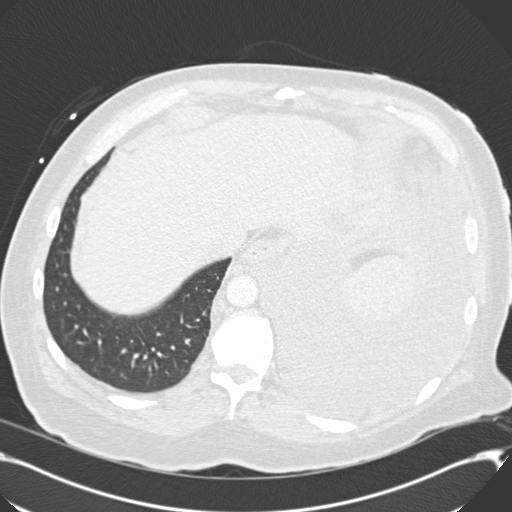
[im 66/301  soft-tissue]
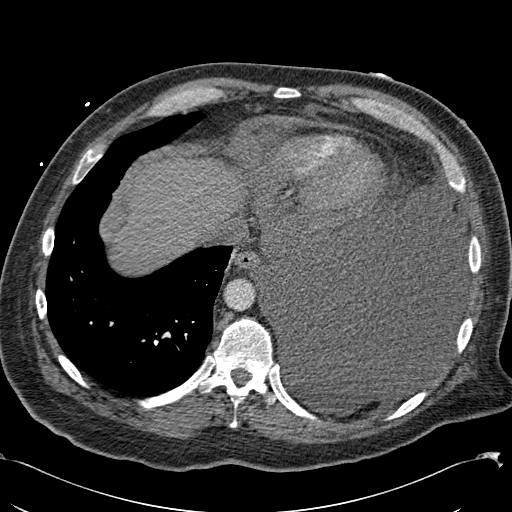
[im 92/301  lung]
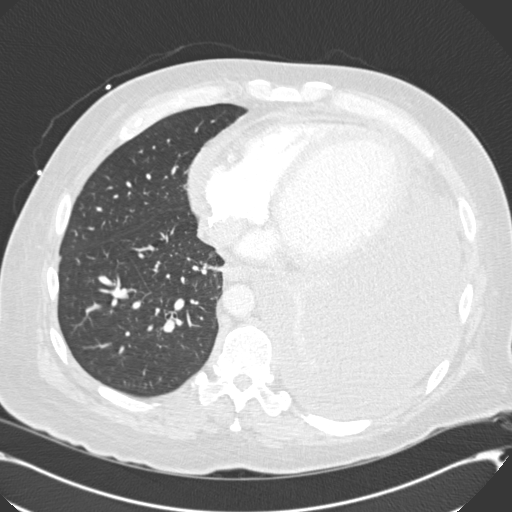
[im 105/301  soft-tissue]
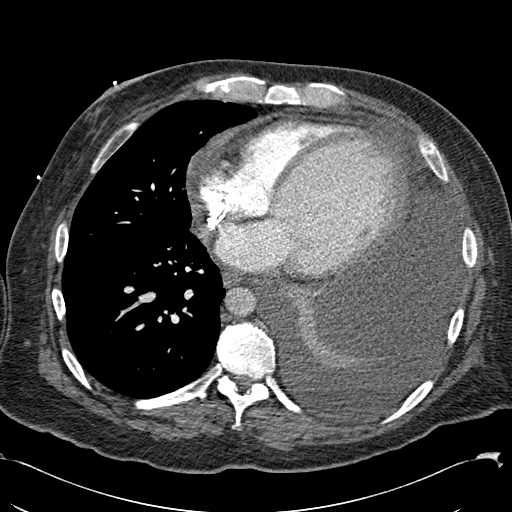
[im 118/301  lung]
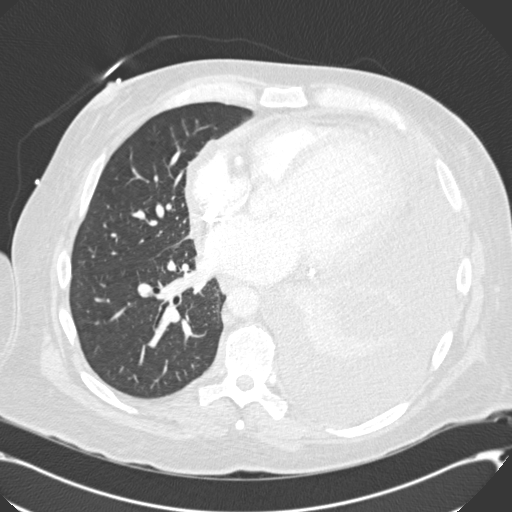
[im 144/301  soft-tissue]
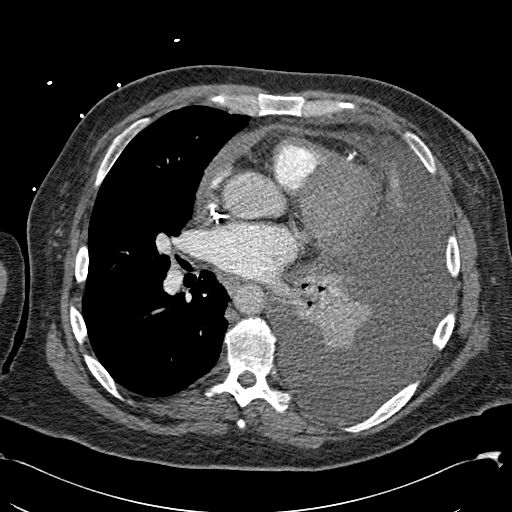
[im 157/301  lung]
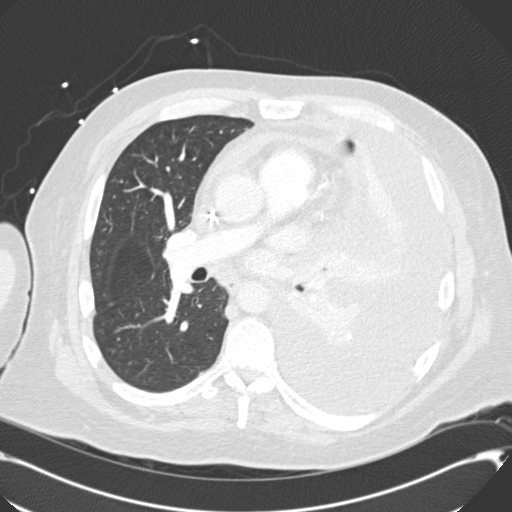
[im 183/301  soft-tissue]
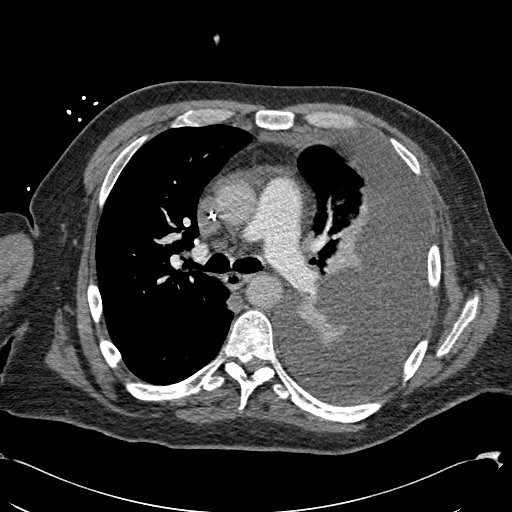
[im 196/301  lung]
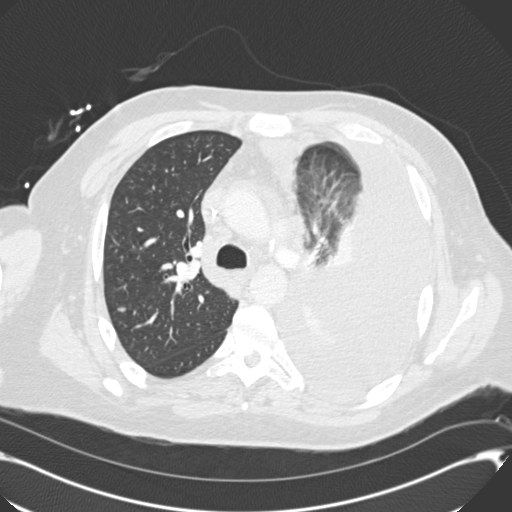
[im 209/301  soft-tissue]
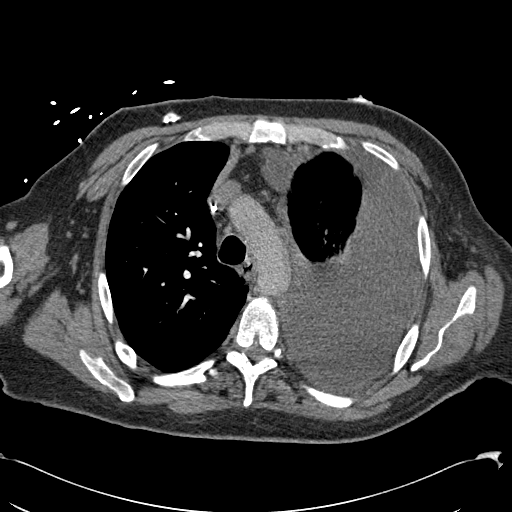
[im 235/301  lung]
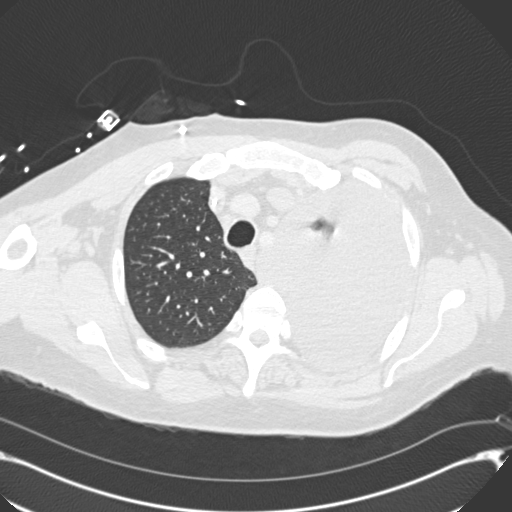
[im 248/301  soft-tissue]
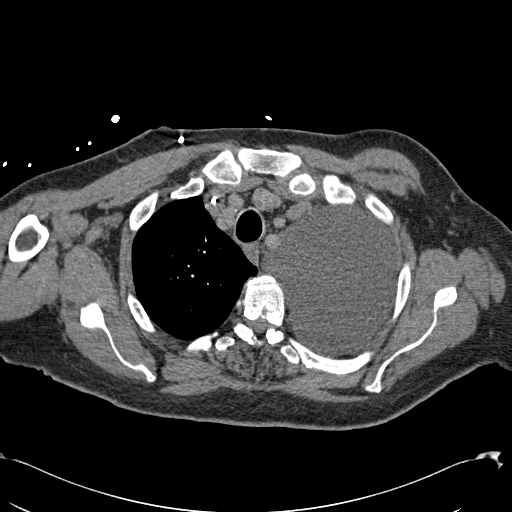
[im 261/301  lung]
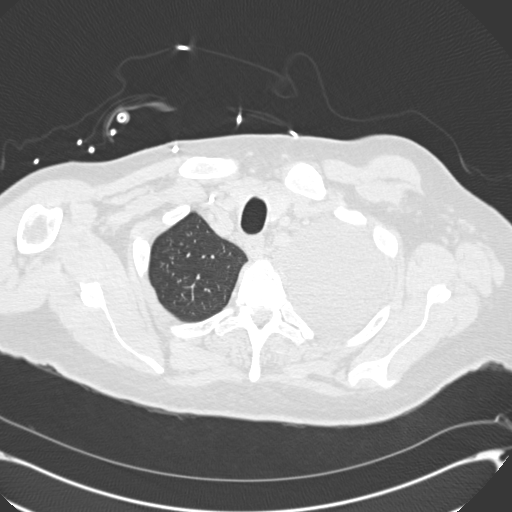
[im 287/301  soft-tissue]
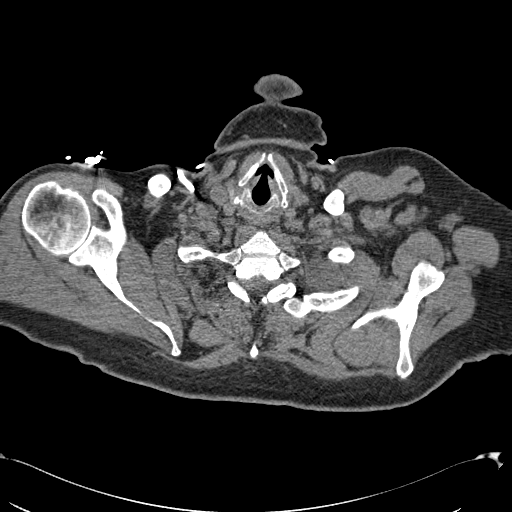

[Series 8: coronals · coronal · 0.70mm/px · 3 of 149 slices shown]
[im 38/149  soft-tissue]
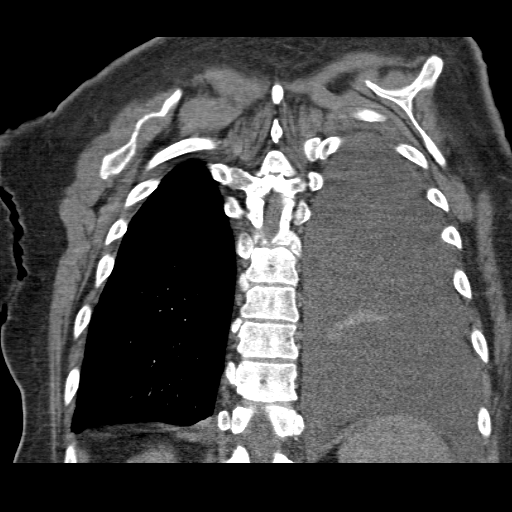
[im 75/149  soft-tissue]
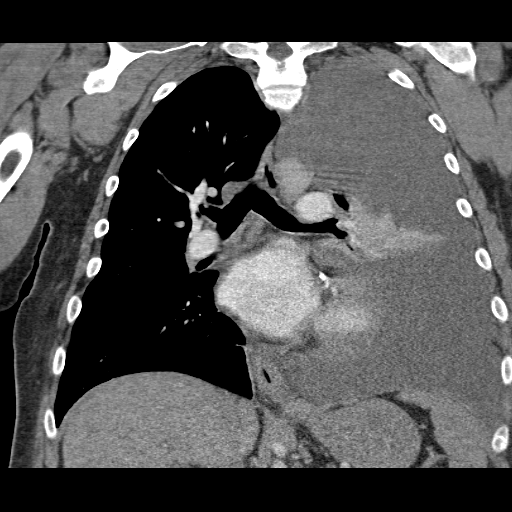
[im 112/149  soft-tissue]
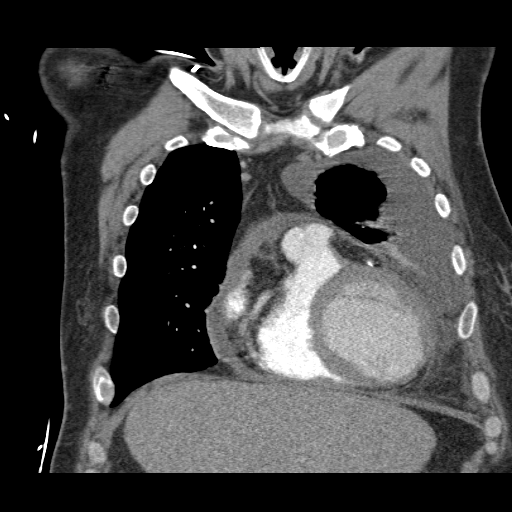

[19 of 46 positions shown; findings below may reference images not displayed]

FINDINGS: No pulmonary embolus is identified.  Since the prior CT
scan, the small left pleural effusion seen on the prior study has
increased in size and now nearly fills the left chest.  Trace
amount of pleural fluid seen on the prior study has resolved.
There is a small pericardial effusion, increased in size since the
prior study.  Cardiomegaly is noted.  There are some small
mediastinal lymph nodes but no pathologic lymphadenopathy by CT
size criteria in the axilla, hila or mediastinum is identified.
Coronary atherosclerotic vascular disease is noted.  The lungs
demonstrate mark compressive atelectasis of the left lung.  The
right lung is clear.  Incidentally imaged upper abdomen is
unremarkable.  No focal bony abnormality is identified.
IMPRESSION: 1.  Negative for pulmonary embolus.
2.  Marked increase in a left pleural effusion which now nearly
fills the left chest.  Associated compressive atelectasis of the
left lung is seen.
3.  New very small pericardial effusion.
4.  Calcific coronary artery disease.  Cardiomegaly also noted.

## 2014-07-23 IMAGING — CR DG CHEST 1V PORT
1 series · 1 of 1 positions shown · non-contrast
Comparison: 12/18/2011 CT

CLINICAL DATA: Left thoracentesis follow-up.

PORTABLE CHEST - 1 VIEW

[AP]
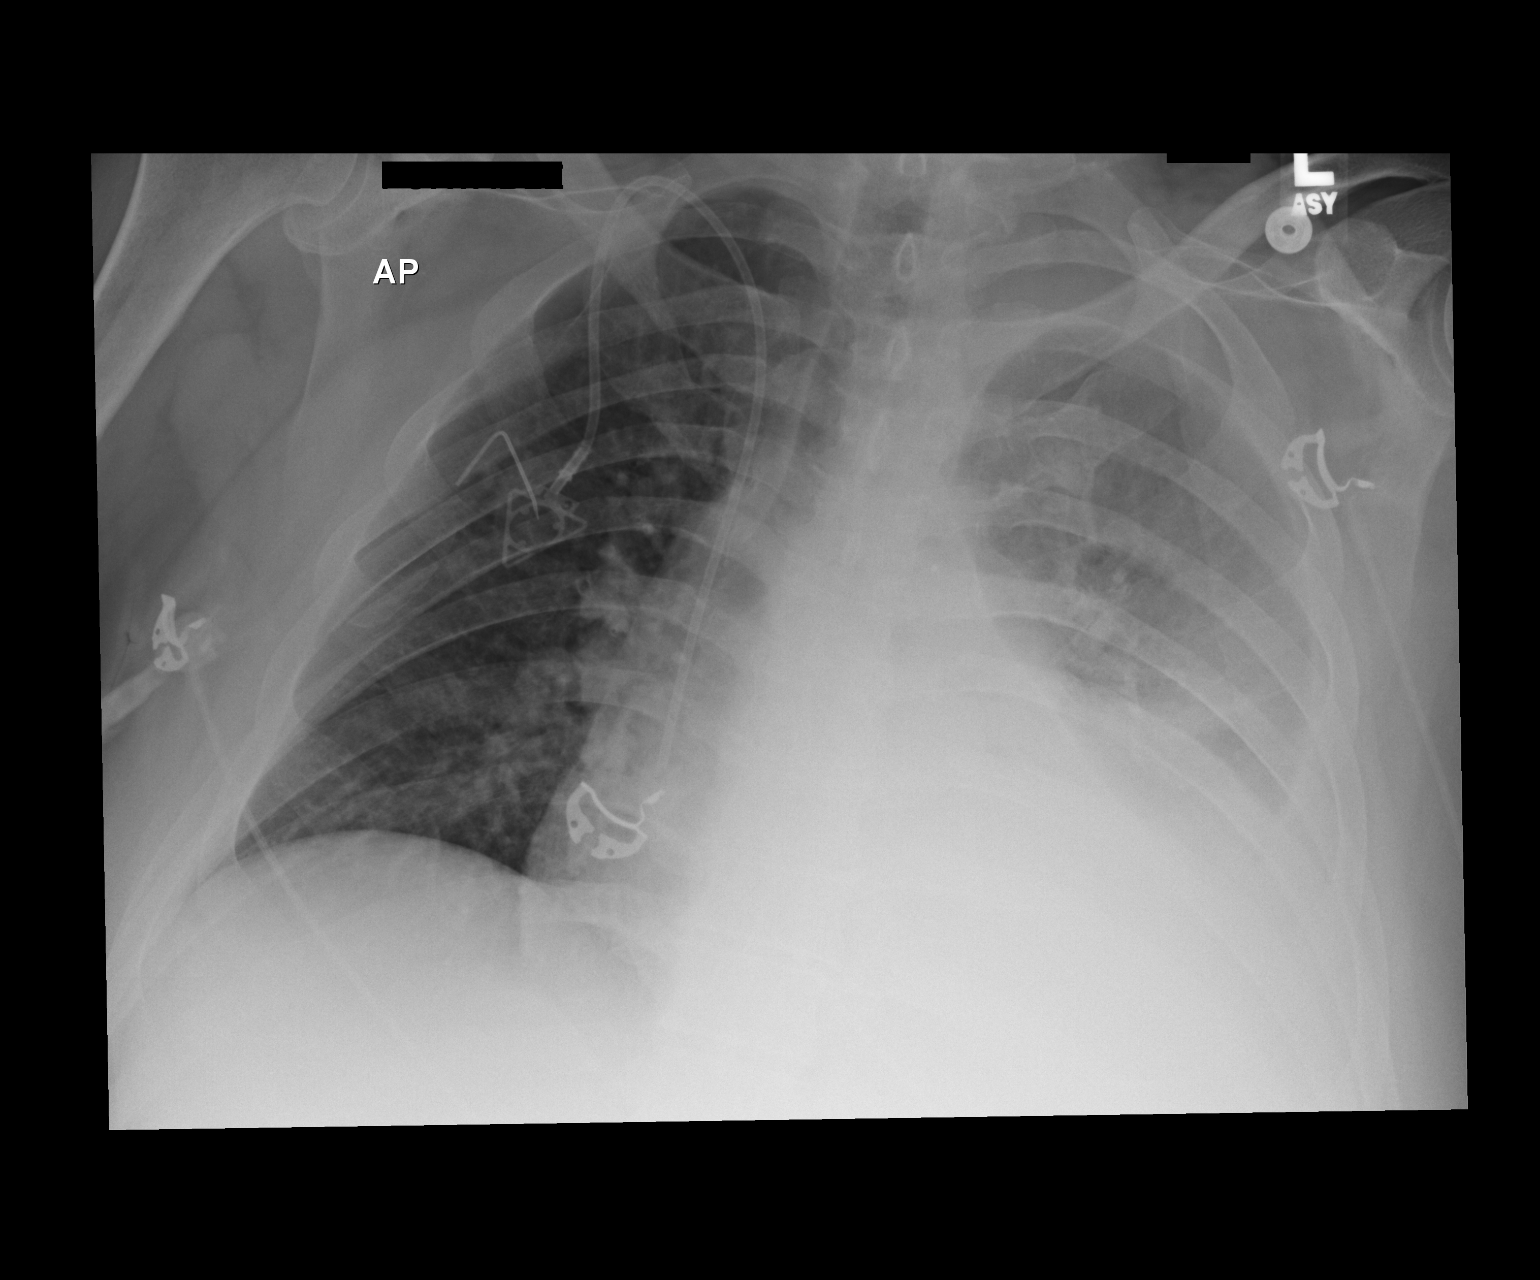

[1 of 1 positions shown; findings below may reference images not displayed]

FINDINGS: Decrease in size of the left pleural effusion. Hazy
appearance to the left aerated portion of the lung may correspond
to superimposed layering fluid in the pleural space or airspace
edema.  No pneumothorax visualized.  Cardiomegaly.  Right Port-A-
Cath with tip projecting over the cavoatrial junction.  No interval
osseous change.
IMPRESSION: Decreased size of the left pleural effusion status post
thoracentesis.

Hazy appearance to the left lung may reflect edema or superimposed
layering pleural fluid.  No pneumothorax.
